# Patient Record
Sex: Female | Born: 1950
Health system: Southern US, Community
[De-identification: ages and names within clinical notes are randomized; demographics above are authoritative.]

## PROBLEM LIST (undated history)

## (undated) DIAGNOSIS — F32A Depression, unspecified: Secondary | ICD-10-CM

## (undated) DIAGNOSIS — F329 Major depressive disorder, single episode, unspecified: Secondary | ICD-10-CM

## (undated) DIAGNOSIS — I10 Essential (primary) hypertension: Secondary | ICD-10-CM

## (undated) DIAGNOSIS — E669 Obesity, unspecified: Secondary | ICD-10-CM

## (undated) DIAGNOSIS — E785 Hyperlipidemia, unspecified: Secondary | ICD-10-CM

## (undated) HISTORY — DX: Depression, unspecified: F32.A

## (undated) HISTORY — DX: Hyperlipidemia, unspecified: E78.5

## (undated) HISTORY — DX: Obesity, unspecified: E66.9

## (undated) HISTORY — DX: Major depressive disorder, single episode, unspecified: F32.9

---

## 1999-07-14 ENCOUNTER — Other Ambulatory Visit: Admission: RE | Admit: 1999-07-14 | Discharge: 1999-07-14 | Payer: Self-pay | Admitting: Obstetrics and Gynecology

## 1999-08-04 ENCOUNTER — Encounter: Payer: Self-pay | Admitting: Obstetrics and Gynecology

## 1999-08-04 ENCOUNTER — Encounter: Admission: RE | Admit: 1999-08-04 | Discharge: 1999-08-04 | Payer: Self-pay | Admitting: Obstetrics and Gynecology

## 2007-02-24 ENCOUNTER — Emergency Department (HOSPITAL_COMMUNITY): Admission: EM | Admit: 2007-02-24 | Discharge: 2007-02-24 | Payer: Self-pay | Admitting: Family Medicine

## 2007-09-29 ENCOUNTER — Emergency Department (HOSPITAL_COMMUNITY): Admission: EM | Admit: 2007-09-29 | Discharge: 2007-09-29 | Payer: Self-pay | Admitting: Family Medicine

## 2007-12-02 ENCOUNTER — Ambulatory Visit: Payer: Self-pay | Admitting: Internal Medicine

## 2007-12-02 DIAGNOSIS — F329 Major depressive disorder, single episode, unspecified: Secondary | ICD-10-CM

## 2007-12-02 DIAGNOSIS — F32A Depression, unspecified: Secondary | ICD-10-CM | POA: Insufficient documentation

## 2007-12-08 ENCOUNTER — Telehealth: Payer: Self-pay | Admitting: Internal Medicine

## 2007-12-08 LAB — CONVERTED CEMR LAB
ALT: 17 units/L (ref 0–35)
AST: 18 units/L (ref 0–37)
BUN: 18 mg/dL (ref 6–23)
CO2: 29 meq/L (ref 19–32)
Calcium: 9.3 mg/dL (ref 8.4–10.5)
Chloride: 104 meq/L (ref 96–112)
Cholesterol: 188 mg/dL (ref 0–200)
Creatinine, Ser: 0.8 mg/dL (ref 0.4–1.2)
GFR calc Af Amer: 95 mL/min
GFR calc non Af Amer: 79 mL/min
Glucose, Bld: 124 mg/dL — ABNORMAL HIGH (ref 70–99)
HDL: 30.4 mg/dL — ABNORMAL LOW (ref >39.0)
Hgb A1c MFr Bld: 7 % — ABNORMAL HIGH (ref 4.6–6.0)
LDL Cholesterol: 123 mg/dL — ABNORMAL HIGH (ref 0–99)
Potassium: 4.6 meq/L (ref 3.5–5.1)
Sodium: 139 meq/L (ref 135–145)
Total CHOL/HDL Ratio: 6.2
Triglycerides: 174 mg/dL — ABNORMAL HIGH (ref 0–149)
VLDL: 35 mg/dL (ref 0–40)

## 2007-12-15 ENCOUNTER — Encounter: Payer: Self-pay | Admitting: Internal Medicine

## 2007-12-15 ENCOUNTER — Encounter: Admission: RE | Admit: 2007-12-15 | Discharge: 2007-12-23 | Payer: Self-pay | Admitting: Internal Medicine

## 2007-12-19 ENCOUNTER — Telehealth: Payer: Self-pay | Admitting: Internal Medicine

## 2008-02-25 ENCOUNTER — Emergency Department (HOSPITAL_COMMUNITY): Admission: EM | Admit: 2008-02-25 | Discharge: 2008-02-25 | Payer: Self-pay | Admitting: Family Medicine

## 2008-03-08 ENCOUNTER — Encounter: Payer: Self-pay | Admitting: Internal Medicine

## 2008-03-29 ENCOUNTER — Ambulatory Visit: Payer: Self-pay | Admitting: Internal Medicine

## 2008-03-29 LAB — CONVERTED CEMR LAB
ALT: 15 units/L (ref 0–35)
AST: 16 units/L (ref 0–37)
BUN: 19 mg/dL (ref 6–23)
CO2: 29 meq/L (ref 19–32)
Calcium: 9.1 mg/dL (ref 8.4–10.5)
Chloride: 106 meq/L (ref 96–112)
Cholesterol: 216 mg/dL (ref 0–200)
Creatinine, Ser: 1.1 mg/dL (ref 0.4–1.2)
Direct LDL: 148.9 mg/dL
GFR calc Af Amer: 66 mL/min
GFR calc non Af Amer: 54 mL/min
Glucose, Bld: 127 mg/dL — ABNORMAL HIGH (ref 70–99)
HDL: 36.2 mg/dL — ABNORMAL LOW (ref >39.0)
Hgb A1c MFr Bld: 6.5 % — ABNORMAL HIGH (ref 4.6–6.0)
Potassium: 4.8 meq/L (ref 3.5–5.1)
Sodium: 139 meq/L (ref 135–145)
Total CHOL/HDL Ratio: 6
Triglycerides: 107 mg/dL (ref 0–149)
VLDL: 21 mg/dL (ref 0–40)

## 2008-04-01 ENCOUNTER — Ambulatory Visit: Payer: Self-pay | Admitting: Internal Medicine

## 2008-08-09 ENCOUNTER — Ambulatory Visit: Payer: Self-pay | Admitting: Internal Medicine

## 2008-08-09 LAB — CONVERTED CEMR LAB
ALT: 17 units/L (ref 0–35)
AST: 18 units/L (ref 0–37)
Albumin: 3.6 g/dL (ref 3.5–5.2)
Alkaline Phosphatase: 67 units/L (ref 39–117)
BUN: 18 mg/dL (ref 6–23)
Bilirubin, Direct: 0.1 mg/dL (ref 0.0–0.3)
CO2: 30 meq/L (ref 19–32)
Calcium: 9 mg/dL (ref 8.4–10.5)
Chloride: 105 meq/L (ref 96–112)
Cholesterol: 216 mg/dL (ref 0–200)
Creatinine, Ser: 0.9 mg/dL (ref 0.4–1.2)
Direct LDL: 155.4 mg/dL
GFR calc Af Amer: 83 mL/min
GFR calc non Af Amer: 69 mL/min
Glucose, Bld: 150 mg/dL — ABNORMAL HIGH (ref 70–99)
HDL: 43.9 mg/dL (ref >39.0)
Hgb A1c MFr Bld: 6.4 % — ABNORMAL HIGH (ref 4.6–6.0)
Potassium: 4.3 meq/L (ref 3.5–5.1)
Sodium: 140 meq/L (ref 135–145)
Total Bilirubin: 0.6 mg/dL (ref 0.3–1.2)
Total CHOL/HDL Ratio: 4.9
Total Protein: 6.7 g/dL (ref 6.0–8.3)
Triglycerides: 110 mg/dL (ref 0–149)
VLDL: 22 mg/dL (ref 0–40)

## 2008-08-17 ENCOUNTER — Ambulatory Visit: Payer: Self-pay | Admitting: Internal Medicine

## 2008-08-24 ENCOUNTER — Encounter: Payer: Self-pay | Admitting: Internal Medicine

## 2008-08-24 ENCOUNTER — Telehealth: Payer: Self-pay | Admitting: Internal Medicine

## 2008-09-09 ENCOUNTER — Ambulatory Visit: Payer: Self-pay | Admitting: Internal Medicine

## 2008-09-10 LAB — CONVERTED CEMR LAB
CRP, High Sensitivity: 4 (ref 0.00–5.00)
Sed Rate: 17 mm/hr (ref 0–22)

## 2008-11-29 ENCOUNTER — Ambulatory Visit: Payer: Self-pay | Admitting: Internal Medicine

## 2008-11-29 LAB — CONVERTED CEMR LAB
ALT: 15 units/L (ref 0–35)
AST: 21 units/L (ref 0–37)
Albumin: 3.6 g/dL (ref 3.5–5.2)
Alkaline Phosphatase: 63 units/L (ref 39–117)
BUN: 11 mg/dL (ref 6–23)
Basophils Absolute: 0 10*3/uL (ref 0.0–0.1)
Basophils Relative: 0.8 % (ref 0.0–3.0)
Bilirubin Urine: NEGATIVE
Bilirubin, Direct: 0.1 mg/dL (ref 0.0–0.3)
Blood in Urine, dipstick: NEGATIVE
CO2: 27 meq/L (ref 19–32)
Calcium: 8.8 mg/dL (ref 8.4–10.5)
Chloride: 107 meq/L (ref 96–112)
Cholesterol: 194 mg/dL (ref 0–200)
Creatinine, Ser: 0.9 mg/dL (ref 0.4–1.2)
Creatinine,U: 69.8 mg/dL
Eosinophils Absolute: 0.2 10*3/uL (ref 0.0–0.7)
Eosinophils Relative: 3.9 % (ref 0.0–5.0)
GFR calc non Af Amer: 68.42 mL/min (ref >60)
Glucose, Bld: 146 mg/dL — ABNORMAL HIGH (ref 70–99)
Glucose, Urine, Semiquant: NEGATIVE
HCT: 39 % (ref 36.0–46.0)
HDL: 34.2 mg/dL — ABNORMAL LOW (ref >39.00)
Hemoglobin: 13.1 g/dL (ref 12.0–15.0)
Hgb A1c MFr Bld: 6.5 % (ref 4.6–6.5)
Ketones, urine, test strip: NEGATIVE
LDL Cholesterol: 133 mg/dL — ABNORMAL HIGH (ref 0–99)
Lymphocytes Relative: 40 % (ref 12.0–46.0)
Lymphs Abs: 2.3 10*3/uL (ref 0.7–4.0)
MCHC: 33.5 g/dL (ref 30.0–36.0)
MCV: 89.3 fL (ref 78.0–100.0)
Microalb Creat Ratio: 1.4 mg/g (ref 0.0–30.0)
Microalb, Ur: 0.1 mg/dL (ref 0.0–1.9)
Monocytes Absolute: 0.4 10*3/uL (ref 0.1–1.0)
Monocytes Relative: 6.6 % (ref 3.0–12.0)
Neutro Abs: 2.8 10*3/uL (ref 1.4–7.7)
Neutrophils Relative %: 48.7 % (ref 43.0–77.0)
Nitrite: NEGATIVE
Platelets: 216 10*3/uL (ref 150.0–400.0)
Potassium: 4.7 meq/L (ref 3.5–5.1)
Protein, U semiquant: NEGATIVE
RBC: 4.36 M/uL (ref 3.87–5.11)
RDW: 11.6 % (ref 11.5–14.6)
Sodium: 141 meq/L (ref 135–145)
Specific Gravity, Urine: 1.015
TSH: 2 microintl units/mL (ref 0.35–5.50)
Total Bilirubin: 0.8 mg/dL (ref 0.3–1.2)
Total CHOL/HDL Ratio: 6
Total Protein: 6.3 g/dL (ref 6.0–8.3)
Triglycerides: 135 mg/dL (ref 0.0–149.0)
Urobilinogen, UA: 0.2
VLDL: 27 mg/dL (ref 0.0–40.0)
WBC: 5.7 10*3/uL (ref 4.5–10.5)
pH: 5.5

## 2008-12-06 ENCOUNTER — Ambulatory Visit: Payer: Self-pay | Admitting: Internal Medicine

## 2009-02-21 ENCOUNTER — Emergency Department (HOSPITAL_COMMUNITY): Admission: EM | Admit: 2009-02-21 | Discharge: 2009-02-21 | Payer: Self-pay | Admitting: Family Medicine

## 2010-03-07 ENCOUNTER — Telehealth: Payer: Self-pay | Admitting: Internal Medicine

## 2010-03-16 ENCOUNTER — Ambulatory Visit: Payer: Self-pay | Admitting: Internal Medicine

## 2010-03-21 ENCOUNTER — Encounter: Payer: Self-pay | Admitting: Internal Medicine

## 2010-03-21 LAB — CONVERTED CEMR LAB
ALT: 16 units/L (ref 0–35)
AST: 14 units/L (ref 0–37)
Albumin: 3.6 g/dL (ref 3.5–5.2)
Alkaline Phosphatase: 75 units/L (ref 39–117)
BUN: 18 mg/dL (ref 6–23)
Basophils Absolute: 0.1 10*3/uL (ref 0.0–0.1)
Basophils Relative: 0.7 % (ref 0.0–3.0)
Bilirubin, Direct: 0.1 mg/dL (ref 0.0–0.3)
CO2: 29 meq/L (ref 19–32)
Calcium: 8.9 mg/dL (ref 8.4–10.5)
Chloride: 108 meq/L (ref 96–112)
Cholesterol: 233 mg/dL — ABNORMAL HIGH (ref 0–200)
Creatinine, Ser: 0.8 mg/dL (ref 0.4–1.2)
Direct LDL: 172.1 mg/dL
Eosinophils Absolute: 0.3 10*3/uL (ref 0.0–0.7)
Eosinophils Relative: 4.3 % (ref 0.0–5.0)
GFR calc non Af Amer: 73.76 mL/min (ref >60)
Glucose, Bld: 229 mg/dL — ABNORMAL HIGH (ref 70–99)
HCT: 37.8 % (ref 36.0–46.0)
HDL: 43.5 mg/dL (ref >39.00)
Hemoglobin: 13.2 g/dL (ref 12.0–15.0)
Hgb A1c MFr Bld: 8.4 % — ABNORMAL HIGH (ref 4.6–6.5)
Lymphocytes Relative: 35 % (ref 12.0–46.0)
Lymphs Abs: 2.6 10*3/uL (ref 0.7–4.0)
MCHC: 34.8 g/dL (ref 30.0–36.0)
MCV: 88.9 fL (ref 78.0–100.0)
Monocytes Absolute: 0.4 10*3/uL (ref 0.1–1.0)
Monocytes Relative: 5.6 % (ref 3.0–12.0)
Neutro Abs: 4.1 10*3/uL (ref 1.4–7.7)
Neutrophils Relative %: 54.4 % (ref 43.0–77.0)
Platelets: 210 10*3/uL (ref 150.0–400.0)
Potassium: 4.6 meq/L (ref 3.5–5.1)
RBC: 4.25 M/uL (ref 3.87–5.11)
RDW: 13.1 % (ref 11.5–14.6)
Sodium: 143 meq/L (ref 135–145)
TSH: 2.29 microintl units/mL (ref 0.35–5.50)
Total Bilirubin: 0.5 mg/dL (ref 0.3–1.2)
Total CHOL/HDL Ratio: 5
Total Protein: 6.4 g/dL (ref 6.0–8.3)
Triglycerides: 119 mg/dL (ref 0.0–149.0)
VLDL: 23.8 mg/dL (ref 0.0–40.0)
WBC: 7.4 10*3/uL (ref 4.5–10.5)

## 2010-03-27 ENCOUNTER — Encounter: Payer: Self-pay | Admitting: Internal Medicine

## 2010-03-27 ENCOUNTER — Encounter: Admission: RE | Admit: 2010-03-27 | Discharge: 2010-05-25 | Payer: Self-pay | Admitting: Internal Medicine

## 2010-03-30 ENCOUNTER — Encounter: Payer: Self-pay | Admitting: Internal Medicine

## 2010-07-05 ENCOUNTER — Encounter: Payer: Self-pay | Admitting: Family Medicine

## 2010-07-05 ENCOUNTER — Ambulatory Visit: Payer: Self-pay | Admitting: Anesthesiology

## 2010-07-26 ENCOUNTER — Ambulatory Visit: Payer: Self-pay | Admitting: Internal Medicine

## 2010-07-26 LAB — CONVERTED CEMR LAB
ALT: 19 units/L (ref 0–35)
AST: 20 units/L (ref 0–37)
Albumin: 3.8 g/dL (ref 3.5–5.2)
Alkaline Phosphatase: 75 units/L (ref 39–117)
BUN: 17 mg/dL (ref 6–23)
Bilirubin, Direct: 0.1 mg/dL (ref 0.0–0.3)
Blood Glucose, Fingerstick: 222
CO2: 26 meq/L (ref 19–32)
Calcium: 8.9 mg/dL (ref 8.4–10.5)
Chloride: 104 meq/L (ref 96–112)
Cholesterol: 212 mg/dL — ABNORMAL HIGH (ref 0–200)
Creatinine, Ser: 0.8 mg/dL (ref 0.4–1.2)
Direct LDL: 148.3 mg/dL
GFR calc non Af Amer: 74.7 mL/min (ref >60)
Glucose, Bld: 212 mg/dL — ABNORMAL HIGH (ref 70–99)
HDL: 42.2 mg/dL (ref >39.00)
Potassium: 4.5 meq/L (ref 3.5–5.1)
Sodium: 139 meq/L (ref 135–145)
TSH: 2.46 microintl units/mL (ref 0.35–5.50)
Total Bilirubin: 0.3 mg/dL (ref 0.3–1.2)
Total CHOL/HDL Ratio: 5
Total Protein: 6.7 g/dL (ref 6.0–8.3)
Triglycerides: 153 mg/dL — ABNORMAL HIGH (ref 0.0–149.0)
VLDL: 30.6 mg/dL (ref 0.0–40.0)

## 2010-08-11 ENCOUNTER — Telehealth: Payer: Self-pay | Admitting: Internal Medicine

## 2010-09-26 ENCOUNTER — Ambulatory Visit
Admission: RE | Admit: 2010-09-26 | Discharge: 2010-09-26 | Payer: Self-pay | Source: Home / Self Care | Attending: Internal Medicine | Admitting: Internal Medicine

## 2010-09-26 ENCOUNTER — Other Ambulatory Visit: Payer: Self-pay | Admitting: Internal Medicine

## 2010-09-26 LAB — HEPATIC FUNCTION PANEL
ALT: 23 U/L (ref 0–35)
AST: 19 U/L (ref 0–37)
Albumin: 3.7 g/dL (ref 3.5–5.2)
Total Bilirubin: 0.2 mg/dL — ABNORMAL LOW (ref 0.3–1.2)
Total Protein: 6.6 g/dL (ref 6.0–8.3)

## 2010-09-26 LAB — BASIC METABOLIC PANEL
BUN: 18 mg/dL (ref 6–23)
Calcium: 8.7 mg/dL (ref 8.4–10.5)
GFR: 76.78 mL/min (ref >60.00)
Glucose, Bld: 315 mg/dL — ABNORMAL HIGH (ref 70–99)

## 2010-09-26 LAB — CBC WITH DIFFERENTIAL/PLATELET
Basophils Absolute: 0 10*3/uL (ref 0.0–0.1)
HCT: 39.6 % (ref 36.0–46.0)
Lymphs Abs: 3.4 10*3/uL (ref 0.7–4.0)
Monocytes Relative: 5.4 % (ref 3.0–12.0)
Platelets: 199 10*3/uL (ref 150.0–400.0)
RDW: 12.9 % (ref 11.5–14.6)

## 2010-09-26 NOTE — Progress Notes (Signed)
Summary: Pt req refill of Citalopram Hydrobromide 20mg . Has ov 03/16/10`  Phone Note Refill Request Call back at Home Phone [phone] Call back at [phone] Work Message from:  Patient on March 07, 2010 1:47 PM  Refills Requested: Medication #1:  CITALOPRAM HYDROBROMIDE 20 MG TABS take one tablet by mouth daily--NEEDS OFFICE VISIT FOR ADDITIONAL REFILLS   Dosage confirmed as above?Dosage Confirmed   Supply Requested: 3 months Pls call in to [facility] [phone]       Method Requested: Telephone to Pharmacy Next Appointment Scheduled: 03/16/10 Initial call taken by: Lucy Antigua,  March 07, 2010 1:49 PM  Follow-up for Phone Call        Please advise refill? Last refill request stated no refills until office visit? Follow-up by: Josph Macho RMA,  March 07, 2010 2:21 PM  Additional Follow-up for Phone Call Additional follow up Details #1::        needs OV can give #14.  The office visit will be to address this refill only Additional Follow-up by: Birdie Sons MD,  March 07, 2010 2:27 PM    Prescriptions: CITALOPRAM HYDROBROMIDE 20 MG TABS (CITALOPRAM HYDROBROMIDE) take one tablet by mouth daily--NEEDS OFFICE VISIT FOR ADDITIONAL REFILLS  #14 x 0   Entered by:   Josph Macho RMA   Authorized by:   Birdie Sons MD   Signed by:   Josph Macho RMA on 03/07/2010   Method used:   Faxed to ...       [facility] (retail)       [address] [place], [place]  [address]       Ph: [phone]       Fax: [phone]   RxID:   [id number]   Appended Document: Pt req refill of Citalopram Hydrobromide 20mg . Has ov 03/16/10` Informed pt that #14 was sent to pharmacy.

## 2010-09-26 NOTE — Assessment & Plan Note (Signed)
Summary: med check and refill/pt coming in fasting/cjr   Vital Signs:  Patient profile:   60 year old female Weight:      219 pounds BMI:     33.42 Temp:     98.6 degrees F oral Pulse rate:   72 / minute Pulse rhythm:   regular Resp:     12 per minute BP sitting:   140 / 78  (left arm) Cuff size:   regular  Vitals Entered By: Gladis Riffle, RN (March 16, 2010 8:36 AM) CC: med review and refil, fasting--CBGs higher in AM 180 at home Is Patient Diabetic? Yes   CC:  med review and refil and fasting--CBGs higher in AM 180 at home.  History of Present Illness:  Follow-Up Visit: not seen in quite some time---needs f/u and refills      This is a 60 year old woman who presents for Follow-up visit.  The patient denies chest pain, palpitations, edema, and SOB.  Since the last visit the patient notes no new problems or concerns.  The patient reports taking meds as prescribed.  When questioned about possible medication side effects, the patient notes none.  She admits to overeating--has gained weight  All other systems reviewed and were negative   Preventive Screening-Counseling & Management  Alcohol-Tobacco     Alcohol drinks/day: <1     Smoking Status: quit     Year Quit: 1975  Current Medications (verified): 1)  Citalopram Hydrobromide 20 Mg Tabs (Citalopram Hydrobromide) .... Take One Tablet By Mouth Daily 2)  Flax Seed Oil 1000 Mg Caps (Flaxseed (Linseed)) .... Once Daily 3)  Freestyle Lite Test   Strp (Glucose Blood) .... Use Once Daily As Directed  Allergies (verified): No Known Drug Allergies  Past History:  Past Medical History: Last updated: 12/02/2007 Depression Diabetes mellitus, type II Hyperlipidemia  Past Surgical History: Last updated: 12/02/2007 Denies surgical history  Family History: Last updated: 12/02/2007 Family History Diabetes 1st degree relative-mother  mother alive at 26  Family History of Suicide attempt-father deceased-suicide  Social  History: Last updated: 12/02/2007 Occupation: Divorced x 3 Former Smoker Alcohol use-yes Regular exercise-yes  Risk Factors: Alcohol Use: <1 (03/16/2010) Exercise: yes (12/02/2007)  Risk Factors: Smoking Status: quit (03/16/2010)  Physical Exam  General:  alert and well-developed.   Head:  atraumatic and no abnormalities observed.   Eyes:  pupils equal and pupils round.   Ears:  R ear normal and L ear normal.   Neck:  No deformities, masses, or tenderness noted. Chest Wall:  No deformities, masses, or tenderness noted. Lungs:  normal respiratory effort and no intercostal retractions.   Heart:  normal rate and regular rhythm.   Abdomen:  Bowel sounds positive,abdomen soft and non-tender without masses, organomegaly or hernias noted.  overwight Msk:  No deformity or scoliosis noted of thoracic or lumbar spine.   Neurologic:  cranial nerves II-XII intact and gait normal.   Skin:  turgor normal and color normal.   Psych:  normally interactive and good eye contact.    Diabetes Management Exam:    Eye Exam:       Eye Exam done elsewhere          Date: 02/24/2010          Results: normal-pt's report          Done by: ophthal   Impression & Recommendations:  Problem # 1:  HYPERLIPIDEMIA (ICD-272.4) check labs today Labs Reviewed: SGOT: 21 (11/29/2008)   SGPT: 15 (11/29/2008)  HDL:34.20 (11/29/2008), 43.9 (08/09/2008)  LDL:133 (11/29/2008), DEL (08/09/2008)  Chol:194 (11/29/2008), 216 (08/09/2008)  Trig:135.0 (11/29/2008), 110 (08/09/2008)  Orders: TLB-Lipid Panel (80061-LIPID) TLB-Hepatic/Liver Function Pnl (80076-HEPATIC) Venipuncture (16[address])  Problem # 2:  DIABETES MELLITUS, TYPE II (ICD-250.00) admits to some blurred vision Labs Reviewed: Creat: 0.9 (11/29/2008)     Last Eye Exam: normal-pt's report (02/24/2010) Reviewed HgBA1c results: 6.5 (11/29/2008)  6.4 (08/09/2008)  Orders: TLB-BMP (Basic Metabolic Panel-BMET) (80048-METABOL) TLB-TSH (Thyroid  Stimulating Hormone) (84443-TSH) TLB-CBC Platelet - w/Differential (85025-CBCD) TLB-A1C / Hgb A1C (Glycohemoglobin) (83036-A1C)  Problem # 3:  DEPRESSION (ICD-311) continue meds Her updated medication list for this problem includes:    Citalopram Hydrobromide 20 Mg Tabs (Citalopram hydrobromide) .Marland Kitchen... Take one tablet by mouth daily  Complete Medication List: 1)  Citalopram Hydrobromide 20 Mg Tabs (Citalopram hydrobromide) .... Take one tablet by mouth daily 2)  Flax Seed Oil 1000 Mg Caps (Flaxseed (linseed)) .... Once daily 3)  Freestyle Lite Test Strp (Glucose blood) .... Use once daily as directed  Other Orders: Ophthalmology Referral (Ophthalmology)  Contraindications/Deferment of Procedures/Staging:    Test/Procedure: TD vaccine    Reason for deferment: declined     Test/Procedure: PAP Smear    Reason for deferment: patient declined     Test/Procedure: Mammogram    Reason for deferment: patient declined   Patient Instructions: 1)  Please schedule a follow-up appointment in 4 months. Prescriptions: CITALOPRAM HYDROBROMIDE 20 MG TABS (CITALOPRAM HYDROBROMIDE) take one tablet by mouth daily  #90 x 3   Entered and Authorized by:   Birdie Sons MD   Signed by:   Birdie Sons MD on 03/16/2010   Method used:   Electronically to        [facility]* (retail)       [address]       [address] Shipping/mailing       [place], [place]  [address]       Ph: [phone]       Fax: [phone]   RxID:   [id number]

## 2010-09-26 NOTE — Letter (Signed)
Summary: [facility]  [facility]   Imported By: Maryln Gottron 07/12/2010 11:03:59  _____________________________________________________________________  External Attachment:    Type:   Image     Comment:   External Document

## 2010-09-26 NOTE — Assessment & Plan Note (Signed)
Summary: FUP//CCM   Vital Signs:  Patient profile:   60 year old female Weight:      215 pounds Temp:     98.8 degrees F oral Pulse rate:   92 / minute Pulse rhythm:   regular BP sitting:   140 / 80  (left arm) Cuff size:   large  Vitals Entered By: Alfred Levins, CMA (July 26, 2010 8:18 AM)  Serial Vital Signs/Assessments:  Time      Position  BP       Pulse  Resp  Temp     By                     132/80                         Birdie Sons MD  CC: diabetes f/u CBG Result 222   CC:  diabetes f/u.  History of Present Illness:  Follow-Up Visit      This is a 60 year old woman who presents for Follow-up visit.  The patient denies chest pain and palpitations.  Since the last visit the patient notes no new problems or concerns.  The patient reports taking meds as prescribed, not monitoring BP, and not monitoring blood sugars.  When questioned about possible medication side effects, the patient notes none.   she has lost a few pounds but admits to a poor diet.   All other systems reviewed and were negative except she admits to a great deal of stress.  Current Problems (verified): 1)  Iritis  (ICD-364.3) 2)  Hyperlipidemia  (ICD-272.4) 3)  Diabetes Mellitus, Type II  (ICD-250.00) 4)  Depression  (ICD-311)  Current Medications (verified): 1)  Citalopram Hydrobromide 20 Mg Tabs (Citalopram Hydrobromide) .... Take One Tablet By Mouth Daily 2)  Freestyle Lite Test   Strp (Glucose Blood) .... Use Once Daily As Directed 3)  Sensa Complete .... Once Daily 4)  Cyanocobalamin 2500 Mcg Subl (Cyanocobalamin) .... Once Daily  Allergies (verified): No Known Drug Allergies  Past History:  Past Medical History: Depression Diabetes mellitus, type II Hyperlipidemia Hypertension  Physical Exam  General:  overweight female in no acute distress. HEENT exam atraumatic, normocephalic. Extraocular muscles intact. Neck is supple without lymphadenopathy. Chest clear to auscultation  cardiac exam S1-S2 are regular. Abdominal exam overweight, to bowel sounds, soft and nontender. No hepatomegaly. Extremities no clubbing cyanosis or edema neurologic exam alert and oriented gait is normal.   Impression & Recommendations:  Problem # 1:  HYPERLIPIDEMIA (ICD-272.4) she will certainly need treatment. I'll check laboratories today. She understands the side effects of statin drugs. I will likely [place] tomorrow. Orders: Fingerstick (14782) TLB-Lipid Panel (80061-LIPID) TLB-BMP (Basic Metabolic Panel-BMET) (80048-METABOL) TLB-Hepatic/Liver Function Pnl (80076-HEPATIC) TLB-TSH (Thyroid Stimulating Hormone) (84443-TSH)  Problem # 2:  DIABETES MELLITUS, TYPE II (ICD-250.00) we'll check lab work today. Look sugar in the office today was 222. She'll most or any metformin. She understands the side effects. I'll call that inaccuracy the laboratory. Orders: Capillary Blood Glucose/CBG (82948) Fingerstick (36416) TLB-Lipid Panel (80061-LIPID) TLB-BMP (Basic Metabolic Panel-BMET) (80048-METABOL) TLB-Hepatic/Liver Function Pnl (80076-HEPATIC) TLB-TSH (Thyroid Stimulating Hormone) (84443-TSH)  Problem # 3:  IRITIS (ICD-364.3) regular f/u with ophthalmology.   Problem # 4:  HYPERTENSION (ICD-401.9) Assessment: New discussed with patient. She needs to lose weight. She is to monitor blood pressure. She may end up on blood pressure medication as well. I think all of her issues would resolve with aggressive  weight loss.  Complete Medication List: 1)  Sertraline Hcl 100 Mg Tabs (Sertraline hcl) .Marland Kitchen.. 1 by mouth daily 2)  Freestyle Lite Test Strp (Glucose blood) .... Use once daily as directed 3)  Sensa Complete  .... Once daily 4)  Cyanocobalamin 2500 Mcg Subl (Cyanocobalamin) .... Once daily  Patient Instructions: 1)  Please schedule a follow-up appointment in 4 months. Prescriptions: SERTRALINE HCL 100 MG TABS (SERTRALINE HCL) 1 by mouth daily  #90 x 3   Entered and Authorized by:    Birdie Sons MD   Signed by:   Birdie Sons MD on 07/26/2010   Method used:   Electronically to        [facility]* (retail)       [address]       [address] Shipping/mailing       [place], [place]  [address]       Ph: [phone]       Fax: [phone]   RxID:   [id number]    Orders Added: 1)  Capillary Blood Glucose/CBG [82948] 2)  Est. Patient Level IV [20254] 3)  Fingerstick [36416] 4)  TLB-Lipid Panel [80061-LIPID] 5)  TLB-BMP (Basic Metabolic Panel-BMET) [80048-METABOL] 6)  TLB-Hepatic/Liver Function Pnl [80076-HEPATIC] 7)  TLB-TSH (Thyroid Stimulating Hormone) [27062-BJS]

## 2010-09-26 NOTE — Assessment & Plan Note (Signed)
Summary: 4 MONTH ROV/NJR   Vital Signs:  Patient profile:   60 year old female Weight:      215 pounds Temp:     99.0 degrees F oral Pulse rate:   100 / minute BP sitting:   134 / 74  (left arm) Cuff size:   large  Vitals Entered By: Alfred Levins, CMA (July 05, 2010 8:03 AM) CC: diabetes f/u, URI symptoms   History of Present Illness:       This is a 60 year old woman who was here to see Dr. Cato Mulligan for DM f/u but Dr. Cato Mulligan is out of office this am currently.  She presents with URI symptoms for the last 4d, worsening in the last 24h.  The patient complains of nasal congestion, purulent nasal discharge, and sore throat, but denies earache and sick contacts.  The patient denies fever.  Risk factors for Strep sinusitis include unilateral facial pain, unilateral nasal discharge, and poor response to decongestant.    Preventive Screening-Counseling & Management  Alcohol-Tobacco     Smoking Status: never  Problems Prior to Update: 1)  Blurred Vision  (ICD-368.8) 2)  Iritis  (ICD-364.3) 3)  Hyperlipidemia  (ICD-272.4) 4)  Diabetes Mellitus, Type II  (ICD-250.00) 5)  Depression  (ICD-311)  Medications Prior to Update: 1)  Citalopram Hydrobromide 20 Mg Tabs (Citalopram Hydrobromide) .... Take One Tablet By Mouth Daily 2)  Flax Seed Oil 1000 Mg Caps (Flaxseed (Linseed)) .... Once Daily 3)  Freestyle Lite Test   Strp (Glucose Blood) .... Use Once Daily As Directed  Current Medications (verified): 1)  Citalopram Hydrobromide 20 Mg Tabs (Citalopram Hydrobromide) .... Take One Tablet By Mouth Daily 2)  Freestyle Lite Test   Strp (Glucose Blood) .... Use Once Daily As Directed  Allergies (verified): No Known Drug Allergies  Past History:  Past Medical History: Last updated: 12/02/2007 Depression Diabetes mellitus, type II Hyperlipidemia  Past Surgical History: Last updated: 12/02/2007 Denies surgical history  Family History: Last updated: 12/02/2007 Family History  Diabetes 1st degree relative-mother  mother alive at 53  Family History of Suicide attempt-father deceased-suicide  Social History: Last updated: 12/02/2007 Occupation: Divorced x 3 Former Smoker Alcohol use-yes Regular exercise-yes  Social History: Smoking Status:  never  Review of Systems       no SOB, wheeze, or CP.  Otherwise, see HPI.  Physical Exam  General:  VS: noted, all normal. Gen: Alert, well appearing, oriented x 4. HEENT: Scalp without lesions or hair loss.  Ears: EACs clear, normal epithelium.  TMs with good light reflex and landmarks bilaterally.  Eyes: no injection, icteris, swelling, or exudate.  EOMI, PERRLA. Nose: injected mucosa, purulent d/c on left.  Left sided facial swelling-subtle.  Left sided maxillary sinus TTP.   Mouth: lips without lesion/swelling.  Oral mucosa pink and moist.  Dentition intact and without obvious caries or gingival swelling.  Oropharynx without erythema, exudate, or swelling.  Some PND is noted. Neck: supple.  No lymphadenopathy, thyromegaly, or mass.  Mild TTP diffusely in anterior neck. Chest: symmetric expansion, with nonlabored respirations.  Clear and equal breath sounds in all lung fields.   CV: RRR, no m/r/g.  Peripheral pulses 2+/symmetric. EXT: no clubbing, cyanosis, or edema.     Impression & Recommendations:  Problem # 1:  ACUTE SINUSITIS, UNSPECIFIED (ICD-461.9) Assessment New  She has atypical URI symptoms with distinct left maxillary TTP and purulent d/c in left nostril. Will give augmentin 875mg  two times a day x 10d,  flonase, and she'll get OTC antihistamine and continue neti pot. Asked to f/u for this as needed.  She will reschedule her DM f/u visit with Dr. Cato Mulligan.  Her updated medication list for this problem includes:    Augmentin 875-125 Mg Tabs (Amoxicillin-pot clavulanate) .Marland Kitchen... 1 tab by mouth two times a day x 10d    Nasonex 50 Mcg/act Susp (Mometasone furoate) .Marland Kitchen... 1-2 sprays each nostril once  daily  Problem # 2:  DIABETES MELLITUS, TYPE II (ICD-250.00) Assessment: Comment Only Diet controlled.  Not discussed today.  Patient will reschedule her f/u appt for this problem with her primary MD, Dr. Cato Mulligan.  Medications Added to Medication List This Visit: 1)  Augmentin 875-125 Mg Tabs (Amoxicillin-pot clavulanate) .Marland Kitchen.. 1 tab by mouth two times a day x 10d 2)  Nasonex 50 Mcg/act Susp (Mometasone furoate) .Marland Kitchen.. 1-2 sprays each nostril once daily  Complete Medication List: 1)  Citalopram Hydrobromide 20 Mg Tabs (Citalopram hydrobromide) .... Take one tablet by mouth daily 2)  Freestyle Lite Test Strp (Glucose blood) .... Use once daily as directed 3)  Augmentin 875-125 Mg Tabs (Amoxicillin-pot clavulanate) .Marland Kitchen.. 1 tab by mouth two times a day x 10d 4)  Nasonex 50 Mcg/act Susp (Mometasone furoate) .Marland Kitchen.. 1-2 sprays each nostril once daily  Patient Instructions: 1)  Please reschedule an appointment with your primary doctor in : 5-7d.  2)  Acute sinusitis symptoms for less than 10 days are not helped by antibiotics.Use warm moist compresses, and over the counter decongestants ( only as directed). Call if no improvement in 5-7 days, sooner if increasing pain, fever, or new symptoms. Prescriptions: NASONEX 50 MCG/ACT SUSP (MOMETASONE FUROATE) 1-2 sprays each nostril once daily  #1 x 1   Entered by:   Michell Heinrich M.D.   Authorized by:   Birdie Sons MD   Signed by:   Michell Heinrich M.D. on 07/05/2010   Method used:   Print then Give to Patient   RxID:   [id number] AUGMENTIN 875-125 MG TABS (AMOXICILLIN-POT CLAVULANATE) 1 tab by mouth two times a day x 10d  #20 x 0   Entered by:   Michell Heinrich M.D.   Authorized by:   Birdie Sons MD   Signed by:   Michell Heinrich M.D. on 07/05/2010   Method used:   Print then Give to Patient   RxID:   [id number]    Orders Added: 1)  Est. Patient Level III [84696]

## 2010-09-26 NOTE — Miscellaneous (Signed)
Summary: Orders Update  Clinical Lists Changes  Orders: Added new Referral order of Nutrition Referral (Nutrition) - Signed 

## 2010-09-27 ENCOUNTER — Other Ambulatory Visit: Payer: Self-pay | Admitting: Internal Medicine

## 2010-09-28 ENCOUNTER — Telehealth: Payer: Self-pay | Admitting: *Deleted

## 2010-09-28 DIAGNOSIS — E119 Type 2 diabetes mellitus without complications: Secondary | ICD-10-CM

## 2010-09-28 NOTE — Progress Notes (Signed)
  Phone Note Call from Patient Call back at Home Phone [phone]   Caller: Patient Call For: Birdie Sons MD Summary of Call: Is asking if Dr. Cato Mulligan meant to put her on Metformin? Initial call taken by: [facility] CMA AAMA,  August 11, 2010 4:30 PM  Follow-up for Phone Call        yes Follow-up by: Birdie Sons MD,  August 13, 2010 6:44 PM  Additional Follow-up for Phone Call Additional follow up Details #1::        [facility]...Marland KitchenMarland KitchenMarland Kitchenneeds instructions, please. Additional Follow-up by: Lynann Beaver CMA AAMA,  August 14, 2010 8:33 AM    New/Updated Medications: METFORMIN HCL 500 MG TABS (METFORMIN HCL) Take 1 tab twice daily Prescriptions: METFORMIN HCL 500 MG TABS (METFORMIN HCL) Take 1 tab twice daily  #180 x 3   Entered by:   Lynann Beaver CMA AAMA   Authorized by:   Birdie Sons MD   Signed by:   Lynann Beaver CMA AAMA on 08/14/2010   Method used:   Electronically to        [facility]* (retail)       [address]       [address] Shipping/mailing       [place], [place]  [address]       Ph: [phone]       Fax: [phone]   RxID:   [id number] METFORMIN HCL 500 MG TABS (METFORMIN HCL) Take 1 tab twice daily  #180 x 3   Entered and Authorized by:   Birdie Sons MD   Signed by:   Birdie Sons MD on 08/14/2010   Method used:   Print then Give to Patient   RxID:   [id number]

## 2010-09-28 NOTE — Telephone Encounter (Signed)
Pt said metformin makes her lips swell.

## 2010-09-29 MED ORDER — GLYBURIDE 5 MG PO TABS: 5.0000 mg | ORAL_TABLET | Freq: Every day | ORAL | Status: AC

## 2010-09-29 NOTE — Telephone Encounter (Signed)
Pt aware, rx called in  

## 2010-09-29 NOTE — Telephone Encounter (Signed)
Instead take glyburide 5 mg by mouth every morning with breakfast.

## 2010-09-29 NOTE — Letter (Signed)
[facility]   Imported By: Maryln Gottron 06/07/2010 11:25:43  _____________________________________________________________________  External Attachment:    Type:   Image     Comment:   External Document

## 2010-10-04 NOTE — Assessment & Plan Note (Signed)
Summary: pain in abdomen/cjr   Vital Signs:  Patient profile:   60 year old female Weight:      213 pounds Temp:     99.4 degrees F oral Pulse rate:   92 / minute BP sitting:   130 / 80  (left arm) Cuff size:   large  Vitals Entered By: Alfred Levins, CMA (September 26, 2010 11:42 AM) CC: abd pain, stressed out   CC:  abd pain and stressed out.  History of Present Illness: main complaint is "stressed out" she has been given name of susan bond to make appt she has been self medicating---mixing and matching sertraline and citalopram  abdominal pain for two days. diffuse and mild. no fever or chills, normal appetite and normal BMs . no nausea  ROS: anxious and worried, no other specific complaints  Allergies (verified): No Known Drug Allergies  Physical Exam  General:  alert and well-developed.   Head:  normocephalic and atraumatic.   Eyes:  pupils equal and pupils round.  nonicteric Ears:  R ear normal and L ear normal.   Neck:  No deformities, masses, or tenderness noted. Lungs:  normal respiratory effort and no intercostal retractions.   Heart:  normal rate and regular rhythm.   Abdomen:  soft, non-tender, and normal bowel sounds.     Impression & Recommendations:  Problem # 1:  ABDOMINAL PAIN (ICD-789.00) will check labs she does not have an acute abdomen Orders: Venipuncture (32951) Specimen Handling (88416) TLB-BMP (Basic Metabolic Panel-BMET) (80048-METABOL) TLB-CBC Platelet - w/Differential (85025-CBCD) TLB-Hepatic/Liver Function Pnl (80076-HEPATIC)  Problem # 2:  DEPRESSION (ICD-311) discussed at length advised her to never self medicate d/c citalopram increease sertraline see me 4 weeks The following medications were removed from the medication list:    Citalopram Hydrobromide 20 Mg Tabs (Citalopram hydrobromide) .Marland Kitchen... Take 1/2  tablet by mouth daily (self medicating) Her updated medication list for this problem includes:    Sertraline Hcl 100 Mg  Tabs (Sertraline hcl) .Marland Kitchen... 1 and 1/2  by mouth daily  Complete Medication List: 1)  Sertraline Hcl 100 Mg Tabs (Sertraline hcl) .Marland Kitchen.. 1 and 1/2  by mouth daily 2)  Freestyle Lite Test Strp (Glucose blood) .... Use once daily as directed 3)  Cyanocobalamin 2500 Mcg Subl (Cyanocobalamin) .... Once daily  Patient Instructions: 1)  Please schedule a follow-up appointment in 1 month. Prescriptions: SERTRALINE HCL 100 MG TABS (SERTRALINE HCL) 1 and 1/2  by mouth daily  #135 x 3   Entered and Authorized by:   Birdie Sons MD   Signed by:   Birdie Sons MD on 09/26/2010   Method used:   Electronically to        [facility]* (retail)       [address]       [address] Shipping/mailing       [place], [place]  [address]       Ph: [phone]       Fax: [phone]   RxID:   [id number] SERTRALINE HCL 100 MG TABS (SERTRALINE HCL) 1 and 1/2  by mouth daily  #135 x 3   Entered and Authorized by:   Birdie Sons MD   Signed by:   Birdie Sons MD on 09/26/2010   Method used:   Print then Give to Patient   RxID:   [id number] CITALOPRAM HYDROBROMIDE 20 MG TABS (CITALOPRAM HYDROBROMIDE) take 1/2  tablet by mouth daily (self medicating)  #90 x 3  Entered and Authorized by:   Birdie Sons MD   Signed by:   Birdie Sons MD on 09/26/2010   Method used:   Print then Give to Patient   RxID:   [id number]  note she was not given citalopram Rx---this was input as a medication she was taking at the time of home med rec  Orders Added: 1)  Est. Patient Level IV [14782] 2)  Venipuncture [95621] 3)  Specimen Handling [99000] 4)  TLB-BMP (Basic Metabolic Panel-BMET) [80048-METABOL] 5)  TLB-CBC Platelet - w/Differential [85025-CBCD] 6)  TLB-Hepatic/Liver Function Pnl [80076-HEPATIC]

## 2010-10-17 ENCOUNTER — Ambulatory Visit (INDEPENDENT_AMBULATORY_CARE_PROVIDER_SITE_OTHER): Payer: 59 | Admitting: Licensed Clinical Social Worker

## 2010-10-17 ENCOUNTER — Other Ambulatory Visit: Payer: Self-pay | Admitting: *Deleted

## 2010-10-17 DIAGNOSIS — F4323 Adjustment disorder with mixed anxiety and depressed mood: Secondary | ICD-10-CM

## 2010-10-17 DIAGNOSIS — F329 Major depressive disorder, single episode, unspecified: Secondary | ICD-10-CM

## 2010-10-17 MED ORDER — SERTRALINE HCL 100 MG PO TABS: 100.0000 mg | ORAL_TABLET | Freq: Every day | ORAL | Status: DC

## 2010-11-01 ENCOUNTER — Ambulatory Visit (INDEPENDENT_AMBULATORY_CARE_PROVIDER_SITE_OTHER): Payer: 59 | Admitting: Licensed Clinical Social Worker

## 2010-11-01 DIAGNOSIS — F4323 Adjustment disorder with mixed anxiety and depressed mood: Secondary | ICD-10-CM

## 2011-01-04 ENCOUNTER — Other Ambulatory Visit: Payer: Self-pay | Admitting: *Deleted

## 2011-01-04 MED ORDER — GLUCOSE BLOOD VI STRP: ORAL_STRIP | Status: DC

## 2011-02-21 ENCOUNTER — Inpatient Hospital Stay (INDEPENDENT_AMBULATORY_CARE_PROVIDER_SITE_OTHER)
Admission: RE | Admit: 2011-02-21 | Discharge: 2011-02-21 | Disposition: A | Discharge status: Home / Self Care | Payer: 59 | Source: Ambulatory Visit | Attending: Emergency Medicine | Admitting: Emergency Medicine

## 2011-02-21 DIAGNOSIS — K089 Disorder of teeth and supporting structures, unspecified: Secondary | ICD-10-CM

## 2011-02-26 ENCOUNTER — Encounter: Payer: 59 | Attending: Internal Medicine | Admitting: *Deleted

## 2011-02-26 DIAGNOSIS — Z713 Dietary counseling and surveillance: Secondary | ICD-10-CM | POA: Insufficient documentation

## 2011-02-26 DIAGNOSIS — E119 Type 2 diabetes mellitus without complications: Secondary | ICD-10-CM | POA: Insufficient documentation

## 2011-03-14 ENCOUNTER — Other Ambulatory Visit: Payer: Self-pay | Admitting: *Deleted

## 2011-03-14 MED ORDER — GLUCOSE BLOOD VI STRP: ORAL_STRIP | Status: DC

## 2011-04-02 ENCOUNTER — Encounter: Payer: 59 | Attending: Internal Medicine | Admitting: *Deleted

## 2011-04-02 DIAGNOSIS — E119 Type 2 diabetes mellitus without complications: Secondary | ICD-10-CM | POA: Insufficient documentation

## 2011-04-02 DIAGNOSIS — Z713 Dietary counseling and surveillance: Secondary | ICD-10-CM | POA: Insufficient documentation

## 2011-04-02 NOTE — Progress Notes (Signed)
Medical Nutrition Therapy:  Appt start time:  4:00p  End time:  4:30p.  Assessment:  Primary concerns today: T2DM, Weight Mgmt: Follow up.  Pt here for follow up. Improved FBGs and has lost ~6 lbs since last visit (7/2). Previously 300-400 mg/dL; last week 469-629.  Pt still skipping meals some days and not meeting calorie intake, but making some better choices. Reports appt with CDE at MD office Bonita Quin), who set specific protein recs (14g lunch/21g supper) and instructed pt to talk to MD Lucianne Muss) regarding adding Byetta or Victoza in medicine regimen. Pt to meet with MD on 05/09/11.    MEDICATIONS: B12, Glyburide, Zoloft, prednisoLONE acetate (eye drops), Estroven nutritional supplement   DIETARY INTAKE:  B (10:45 AM): SKIPS or 2 boiled eggs, grits; water S: None L (2:30 PM): 2 veggie dogs w/ buns; water OR [place] steak, mashed potatoes, green beans; water S: Frozen berries, kale chips D (8:45 PM): Burgers and greens   S: Ice pop  Recent physical activity:  No structured exercise noted - "I'm joining the gym today".  Estimated energy needs: 1600 calories 175-180 g carbohydrates (45g at meals and 15g at snacks) 115-120 g protein 42-44 g fat  Progress Towards Goal(s):  Some progress.   Nutritional Diagnosis:  Celebration-2.1 Impaired nutrient utilization related to glucose as evidenced by excessive CHO intake and a recent A1c of 9.7%.    Intervention/Goals:  Follow Diabetes Meal Plan as instructed (Yellow Card)  Eat 3 meals and 3 snacks; No meal skipping.  Limit carbohydrate intake to 45 grams per meal and 15 grams per snack.  Add lean protein foods to all meals/snacks - Aim for 115-120 gm per day.  Monitor glucose levels as instructed by your doctor.  Aim for 20-30 mins of physical activity 3-5 days/week.   Bring food record and glucose log to your next nutrition visit.  Monitoring/Evaluation:  Dietary intake, exercise, A1c, blood glucose, and body weight in 6 week(s).

## 2011-04-02 NOTE — Patient Instructions (Addendum)
Goals:  Follow Diabetes Meal Plan as instructed (Yellow Card)  Eat 3 meals and 3 snacks; No meal skipping.  Limit carbohydrate intake to 45 grams per meal and 15 grams per snack.  Add lean protein foods to all meals/snacks - Aim for 115-120 gm per day.  Monitor glucose levels as instructed by your doctor.  Aim for 20-30 mins of physical activity 3-5 days/week.   Bring food record and glucose log to your next nutrition visit.

## 2011-04-03 ENCOUNTER — Other Ambulatory Visit: Payer: Self-pay | Admitting: *Deleted

## 2011-04-03 ENCOUNTER — Encounter: Payer: Self-pay | Admitting: *Deleted

## 2011-04-03 MED ORDER — GLUCOSE BLOOD VI STRP: ORAL_STRIP | Status: AC

## 2011-05-14 ENCOUNTER — Encounter: Payer: Self-pay | Admitting: *Deleted

## 2011-05-14 ENCOUNTER — Encounter: Payer: 59 | Attending: Internal Medicine | Admitting: *Deleted

## 2011-05-14 DIAGNOSIS — E119 Type 2 diabetes mellitus without complications: Secondary | ICD-10-CM | POA: Insufficient documentation

## 2011-05-14 DIAGNOSIS — Z713 Dietary counseling and surveillance: Secondary | ICD-10-CM | POA: Insufficient documentation

## 2011-05-14 NOTE — Progress Notes (Signed)
Medical Nutrition Therapy:  Appt start time:  4:00p  End time:  4:30p.  Assessment:  Primary concerns today: T2DM, Weight Mgmt: Follow up.  Pt here for follow up with a wt gain of 3.5 lbs from previous visit (03/29/11). Reports recent 2 wk prednisone treatment, resulting in increased cravings and intake of sugar/CHO.  D/C'd last Thursday. Reports FBG 288 mg/dL, after breakfast 161 (2.5 hrs PP), but pt states likely d/t the prednisone.  Stress from work situation is highly decreased at this point; pt is feeling much better.  MEDICATIONS: B12, Glyburide, Zoloft, prednisoLONE acetate (eye drops) - 2 more weeks, Estroven nutritional supplement   DIETARY INTAKE:  "I'm eating terribly"; Breakfast: Premier protein drinks or bowl of fruit with 1 egg.  Increased intake of flavored seltzer water.  Recent physical activity: Joined a gym, but has only gone 1 time d/t recent eye surgery. Walks dog sometimes.   Estimated energy needs: 1600 calories 175-180 g carbohydrates (45g at meals and 15g at snacks) 115-120 g protein 42-44 g fat  Progress Towards Goal(s):  Some progress.   Nutritional Diagnosis:  Hunt-2.1 Impaired nutrient utilization related to  as evidenced by excessive CHO intake and a recent A1c of 9.7%.    Intervention/Goals:  Continue to follow Diabetes Meal Plan as instructed (Yellow Card)  Eat 3 meals and 3 snacks; No meal skipping.  Limit simple sugar/refined carbs and concentrated sweets.   Monitor glucose levels as instructed by your doctor.  Aim for 20-30 mins of physical activity 3-5 days/week.   Bring food record and glucose log to your next nutrition visit.  Monitoring/Evaluation:  Dietary intake, exercise, A1c, blood glucose, and body weight in 6 week(s).

## 2011-05-14 NOTE — Patient Instructions (Addendum)
Goals:  Continue to follow Diabetes Meal Plan as instructed (Yellow Card)  Eat 3 meals and 3 snacks; No meal skipping.  Limit simple sugar/refined carbs and concentrated sweets.   Monitor glucose levels as instructed by your doctor.  Aim for 20-30 mins of physical activity 3-5 days/week.   Bring food record and glucose log to your next nutrition visit.

## 2011-05-15 ENCOUNTER — Encounter: Payer: Self-pay | Admitting: *Deleted

## 2011-11-27 ENCOUNTER — Other Ambulatory Visit: Payer: Self-pay | Admitting: Internal Medicine

## 2012-10-24 ENCOUNTER — Ambulatory Visit (INDEPENDENT_AMBULATORY_CARE_PROVIDER_SITE_OTHER): Payer: 59 | Admitting: Internal Medicine

## 2012-10-24 ENCOUNTER — Telehealth: Payer: Self-pay | Admitting: Internal Medicine

## 2012-10-24 ENCOUNTER — Encounter: Payer: Self-pay | Admitting: Internal Medicine

## 2012-10-24 VITALS — BP 148/78 | HR 76 | Temp 99.1°F | Wt 194.0 lb

## 2012-10-24 DIAGNOSIS — F329 Major depressive disorder, single episode, unspecified: Secondary | ICD-10-CM

## 2012-10-24 DIAGNOSIS — F3289 Other specified depressive episodes: Secondary | ICD-10-CM

## 2012-10-24 DIAGNOSIS — E785 Hyperlipidemia, unspecified: Secondary | ICD-10-CM

## 2012-10-24 DIAGNOSIS — IMO0001 Reserved for inherently not codable concepts without codable children: Secondary | ICD-10-CM

## 2012-10-24 DIAGNOSIS — I10 Essential (primary) hypertension: Secondary | ICD-10-CM

## 2012-10-24 LAB — BASIC METABOLIC PANEL
BUN: 24 mg/dL — ABNORMAL HIGH (ref 6–23)
Calcium: 9.1 mg/dL (ref 8.4–10.5)
GFR: 74.13 mL/min (ref >60.00)
Glucose, Bld: 283 mg/dL — ABNORMAL HIGH (ref 70–99)

## 2012-10-24 LAB — MICROALBUMIN / CREATININE URINE RATIO
Creatinine,U: 125.9 mg/dL
Microalb Creat Ratio: 1.2 mg/g (ref 0.0–30.0)

## 2012-10-24 LAB — HEPATIC FUNCTION PANEL
Albumin: 3.7 g/dL (ref 3.5–5.2)
Total Protein: 6.7 g/dL (ref 6.0–8.3)

## 2012-10-24 LAB — LIPID PANEL
HDL: 42.6 mg/dL (ref >39.00)
Triglycerides: 272 mg/dL — ABNORMAL HIGH (ref 0.0–149.0)

## 2012-10-24 LAB — TSH: TSH: 1.39 u[IU]/mL (ref 0.35–5.50)

## 2012-10-24 MED ORDER — SERTRALINE HCL 100 MG PO TABS: 100.0000 mg | ORAL_TABLET | Freq: Every day | ORAL | Status: DC

## 2012-10-24 MED ORDER — INSULIN PEN NEEDLE 31G X 8 MM MISC: Status: DC

## 2012-10-24 MED ORDER — INSULIN LISPRO 100 UNIT/ML ~~LOC~~ SOLN: 5.0000 [IU] | Freq: Three times a day (TID) | SUBCUTANEOUS | Status: DC

## 2012-10-24 MED ORDER — INSULIN GLARGINE 100 UNIT/ML ~~LOC~~ SOLN: 30.0000 [IU] | Freq: Every day | SUBCUTANEOUS | Status: DC

## 2012-10-24 NOTE — Telephone Encounter (Signed)
Opened in error/kjh 

## 2012-10-24 NOTE — Telephone Encounter (Signed)
The patient called today to report she is having really high blood sugars.  They have been running above 400.  She reports that she feels fine.  She has been on medications in the past but no longer has any since she has not seen Dr. Cato Mulligan in a long time.  She wanted to come in and be seen as a new patient by another doctor since Dr. Cato Mulligan schedule is changing.  She asked about Dr. Fabian Sharp but informed her that Dr. Fabian Sharp is not here this week.  I urged her to be seen at an ER or Urgent Care to get treatment now and lower her bs and then come in later as a new patient.  She refused ER and urgent care.  Explained the importance of being seen by someone ASAP.  She expressed understanding but still refused to be seen by urgent care or ED today.  She will wait to hear from Dr. Fabian Sharp as to whether she can be fit in soon on the schedule as a new patient.  She will see Dr. Thomos Lemons today to start back on Metformin and control bs.

## 2012-10-24 NOTE — Assessment & Plan Note (Signed)
Poorly compliant  62 year old female with uncontrolled type 2 diabetes. She's been experiencing polyuria and polydipsia for last 6 months. Her recent blood sugars have been greater than 500. She requests to restart metformin. I suggest we first evaluated kidney function. Restart Lantus at 30 units at bedtime and Humalog 5 units with meals.  Refer to endocrinologist.  Check BMET, A1c, and microalbumin / Cr ratio.  We had lengthy discussion about microvascular and macrovascular complications of type 2 diabetes.  Reassess in 5 days.

## 2012-10-24 NOTE — Progress Notes (Signed)
  Subjective:    Patient ID: Cassandra Morris, female    DOB: 07/12/1951, 62 y.o.   MRN: [id number]  HPI  62 year old white female with history of uncontrolled type 2 diabetes complains of severe hyperglycemia. Patient also complains of polyuria and polydipsia for the last 6 months. She reports being diabetic for at least 3 years. She was referred to local endocrinologist 2 years ago by Dr. Cato Mulligan. She was started on Lantus but stopped on her own one half years ago. She tried to manage with diet alone. She also tried by [facility] which she could not tolerate.  Patient does not check her blood sugar regularly. Last week she wasn't feeling well and started checking her blood sugars. It was greater than 500.  Her blood sugar this morning was 433.  She admits to poor dietary compliance.  Review of Systems She denies any visual changes, she denies numbness and tingling in her feet. Negative for chest pain or shortness of breath    Past Medical History  Diagnosis Date  . Depression   . Diabetes mellitus     T2DM  . Hyperlipidemia   . Obesity (BMI 30-39.9)     History   Social History  . Marital Status: Divorced    Spouse Name: N/A    Number of Children: N/A  . Years of Education: N/A   Occupational History  . Not on file.   Social History Main Topics  . Smoking status: Former Smoker    Quit date: 03/16/2010  . Smokeless tobacco: Not on file  . Alcohol Use: 0.0 oz/week  . Drug Use: No  . Sexually Active: Not on file   Other Topics Concern  . Not on file   Social History Narrative  . No narrative on file    No past surgical history on file.  Family History  Problem Relation Age of Onset  . Diabetes Mother     No Known Allergies  Current Outpatient Prescriptions on File Prior to Visit  Medication Sig Dispense Refill  . Cyanocobalamin (VITAMIN B-12) 2500 MCG SUBL Place 2 each under the tongue daily.        . sertraline (ZOLOFT) 100 MG tablet Take 1 tablet (100 mg total)  by mouth daily.  90 tablet  2   No current facility-administered medications on file prior to visit.    BP 148/78  Pulse 76  Temp(Src) 99.1 F (37.3 C) (Oral)  Wt 194 lb (87.998 kg)  BMI 29.5 kg/m2    Objective:   Physical Exam  Constitutional: She is oriented to person, place, and time. She appears well-developed and well-nourished.  HENT:  Head: Normocephalic and atraumatic.  Neck: Neck supple.  No carotid bruit  Cardiovascular: Normal rate, regular rhythm and normal heart sounds.   No murmur heard. Pulmonary/Chest: Effort normal and breath sounds normal. She has no wheezes.  Musculoskeletal: She exhibits no edema.  Neurological: She is alert and oriented to person, place, and time. No cranial nerve deficit.  Skin: Skin is warm and dry.  Psychiatric: She has a normal mood and affect. Her behavior is normal.          Assessment & Plan:

## 2012-10-27 ENCOUNTER — Ambulatory Visit: Payer: 59 | Admitting: Family Medicine

## 2012-10-28 ENCOUNTER — Telehealth: Payer: Self-pay | Admitting: Internal Medicine

## 2012-10-28 ENCOUNTER — Encounter: Payer: Self-pay | Admitting: Internal Medicine

## 2012-10-28 ENCOUNTER — Ambulatory Visit (INDEPENDENT_AMBULATORY_CARE_PROVIDER_SITE_OTHER): Payer: 59 | Admitting: Internal Medicine

## 2012-10-28 VITALS — BP 132/64 | Temp 98.9°F | Wt 198.0 lb

## 2012-10-28 DIAGNOSIS — E785 Hyperlipidemia, unspecified: Secondary | ICD-10-CM

## 2012-10-28 DIAGNOSIS — IMO0001 Reserved for inherently not codable concepts without codable children: Secondary | ICD-10-CM

## 2012-10-28 DIAGNOSIS — I10 Essential (primary) hypertension: Secondary | ICD-10-CM

## 2012-10-28 MED ORDER — PRAVASTATIN SODIUM 40 MG PO TABS: 40.0000 mg | ORAL_TABLET | Freq: Every day | ORAL | Status: DC

## 2012-10-28 MED ORDER — BENAZEPRIL HCL 5 MG PO TABS: 5.0000 mg | ORAL_TABLET | Freq: Every day | ORAL | Status: DC

## 2012-10-28 MED ORDER — INSULIN LISPRO 100 UNIT/ML ~~LOC~~ SOLN: SUBCUTANEOUS | Status: DC

## 2012-10-28 NOTE — Telephone Encounter (Signed)
Patient would like to change pcps to Dr. Artist Pais. Please advise.

## 2012-10-28 NOTE — Assessment & Plan Note (Addendum)
Blood sugars significantly improved since restarting insulin therapy. Patient advised to titrate Humalog to 10 units before dinner. Proceed with endocrinology consult. Now that we know her kidney function is normal, consider restarting metformin. Start low-dose benazepril for kidney protection.  Refer to [facility] ophthalmology for diabetic eye exam.

## 2012-10-28 NOTE — Telephone Encounter (Signed)
Patient aware.

## 2012-10-28 NOTE — Telephone Encounter (Signed)
Ok with me 

## 2012-10-28 NOTE — Progress Notes (Signed)
  Subjective:    Patient ID: Cassandra Morris, female    DOB: [birth year], 62 y.o.   MRN: [id number]  HPI  62 year old white female with uncontrolled type 2 diabetes for followup. Patient started on Lantus 30 units at bedtime. She is currently using Humalog 5 units before each meal. Her blood sugars have significantly improved. Her postprandial blood sugars after dinner are usually greater than 200. She denies any hypoglycemic episodes.  Her blood work was reviewed. She has elevated LDL. Her microalbumin/cr ratio was normal.  Review of Systems Negative for polyuria  Past Medical History  Diagnosis Date  . Depression   . Diabetes mellitus     T2DM  . Hyperlipidemia   . Obesity (BMI 30-39.9)     History   Social History  . Marital Status: Divorced    Spouse Name: N/A    Number of Children: N/A  . Years of Education: N/A   Occupational History  . Not on file.   Social History Main Topics  . Smoking status: Former Smoker    Quit date: 03/16/2010  . Smokeless tobacco: Not on file  . Alcohol Use: 0.0 oz/week  . Drug Use: No  . Sexually Active: Not on file   Other Topics Concern  . Not on file   Social History Narrative  . No narrative on file    No past surgical history on file.  Family History  Problem Relation Age of Onset  . Diabetes Mother     No Known Allergies  Current Outpatient Prescriptions on File Prior to Visit  Medication Sig Dispense Refill  . Cyanocobalamin (VITAMIN B-12) 2500 MCG SUBL Place 2 each under the tongue daily.        . insulin glargine (LANTUS SOLOSTAR) 100 UNIT/ML injection Inject 30 Units into the skin at bedtime.  5 pen  1  . Insulin Pen Needle (COMFORT EZ PEN NEEDLES) 31G X 8 MM MISC Use 4 times daily  100 each  5  . sertraline (ZOLOFT) 100 MG tablet Take 1 tablet (100 mg total) by mouth daily.  90 tablet  1   No current facility-administered medications on file prior to visit.    BP 132/64  Temp(Src) 98.9 F (37.2 C) (Oral)   Wt 198 lb (89.812 kg)  BMI 30.11 kg/m2       Objective:   Physical Exam  Constitutional: She appears well-developed and well-nourished.  Cardiovascular: Normal rate, regular rhythm and normal heart sounds.   Pulmonary/Chest: Effort normal and breath sounds normal. She has no wheezes.  Neurological: She is alert. No cranial nerve deficit.          Assessment & Plan:

## 2012-10-28 NOTE — Assessment & Plan Note (Signed)
Start benazepril 5 mg once daily. Monitor electrolytes and kidney function before next office visit.

## 2012-10-28 NOTE — Patient Instructions (Addendum)
Please complete the following lab tests before your next follow up appointment: BMET - 401.9 

## 2012-10-28 NOTE — Assessment & Plan Note (Signed)
Start pravastatin 40 mg once daily.

## 2012-10-29 ENCOUNTER — Ambulatory Visit: Payer: 59 | Admitting: Internal Medicine

## 2012-11-04 ENCOUNTER — Ambulatory Visit (INDEPENDENT_AMBULATORY_CARE_PROVIDER_SITE_OTHER): Payer: 59 | Admitting: Internal Medicine

## 2012-11-04 ENCOUNTER — Encounter: Payer: Self-pay | Admitting: Internal Medicine

## 2012-11-04 VITALS — BP 130/68 | HR 78 | Temp 98.5°F | Resp 10 | Ht 67.5 in | Wt 198.0 lb

## 2012-11-04 DIAGNOSIS — IMO0001 Reserved for inherently not codable concepts without codable children: Secondary | ICD-10-CM

## 2012-11-04 MED ORDER — METFORMIN HCL 1000 MG PO TABS: 1000.0000 mg | ORAL_TABLET | Freq: Two times a day (BID) | ORAL | Status: DC

## 2012-11-04 NOTE — Progress Notes (Signed)
Subjective:     Patient ID: Cassandra Morris, female   DOB: 04/24/1951, 62 y.o.   MRN: [id number]  HPI Ms Coxe is a pleasant 62 year old woman, referred by PCP, Dr. Cathie Hoops, for management of DM2, insulin-dependent, uncontrolled, without complications.  Pt has been diagnosed with DM2 in 2011. She started insulin in 2013, then stopped, and restarted on 10/28/2012. She has been on Byetta, too, last year. She did not tolerate it because of a stomach "fullness" sensation, and she was also afraid of SEs. She is now on a regimen of: - Lantus 30 units each bedtime - Humalog 12-29-08 before meals Her sugars became better controlled after starting mealtime insulin, however her post dinner sugars were >200, therefore her dinner dose has been increased to 10.  Had a low yesterday in pm, at 81 (felt it).   She checks her sugars several times a day (2-8): I reviewed the sugars in her meter for the last week, after her dinner NovoLog insulin has been increased to 10, and they are usually in the 200s before and after breakfast, they drop to the 110 to 190s (yesterday 81 - she felt poorly) before and after lunch and they are very variable before and after dinner. She has one bedtime sugar at 147, with a previous after dinner CBG of 246.  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 - snacks: grazes all the time - pork rinds, celery and blue cheese dressing  She has been on metformin in the past, but this has stopped with the change in her diabetic regimen, apparently not because of abnormal kidney fxn. Last GFR normal, Cr 0.8 last month. No neuropathy. She has an eye exam coming up next month. She has a h/o iritis.  She also has a medical history of hypertension, hyperlipidemia, depression, obesity.  Review of  Systems Constitutional: + weight gain, + fatigue, + subjective hyperthermia, + increased appetite, increased urination, nocturia >1 Eyes: no blurry vision, no xerophthalmia ENT: no sore throat, no nodules palpated in throat, no dysphagia/odynophagia, no hoarseness Cardiovascular: no CP/SOB/palpitations/leg swelling Respiratory: +  cough/SOB Gastrointestinal: no N/V/had D, resolving /C Musculoskeletal: no muscle/joint aches Skin: no rashes Neurological: no tremors/numbness/tingling/dizziness; + tinnitus Psychiatric: + depression/no anxiety  Past Medical History  Diagnosis Date  . Depression   . Diabetes mellitus     T2DM  . Hyperlipidemia   . Obesity (BMI 30-39.9)    History reviewed. No pertinent past surgical history.  History   Social History  . Marital Status: Divorced    Spouse Name: N/A    Number of Children: 1  . Years of Education: N/A   Occupational History  . Not on file.   Social History Main Topics  . Smoking status: Former Smoker    Quit date: 03/16/2010  . Smokeless tobacco: Never Used  . Alcohol Use: 0.0 oz/week  . Drug Use: No  . Sexually Active: Not on file   Other Topics Concern  . Not on file   Social History Narrative   Caffeine use: daily, soda   Regular exercise: joined gym, cut back due to current health issues   Current Outpatient Prescriptions on File Prior to Visit  Medication Sig Dispense Refill  . benazepril (LOTENSIN) 5 MG tablet Take 1 tablet (5 mg total) by mouth  daily.  30 tablet  2  . insulin glargine (LANTUS SOLOSTAR) 100 UNIT/ML injection Inject 30 Units into the skin at bedtime.  5 pen  1  . insulin lispro (HUMALOG KWIKPEN) 100 UNIT/ML injection Use 5-10 units before meals 3 times daily  15 mL  1  . Insulin Pen Needle (COMFORT EZ PEN NEEDLES) 31G X 8 MM MISC Use 4 times daily  100 each  5  . pravastatin (PRAVACHOL) 40 MG tablet Take 1 tablet (40 mg total) by mouth daily.  90 tablet  1  . sertraline (ZOLOFT) 100 MG tablet Take 1  tablet (100 mg total) by mouth daily.  90 tablet  1  . Cyanocobalamin (VITAMIN B-12) 2500 MCG SUBL Place 2 each under the tongue daily.         No current facility-administered medications on file prior to visit.   No Known Allergies  Family History  Problem Relation Age of Onset  . Diabetes Mother    Objective:   Physical Exam BP 130/68  Pulse 78  Temp(Src) 98.5 F (36.9 C) (Oral)  Resp 10  Ht 5' 7.5" (1.715 m)  Wt 198 lb (89.812 kg)  BMI 30.54 kg/m2  SpO2 97% Wt Readings from Last 3 Encounters:  11/04/12 198 lb (89.812 kg)  10/28/12 198 lb (89.812 kg)  10/24/12 194 lb (87.998 kg)  Constitutional: overweight, in NAD Eyes: unequal pupils L>R, EOMI, no exophthalmos ENT: moist mucous membranes, no thyromegaly, no cervical lymphadenopathy Cardiovascular: RRR, No MRG Respiratory: CTA B Gastrointestinal: abdomen soft, NT, ND, BS+ Musculoskeletal: no deformities, strength intact in all 4 Skin: moist, warm, no rashes Neurological: mild tremor with outstretched hands, DTR normal in all 4  Assessment:     1. DM2, insulin-dependent, uncontrolled, without complications    Plan:     Pt with uncontrolled type 2 diabetes, with recent addition to insulin. She has a poor diet, and we discussed about ways to improve it. I offered to send her to nutrition but she mentions that she had 5 nutrition classes, and she is aware about healthy eating rules. - I reviewed the sugars in her meter for the last week and she has high sugars in am, they decrease towards lunch and then become erratic in hs. One problem is that the patient is grazing between meals - in this case, regular rather than analog insulin is preferred, however we will continue with NovoLog for now, and I would propose this to her before we need to refill her NovoLog, if her sugars do not improve - her high sugars in the morning might be caused by previous lows during the night with a compensatory early morning hypoglycemia - I  would therefore decrease the dose of her Lantus to 25 units - for the same reason, I will also decrease her dinner NovoLog dose to 7 units - since she does not have kidney dysfunction, we can start metformin to build up to 2000 units daily - given sugar log and advised how to fill it and to bring it at next appt - given foot care handout and explained the principles - given instructions for hypoglycemia management "15-15 rule" - given healthy eating brochure - I will see the patient back in a month with her sugar log

## 2012-11-04 NOTE — Patient Instructions (Addendum)
Please return in 1 month with your sugar log. Please decrease Lantus to 25 units. Please decrease the Novolog with dinner to 7 units. Start Metformin slowly and build up to 1000 mg twice a day (with breakfast and dinner). Please join MyChart.  Patient instructions for Diabetes mellitus type 2:  DIET AND EXERCISE Diet and exercise is an important part of diabetic treatment.  We recommended aerobic exercise in the form of brisk walking (working between 40-60% of maximal aerobic capacity, similar to brisk walking) for 150 minutes per week (such as 30 minutes five days per week) along with 3 times per week performing 'resistance' training (using various gauge rubber tubes with handles) 5-10 exercises involving the major muscle groups (upper body, lower body and core) performing 10-15 repetitions (or near fatigue) each exercise. Start at half the above goal but build slowly to reach the above goals. If limited by weight, joint pain, or disability, we recommend daily walking in a swimming pool with water up to waist to reduce pressure from joints while allow for adequate exercise.    BLOOD GLUCOSES Monitoring your blood glucoses is important for continued management of your diabetes. Please check your blood glucoses >2 times a day: fasting, before meals and at bedtime (you can rotate these measurements - e.g. one day check before the 3 meals, the next day check before 2 of the meals and before bedtime, etc.   HYPOGLYCEMIA (low blood sugar) Hypoglycemia is usually a reaction to not eating, exercising, or taking too much insulin/ other diabetes drugs.  Symptoms include tremors, sweating, hunger, confusion, headache, etc. Treat IMMEDIATELY with 15 grams of Carbs:   4 glucose tablets    cup regular juice/soda   2 tablespoons raisins   4 teaspoons sugar   1 tablespoon honey Recheck blood glucose in 15 mins and repeat above if still symptomatic/blood glucose <100. Please contact our office at  [phone] if you have questions about how to next handle your insulin.  RECOMMENDATIONS TO REDUCE YOUR RISK OF DIABETIC COMPLICATIONS: * Take your prescribed MEDICATION(S). * Follow a DIABETIC diet: Complex carbs, fiber rich foods, heart healthy fish twice weekly, (monounsaturated and polyunsaturated) fats * AVOID saturated/trans fats, high fat foods, >2,300 mg salt per day. * EXERCISE at least 5 times a week for 30 minutes or preferably daily.  * DO NOT SMOKE OR DRINK more than 1 drink a day. * Check your FEET every day. Do not wear tightfitting shoes. Contact us if you develop an ulcer * See your EYE doctor once a year or more if needed * Get a FLU shot once a year * Get a PNEUMONIA vaccine every 5 years and once after age 95 years  GOALS:  * Your Hemoglobin A1c of 7%  * Your Systolic BP should be 140 or lower  * Your Diastolic BP should be 80 or lower  * Your HDL (Good Cholesterol) should be 40 or higher  * Your LDL (Bad Cholesterol) should be 100 or lower  * Your Triglycerides should be 150 or lower  * Your Urine microalbumin (kidney function) of <30 * Your Body Mass Index should be 25 or lower  We will be glad to help you achieve these goals. Our telephone number is: [phone].

## 2012-11-07 ENCOUNTER — Ambulatory Visit: Payer: 59 | Admitting: Internal Medicine

## 2012-12-04 ENCOUNTER — Encounter: Payer: Self-pay | Admitting: Internal Medicine

## 2012-12-04 ENCOUNTER — Ambulatory Visit (INDEPENDENT_AMBULATORY_CARE_PROVIDER_SITE_OTHER): Payer: 59 | Admitting: Internal Medicine

## 2012-12-04 VITALS — BP 118/62 | HR 89 | Temp 98.0°F | Resp 10 | Wt 200.0 lb

## 2012-12-04 DIAGNOSIS — IMO0001 Reserved for inherently not codable concepts without codable children: Secondary | ICD-10-CM

## 2012-12-04 NOTE — Progress Notes (Signed)
Subjective:     Patient ID: Cassandra Morris, female   DOB: April 29, 1951, 62 y.o.   MRN: [id number]  HPI Cassandra Morris is a pleasant 62 year old woman, returning for f/u for DM2, dx 2011, insulin-dependent, uncontrolled, without complications.  She is now on a regimen of: - Lantus 25 units at bedtime (reduced from 30 units at last visit due to alternating low and high CBGs in a.m.) - Humalog 5-5-7 before meals - metformin 500 mg twice a day (we tried to increase to 1000 mg twice a day, but this gave her diarrhea)  She checks her sugars several times a day (5-8) and she brings a detailed sugar log which we reviewed together. Overall, her sugars are much improved compared to before. CBGs: - am: 130-185 - 2h after b'fast: 110-190s - prelunch: 90-190 - 2h postlunch: 99-191 - predinner: 90-149 - 2h post dinner: 118-168 - bedtime: 135-187 Lowest: 70 x 1.  She also tells me that she feels better physically and less fatigued.   Last GFR normal, Cr 0.8 2 mo ago. No neuropathy. She had an eye exam in 06/2012. She has a h/o iritis. She also has a medical history of hypertension, hyperlipidemia, depression, obesity.  Review of Systems Constitutional: + weight gain, + fatigue Eyes: no blurry vision, no xerophthalmia ENT: no sore throat, no nodules palpated in throat, no dysphagia/odynophagia, no hoarseness Cardiovascular: no CP/SOB/palpitations/leg swelling Respiratory: + cough/SOB Gastrointestinal: no N/V+ D/C Musculoskeletal: no muscle/joint aches Skin: no rashes Neurological: no tremors/numbness/tingling/dizziness Psychiatric: no depression/no anxiety  I reviewed pt's medications, allergies, PMH, social hx, family hx and no changes required.  Objective:   Physical Exam BP 118/62  Pulse 89  Temp(Src) 98 F (36.7 C) (Oral)  Resp 10  Wt 200 lb (90.719 kg)  BMI 30.84 kg/m2  SpO2 96% Wt Readings from Last 3 Encounters:  12/04/12 200 lb (90.719 kg)  11/04/12 198 lb (89.812 kg)  10/28/12  198 lb (89.812 kg)  Constitutional: overweight, in NAD Eyes: unequal pupils L>R, EOMI, no exophthalmos ENT: moist mucous membranes, no thyromegaly, no cervical lymphadenopathy Cardiovascular: RRR, No MRG Respiratory: CTA B Gastrointestinal: abdomen soft, NT, ND, BS+ Musculoskeletal: no deformities, strength intact in all 4 Skin: moist, warm, no rashes Neurological: mild tremor with outstretched hands, DTR normal in all 4  Assessment:     1. DM2, insulin-dependent, uncontrolled, without complications    Plan:     Patient with much improved DM2 control in the last month. She does a great job documenting her sugars and trying to eat healthier. She also plans to start going to the gym and exercise and I advised her that, at that time, she might decrease her sugars, so her insulin doses would need to be adjusted.  - the pattern of her CBGs are still with higher sugars in a.m. which decreased throughout the day, therefore, we will increase the dose of her Lantus to 28 units - Will keep her NovoLog insulin at 5-5-7 units which is 3 meals - for the same reason, I will also decrease her dinner NovoLog dose to 7 units - I advised her to keep the metformin at 500 mg twice a day - given more sugar logs and advised how to bring it at next appt - I will see the patient back in 2 months with her sugar log

## 2012-12-04 NOTE — Patient Instructions (Signed)
Keep up the good work! Please increase the Lantus dose to 20 units. Keep the NovoLog doses the same for now. If you start exercising, expected her sugars to decrease, we might need to decrease her insulin, too. Please return in 2 months with her sugar log.

## 2012-12-09 ENCOUNTER — Ambulatory Visit: Payer: 59 | Admitting: Internal Medicine

## 2012-12-15 ENCOUNTER — Ambulatory Visit: Payer: 59 | Admitting: Internal Medicine

## 2012-12-22 ENCOUNTER — Ambulatory Visit (INDEPENDENT_AMBULATORY_CARE_PROVIDER_SITE_OTHER): Payer: 59 | Admitting: Internal Medicine

## 2012-12-22 VITALS — BP 144/64 | HR 88 | Temp 98.6°F | Wt 202.0 lb

## 2012-12-22 DIAGNOSIS — E785 Hyperlipidemia, unspecified: Secondary | ICD-10-CM

## 2012-12-22 DIAGNOSIS — F329 Major depressive disorder, single episode, unspecified: Secondary | ICD-10-CM

## 2012-12-22 DIAGNOSIS — I10 Essential (primary) hypertension: Secondary | ICD-10-CM

## 2012-12-22 DIAGNOSIS — J329 Chronic sinusitis, unspecified: Secondary | ICD-10-CM | POA: Insufficient documentation

## 2012-12-22 DIAGNOSIS — IMO0001 Reserved for inherently not codable concepts without codable children: Secondary | ICD-10-CM

## 2012-12-22 MED ORDER — BENAZEPRIL HCL 10 MG PO TABS: 10.0000 mg | ORAL_TABLET | Freq: Every day | ORAL | Status: DC

## 2012-12-22 MED ORDER — AMOXICILLIN 875 MG PO TABS: 875.0000 mg | ORAL_TABLET | Freq: Two times a day (BID) | ORAL | Status: DC

## 2012-12-22 MED ORDER — DULOXETINE HCL 30 MG PO CPEP: ORAL_CAPSULE | ORAL | Status: DC

## 2012-12-22 MED ORDER — GLUCOSE BLOOD VI STRP: ORAL_STRIP | Status: DC

## 2012-12-22 MED ORDER — METFORMIN HCL ER 500 MG PO TB24: 500.0000 mg | ORAL_TABLET | Freq: Two times a day (BID) | ORAL | Status: DC

## 2012-12-22 MED ORDER — SERTRALINE HCL 100 MG PO TABS: 100.0000 mg | ORAL_TABLET | Freq: Every day | ORAL | Status: DC

## 2012-12-22 NOTE — Assessment & Plan Note (Addendum)
Overall her diabetes control has improved since referral to endocrinologist. Patient experienced 2 episodes of significant hypoglycemia. She understands to decrease Humalog dose depending on carbohydrate intake. She also was advised to eat a snack before physical activity.  Refilled test strips.  Change metformin to XR due to GI side effects (500 mg bid).

## 2012-12-22 NOTE — Assessment & Plan Note (Signed)
Blood pressure is suboptimally controlled. Increase benazepril to 10 mg once daily.  Obtain BMET.

## 2012-12-22 NOTE — Assessment & Plan Note (Signed)
Patient having persistent depressive symptoms despite taking 150 mg of sertraline. Patient advised to taper off over next 2 weeks and switch to Cymbalta.  Reassess in 8 weeks.

## 2012-12-22 NOTE — Progress Notes (Signed)
  Subjective:    Patient ID: Cassandra Morris, female    DOB: [birth year], 62 y.o.   MRN: [id number]  HPI  62 year old white female with history of uncontrolled type 2 diabetes, hypertension and depression for followup. Since previous visit, patient was seen by endocrinologist. Her insulin regimen has been adjusted. Her blood sugars are much improved. However, she reports 2 episodes of significant hypoglycemia. Most recent episode occurred this past Saturday. She reports increased activity and decrease carbohydrate intake.  Patient tolerating benazepril. She does not report any side effects.  She's been on Zoloft for depression. Patient feels medication not effective. She is currently taking 150 mg once daily. She complains of apathy/lack of motivation.  Patient also thinks she may have a sinus infection. Over last 1 week she reports sinus congestion and green nasal discharge. She denies cough or shortness of breath but has experienced low-grade fevers.  Review of Systems Negative for chest pain, higher dose of metformin reduce due to GI side effects (diarrhea)  Past Medical History  Diagnosis Date  . Depression   . Diabetes mellitus     T2DM  . Hyperlipidemia   . Obesity (BMI 30-39.9)     History   Social History  . Marital Status: Divorced    Spouse Name: N/A    Number of Children: N/A  . Years of Education: N/A   Occupational History  . Not on file.   Social History Main Topics  . Smoking status: Former Smoker    Quit date: 03/16/2010  . Smokeless tobacco: Never Used  . Alcohol Use: 0.0 oz/week  . Drug Use: No  . Sexually Active: Not on file   Other Topics Concern  . Not on file   Social History Narrative   Caffeine use: daily, soda   Regular exercise: joined gym, cut back due to current health issues             No past surgical history on file.  Family History  Problem Relation Age of Onset  . Diabetes Mother     No Known Allergies  Current Outpatient  Prescriptions on File Prior to Visit  Medication Sig Dispense Refill  . Cyanocobalamin (VITAMIN B-12) 2500 MCG SUBL Place 2 each under the tongue daily.        . Insulin Pen Needle (COMFORT EZ PEN NEEDLES) 31G X 8 MM MISC Use 4 times daily  100 each  5  . pravastatin (PRAVACHOL) 40 MG tablet Take 1 tablet (40 mg total) by mouth daily.  90 tablet  1   No current facility-administered medications on file prior to visit.    BP 144/64  Pulse 88  Temp(Src) 98.6 F (37 C) (Oral)  Wt 202 lb (91.627 kg)  BMI 31.15 kg/m2       Objective:   Physical Exam  Constitutional: She is oriented to person, place, and time. She appears well-developed and well-nourished.  HENT:  Head: Normocephalic and atraumatic.  Right Ear: External ear normal.  Left Ear: External ear normal.  Mild oropharyngeal erythema  Cardiovascular: Normal rate, regular rhythm and normal heart sounds.   Pulmonary/Chest: Effort normal and breath sounds normal. She has no wheezes.  Musculoskeletal: She exhibits no edema.  Neurological: She is alert and oriented to person, place, and time. No cranial nerve deficit.  Psychiatric: She has a normal mood and affect. Her behavior is normal.          Assessment & Plan:

## 2012-12-22 NOTE — Patient Instructions (Signed)
Decrease sertraline to 50 mg once daily 2 weeks then stop. Start cymbalta as directed.

## 2012-12-22 NOTE — Assessment & Plan Note (Signed)
Patient having symptoms of possible sinusitis.  Treat with amoxicillin 875 mg twice daily.  Patient advised to call office if symptoms persist or worsen.

## 2012-12-23 LAB — BASIC METABOLIC PANEL
Calcium: 8.8 mg/dL (ref 8.4–10.5)
Chloride: 100 mEq/L (ref 96–112)
Creatinine, Ser: 1 mg/dL (ref 0.4–1.2)
GFR: 61.9 mL/min (ref >60.00)

## 2012-12-23 LAB — LDL CHOLESTEROL, DIRECT: Direct LDL: 119 mg/dL

## 2012-12-23 LAB — LIPID PANEL
HDL: 41.4 mg/dL (ref >39.00)
Total CHOL/HDL Ratio: 4
Triglycerides: 228 mg/dL — ABNORMAL HIGH (ref 0.0–149.0)
VLDL: 45.6 mg/dL — ABNORMAL HIGH (ref 0.0–40.0)

## 2012-12-23 LAB — HEPATIC FUNCTION PANEL
Bilirubin, Direct: 0 mg/dL (ref 0.0–0.3)
Total Bilirubin: 0.3 mg/dL (ref 0.3–1.2)

## 2013-01-26 ENCOUNTER — Other Ambulatory Visit: Payer: Self-pay | Admitting: Internal Medicine

## 2013-02-05 ENCOUNTER — Encounter: Payer: Self-pay | Admitting: Internal Medicine

## 2013-02-05 ENCOUNTER — Ambulatory Visit (INDEPENDENT_AMBULATORY_CARE_PROVIDER_SITE_OTHER): Payer: 59 | Admitting: Internal Medicine

## 2013-02-05 ENCOUNTER — Ambulatory Visit: Payer: 59 | Admitting: Internal Medicine

## 2013-02-05 VITALS — BP 124/72 | HR 92 | Temp 99.9°F | Resp 10 | Ht 67.5 in | Wt 207.0 lb

## 2013-02-05 DIAGNOSIS — IMO0001 Reserved for inherently not codable concepts without codable children: Secondary | ICD-10-CM

## 2013-02-05 NOTE — Patient Instructions (Addendum)
Please increase the insulin doses as follows:  - Lantus 28 units every night - Humalog 8-8-8 units - add the following Humalog sliding scale if sugars are >150 before a meal:  - 151-175: + 1 units  - 176-200: + 2 units  - 201-225: + 3 units  - 226-250: + 4 units  - 251-275: + 5units  - >275: + 6 units - Please return in 2 months with your sugar log.

## 2013-02-05 NOTE — Progress Notes (Signed)
Subjective:     Patient ID: Cassandra Morris, female   DOB: Oct 03, 1950, 62 y.o.   MRN: [id number]  HPI Cassandra Morris is a pleasant 62 year old woman, returning for f/u for DM2, dx 2011, insulin-dependent, uncontrolled, without complications. Last visit 2 mo ago.  [facility], previously:  Lab Results  Component Value Date   HGBA1C 11.9* 10/24/2012   She is now on a regimen of: - Lantus 25 units at bedtime (did not increase to 28 units as advised at last visit - forgot) - Humalog 7-7-7 before meals (she increased this from 5-5-7) - metformin XR 500 mg twice a day (we tried to increase to 1000 mg twice a day, but this gave her diarrhea) - Turmeric 1 capsule daily  She checks her sugars several times a day (3-8) and she brings a sugar log which we reviewed together. Overall, her sugars are similar to last visit, however, her sugar log is a little bit less organized, so he was difficult to extract the data from it. She still writes down but she eats, the doses of insulin that she takes, and also her sugars. CBGs: - am: 130-185 >> 150-180, 200s when forgets Lantus - 2h after b'fast: 110-190s >> 170-200 - prelunch: 90-190 >> 140s - 2h postlunch: 99-191 >> 170-200 - predinner: 90-149 >> 90-185 - 2h post dinner: 118-168 >> still high, but snacks after dinner - bedtime: 135-187 Lowest 2 weeks ago: 63 x 1, after lunch- these happen during the weekend as she eats better.    She also tells me that she feels better since starting Cymbalta. She was previously on Zoloft.  Last GFR normal, Cr 0.8 2 mo ago. No neuropathy. She had an eye exam in 06/2012. She has a h/o iritis. She also has a medical history of hypertension, hyperlipidemia, depression, obesity.  I reviewed pt's medications, allergies, PMH, social hx, family hx and no changes required, except as mentioned above. She started Cymbalta instead of Zoloft.  Review of Systems Constitutional: + weight  gain, no fatigue, + subjective hyperthermia Eyes: no blurry vision, no xerophthalmia ENT: no sore throat, no nodules palpated in throat, no dysphagia/odynophagia, no hoarseness Cardiovascular: no CP/SOB/palpitations/leg swelling Respiratory: no cough/SOB Gastrointestinal: no N/V+ D/C Musculoskeletal: no muscle/joint aches Skin: no rashes Neurological: no tremors/numbness/tingling/dizziness Psychiatric: no depression/no anxiety  I reviewed pt's medications, allergies, PMH, social hx, family hx and no changes required.  Objective:   Physical Exam BP 124/72  Pulse 92  Temp(Src) 99.9 F (37.7 C) (Oral)  Resp 10  Ht 5' 7.5" (1.715 m)  Wt 207 lb (93.895 kg)  BMI 31.92 kg/m2  SpO2 95% Wt Readings from Last 3 Encounters:  02/05/13 207 lb (93.895 kg)  12/22/12 202 lb (91.627 kg)  12/04/12 200 lb (90.719 kg)  Constitutional: overweight, in NAD Eyes: unequal pupils L>R - surgical R pupil, EOMI, no exophthalmos ENT: moist mucous membranes, no thyromegaly, no cervical lymphadenopathy Cardiovascular: RRR, No MRG Respiratory: CTA B Gastrointestinal: abdomen soft, NT, ND, BS+ Musculoskeletal: no deformities, strength intact in all 4 Skin: moist, warm, no rashes Neurological: no tremor with outstretched hands, DTR normal in all 4  Assessment:     1. DM2, insulin-dependent, uncontrolled, without complications    Plan:     Patient with much improved DM2 control recently, with last HbA1c decreased from ~12% to 8%. She does a great job documenting her sugars and trying to eat healthier.  - since she  did not increase the dose of Lantus as advised at last visit, and her sugars in a.m., I advised her to increase the dose of her Lantus to 28 units - Will increase her NovoLog insulin slightly to 8-8-8 units with her 3 meals - at this visit, I also added a correction scale with a target of 150 and a sensitivity factor of 25:  - 151-175: + 1 units  - 176-200: + 2 units  - 201-225: + 3 units  -  226-250: + 4 units  - 251-275: + 5units  - >275: + 6 units - she understands how to use as the sliding scale - I advised her to keep the metformin at 500 mg twice a day - given more sugar logs and advised how to bring it at next appt - I will see the patient back in 2 months with her sugar log

## 2013-02-16 ENCOUNTER — Ambulatory Visit: Payer: 59 | Admitting: Internal Medicine

## 2013-02-24 ENCOUNTER — Ambulatory Visit: Payer: 59 | Admitting: Internal Medicine

## 2013-03-10 ENCOUNTER — Encounter: Payer: Self-pay | Admitting: Internal Medicine

## 2013-03-10 ENCOUNTER — Ambulatory Visit (INDEPENDENT_AMBULATORY_CARE_PROVIDER_SITE_OTHER): Payer: 59 | Admitting: Internal Medicine

## 2013-03-10 VITALS — BP 140/70 | HR 87 | Temp 98.6°F | Resp 20 | Wt 211.0 lb

## 2013-03-10 DIAGNOSIS — I10 Essential (primary) hypertension: Secondary | ICD-10-CM

## 2013-03-10 DIAGNOSIS — IMO0001 Reserved for inherently not codable concepts without codable children: Secondary | ICD-10-CM

## 2013-03-10 NOTE — Progress Notes (Signed)
Subjective:    Patient ID: Cassandra Morris, female    DOB: 1951/04/04, 62 y.o.   MRN: [id number]  HPI   62 year old patient who is seen today for followup. She has treated hypertension as well as insulin requiring diabetes. She is now being followed by endocrinology and has done much better on her present regimen of basal and mealtime insulin.  Hemoglobin A1c has improved from approximately 12 to 8 she has noted the much improved glycemic control. She has hypertension and dyslipidemia. In general doing quite well. Denies any cardiac complaints.  Wt Readings from Last 3 Encounters:  03/10/13 211 lb (95.709 kg)  02/05/13 207 lb (93.895 kg)  12/22/12 202 lb (91.627 kg)   Past Medical History  Diagnosis Date  . Depression   . Diabetes mellitus     T2DM  . Hyperlipidemia   . Obesity (BMI 30-39.9)     History   Social History  . Marital Status: Divorced    Spouse Name: N/A    Number of Children: N/A  . Years of Education: N/A   Occupational History  . Not on file.   Social History Main Topics  . Smoking status: Former Smoker    Quit date: 03/16/2010  . Smokeless tobacco: Never Used  . Alcohol Use: 0.0 oz/week  . Drug Use: No  . Sexually Active: Not on file   Other Topics Concern  . Not on file   Social History Narrative   Caffeine use: daily, soda   Regular exercise: joined gym, cut back due to current health issues             History reviewed. No pertinent past surgical history.  Family History  Problem Relation Age of Onset  . Diabetes Mother     No Known Allergies  Current Outpatient Prescriptions on File Prior to Visit  Medication Sig Dispense Refill  . benazepril (LOTENSIN) 10 MG tablet Take 1 tablet (10 mg total) by mouth daily.  90 tablet  1  . Cyanocobalamin (VITAMIN B-12) 2500 MCG SUBL Place 2 each under the tongue daily.        . DULoxetine (CYMBALTA) 30 MG capsule TAKE 1 CAPSULE BY MOUTH ONCE DAILY FOR 1 WEEK,THEN TAKE 2 CAPSULES BY MOUTH ONCE  DAILY  60 capsule  0  . glucose blood (ONETOUCH VERIO) test strip Use as instructed  100 each  12  . insulin glargine (LANTUS) 100 UNIT/ML injection Inject 25 Units into the skin at bedtime.      . insulin lispro (HUMALOG) 100 UNIT/ML injection Use 5 units before breakfast and lunch and 7-8 units before dinner      . Insulin Pen Needle (COMFORT EZ PEN NEEDLES) 31G X 8 MM MISC Use 4 times daily  100 each  5  . metFORMIN (GLUCOPHAGE-XR) 500 MG 24 hr tablet Take 1 tablet (500 mg total) by mouth 2 (two) times daily.  60 tablet  3  . pravastatin (PRAVACHOL) 40 MG tablet Take 1 tablet (40 mg total) by mouth daily.  90 tablet  1   No current facility-administered medications on file prior to visit.    BP 140/70  Pulse 87  Temp(Src) 98.6 F (37 C) (Oral)  Resp 20  Wt 211 lb (95.709 kg)  BMI 32.54 kg/m2  SpO2 98%      Review of Systems  Constitutional: Negative.   HENT: Negative for hearing loss, congestion, sore throat, rhinorrhea, dental problem, sinus pressure and tinnitus.   Eyes: Negative for pain,  discharge and visual disturbance.  Respiratory: Negative for cough and shortness of breath.   Cardiovascular: Negative for chest pain, palpitations and leg swelling.  Gastrointestinal: Negative for nausea, vomiting, abdominal pain, diarrhea, constipation, blood in stool and abdominal distention.  Genitourinary: Negative for dysuria, urgency, frequency, hematuria, flank pain, vaginal bleeding, vaginal discharge, difficulty urinating, vaginal pain and pelvic pain.  Musculoskeletal: Negative for joint swelling, arthralgias and gait problem.  Skin: Negative for rash.  Neurological: Negative for dizziness, syncope, speech difficulty, weakness, numbness and headaches.  Hematological: Negative for adenopathy.  Psychiatric/Behavioral: Negative for behavioral problems, dysphoric mood and agitation. The patient is not nervous/anxious.        Objective:   Physical Exam  Constitutional: She is  oriented to person, place, and time. She appears well-developed and well-nourished.  HENT:  Head: Normocephalic.  Right Ear: External ear normal.  Left Ear: External ear normal.  Mouth/Throat: Oropharynx is clear and moist.  Eyes: Conjunctivae and EOM are normal. Pupils are equal, round, and reactive to light.  Neck: Normal range of motion. Neck supple. No thyromegaly present.  Cardiovascular: Normal rate, regular rhythm, normal heart sounds and intact distal pulses.   Pulmonary/Chest: Effort normal and breath sounds normal.  Abdominal: Soft. Bowel sounds are normal. She exhibits no mass. There is no tenderness.  Musculoskeletal: Normal range of motion.  Lymphadenopathy:    She has no cervical adenopathy.  Neurological: She is alert and oriented to person, place, and time.  Skin: Skin is warm and dry. No rash noted.  Psychiatric: She has a normal mood and affect. Her behavior is normal.          Assessment & Plan:  Diabetes mellitus. Much improved we'll continue basal and bolus insulin. Followup endocrinology in one month as scheduled Hypertension well controlled Dyslipidemia. Continue pravastatin  Followup endocrinology one month Followup PCP 6 months or as needed

## 2013-03-10 NOTE — Patient Instructions (Addendum)
Please check your hemoglobin A1c every 3 months    It is important that you exercise regularly, at least 20 minutes 3 to 4 times per week.  If you develop chest pain or shortness of breath seek  medical attention.  You need to lose weight.  Consider a lower calorie diet and regular exercise. 

## 2013-04-07 ENCOUNTER — Ambulatory Visit (INDEPENDENT_AMBULATORY_CARE_PROVIDER_SITE_OTHER): Payer: 59 | Admitting: Internal Medicine

## 2013-04-07 ENCOUNTER — Other Ambulatory Visit: Payer: Self-pay | Admitting: Internal Medicine

## 2013-04-07 ENCOUNTER — Encounter: Payer: Self-pay | Admitting: Internal Medicine

## 2013-04-07 VITALS — BP 108/60 | HR 93 | Temp 99.1°F | Resp 12 | Wt 211.0 lb

## 2013-04-07 DIAGNOSIS — IMO0001 Reserved for inherently not codable concepts without codable children: Secondary | ICD-10-CM

## 2013-04-07 NOTE — Progress Notes (Signed)
Subjective:     Patient ID: Cassandra Morris, female   DOB: 1951/07/15, 62 y.o.   MRN: [id number]  HPI Cassandra Morris is a pleasant 62 year old woman, returning for f/u for DM2, dx 2011, insulin-dependent, uncontrolled, without complications. Last visit 2 mo ago.  She tells me that she has severe depression during the summer >> could not control her sugars well since the last visit. She usually feels better with the first days of fall. We spent most of the visit discussing about her depression, which is not controlled well on Celexa anymore. She is seeing a psychologist Darl Pikes?), but not recently. She tells me that for the last 3 weeks, she could not do anything else but eat sweets, and stay in the house. She did not give him watch TV or do anything else. She feels too hot to go outside and at today's visit she appears sweating and in distress because of this. She denies suicidal ideation, but tells me that her father committed suicide and she probably has PTSD from this. She also talks about a celebrity that recently died of suicide due to severe depression. She is enlivened by the fact that she will go to the beach next week. She hopes that this will help her depression, and limit her sweet intake, too.  We had great progress in her diabetes control before last visit. [facility], previously:  Lab Results  Component Value Date   HGBA1C 11.9* 10/24/2012   She is now on a regimen of: - Lantus 28 units at bedtime  - Humalog 8-8-8 before meals  - metformin XR 500 mg twice a day (we tried to increase to 1000 mg twice a day, but this gave her diarrhea) - Turmeric 1 capsule daily She mentions that she does take her insulin doses consistently along with her metformin.  She takes an adrenal fatigue supplement (decreases cortisol by up to 26%, per note on the label) and a kidney cleanser (contains kelp).   She checks her sugars several times a day - 4x a day >> no  log. Overall, her sugars are much worse - per her recall: - am: 130-185 >> 150-180, 200s when forgets Lantus >>180-190 - 2h after b'fast: 110-190s >> 170-200 >> not checking - prelunch: 90-190 >> 140s >> 170s - 2h postlunch: 99-191 >> 170-200 >> 200s - predinner: 90-149 >> 90-185 >> 200s - 2h post dinner: 118-168 >> not checking - bedtime: 135-187 >> not checking Lowest 2 weeks ago: 63 x 1, after lunch- these happen during the weekend as she eats better.   - No CKD: Lab Results  Component Value Date   TSH 1.39 10/24/2012   CREATININE 1.0 12/22/2012  - No neuropathy.  - She had an eye exam in 06/2012. She has a h/o iritis.   She also has a medical history of hypertension, hyperlipidemia, obesity.  I reviewed pt's medications, allergies, PMH, social hx, family hx and no changes required, except as mentioned above.   Review of Systems Constitutional: + weight gain, no fatigue, + subjective hyperthermia Eyes: no blurry vision, no xerophthalmia ENT: no sore throat, no nodules palpated in throat, no dysphagia/odynophagia, no hoarseness Cardiovascular: no CP/SOB/palpitations/leg swelling Respiratory: no cough/SOB Gastrointestinal: no N/V+ D/C Musculoskeletal: no muscle/joint aches Psychiatric: severe depression  Objective:   Physical Exam BP 108/60  Pulse 93  Temp(Src) 99.1 F (37.3 C) (Oral)  Resp 12  Wt 211 lb (95.709 kg)  BMI 32.54 kg/m2  SpO2 97% Wt Readings from Last 3 Encounters:  04/07/13 211 lb (95.709 kg)  03/10/13 211 lb (95.709 kg)  02/05/13 207 lb (93.895 kg)  Constitutional: overweight, in NAD Eyes: unequal pupils L>R - surgical R pupil, EOMI, no exophthalmos ENT: moist mucous membranes, no thyromegaly, no cervical lymphadenopathy Cardiovascular: RRR, No MRG Respiratory: CTA B Gastrointestinal: abdomen soft, NT, ND, BS+ Musculoskeletal: no deformities, strength intact in all 4 Skin: moist, warm, no rashes  Assessment:     1. DM2, insulin-dependent,  uncontrolled, without complications  2. Severe depression    Plan:     Patient with much worse DM2 control recently, after a large improvement in her HbA1c due to changes in diet (decreased from ~12% to 8%). She has severe depression, which is greatly impairing her ability to stay away from sweets and keep a good diabetes control. He had a long discussion about this, and I really hope that her trip to the beach would help her. I also asked her to call her psychologist as soon as possible and schedule a visit. She agrees to do this. She denies suicidal ideation, but keeps bringing this into discussion so I am worried  - for now, we decided to stay on the same dose of Lantus 28 units - Will keep her NovoLog insulin at 8-8-8 units with her 3 meals - until she can limit the intake of her sweets for snacks, I advised her to inject 2 units of insulin with a snack, up to 3 snacks a day - continue the NovoLog correction scale with a target of 150 and a sensitivity factor of 25:  - 151-175: + 1 units  - 176-200: + 2 units  - 201-225: + 3 units  - 226-250: + 4 units  - 251-275: + 5units  - >275: + 6 units - I advised her to keep the metformin at 500 mg twice a day - I will see the patient back in 1 month with her sugar log

## 2013-04-07 NOTE — Patient Instructions (Addendum)
Please stay on the current insulin regimen. You can inject 2 units of insulin with a snack, but no more than with 3 snacks a day. Please return in 1 month with your sugar log.

## 2013-04-09 NOTE — Progress Notes (Signed)
TALKED WITH pt and told her that dr Artist Pais wanted her to go to dr reddy and gave his phone number [phone] and she will make appointment

## 2013-05-05 ENCOUNTER — Other Ambulatory Visit: Payer: Self-pay | Admitting: Internal Medicine

## 2013-05-05 ENCOUNTER — Encounter: Payer: Self-pay | Admitting: Internal Medicine

## 2013-05-05 ENCOUNTER — Ambulatory Visit (INDEPENDENT_AMBULATORY_CARE_PROVIDER_SITE_OTHER): Payer: 59 | Admitting: Internal Medicine

## 2013-05-05 VITALS — BP 112/62 | HR 93 | Temp 98.5°F | Resp 12 | Wt 209.0 lb

## 2013-05-05 DIAGNOSIS — IMO0001 Reserved for inherently not codable concepts without codable children: Secondary | ICD-10-CM

## 2013-05-05 NOTE — Patient Instructions (Addendum)
Please return in 2 months - please bring your sugar log. - Continue the same dose of Lantus 28 units - continue NovoLog insulin: 6 units with a small meal 8 units with a regular meal 10 units with a large meal or dinner -  inject 2 units of insulin with a snack, up to 3 snacks a day - continue the NovoLog correction scale::  - 150- 165: + 1 unit  - 166- 180: + 2 units  - 181- 195: + 3 units  - 196- 210: + 4 units  - 211- 225: + 5 units  - 226- 240: + 6 units  - 241- 255: + 7 units  - 256- 270: + 8 units  - 271- 285: + 9 units  - > 286: + 10 units  - I advised her to keep the metformin at 500 mg twice a day

## 2013-05-05 NOTE — Progress Notes (Signed)
Subjective:     Patient ID: Cassandra Morris, female   DOB: 1951-07-13, 62 y.o.   MRN: [id number]  HPI Cassandra Morris is a pleasant 62 year old woman, returning for f/u for DM2, dx 2011, insulin-dependent, uncontrolled, without complications. Last visit 2 mo ago.  At last visit, she had severe depression, which she has every summer >> could not control her sugars well. She now feels better and started to eat better. Sugars have improved. Highest 250. Lowest 75 x 1 since last visit.   We had great progress in her diabetes control before last visit. [facility]; previously:  Lab Results  Component Value Date   HGBA1C 11.9* 10/24/2012   She is now on a regimen of: - Lantus 28 units at bedtime  - Humalog 8-8-8 before meals  - Humalog target 150, and a sensitivity factor of 25:  - 151-175: + 1 units  - 176-200: + 2 units  - 201-225: + 3 units  - 226-250: + 4 units  - 251-275: + 5units  - >275: + 6 units - metformin XR 500 mg twice a day (we tried to increase to 1000 mg twice a day, but this gave her diarrhea) - Turmeric 1 capsule daily She mentions that she does take her insulin doses consistently along with her metformin.  She checks her sugars several times a day - 4x a day >> no log. Overall, her sugars are improved a little - per her recall: - am: 130-185 >> 150-180, 200s when forgets Lantus >>180-190 >> 180 - 2h after b'fast: 110-190s >> 170-200 >> not checking >> 150-170 - prelunch: 90-190 >> 140s >> 170s >> 150s - 2h postlunch: 99-191 >> 170-200 >> 200s >> 180-200 - predinner: 90-149 >> 90-185 >> 200s >> 150-180 - 2h post dinner: 118-168 >> not checking >> 200 - bedtime: 135-187 >> not checking >> 200 (grazes)  Cut out sodas. Started to eat salads again.   - No CKD: Lab Results  Component Value Date   TSH 1.39 10/24/2012   CREATININE 1.0 12/22/2012  - No neuropathy.  - She had an eye exam in 06/2012. She has a h/o iritis.   She also  has a medical history of hypertension, hyperlipidemia, obesity.  I reviewed pt's medications, allergies, PMH, social hx, family hx and no changes required, except as mentioned above.   Review of Systems Constitutional: + weight gain, + fatigue, + subjective hyperthermia, + nocturia Eyes: no blurry vision, no xerophthalmia ENT: no sore throat, no nodules palpated in throat, no dysphagia/odynophagia, no hoarseness Cardiovascular: no CP/SOB/palpitations/leg swelling Respiratory: no cough/SOB Gastrointestinal: no N/VD/C Musculoskeletal: no muscle/joint aches Psychiatric: depression better  Objective:   Physical Exam BP 112/62  Pulse 93  Temp(Src) 98.5 F (36.9 C) (Oral)  Resp 12  Wt 209 lb (94.802 kg)  BMI 32.23 kg/m2  SpO2 97% Wt Readings from Last 3 Encounters:  05/05/13 209 lb (94.802 kg)  04/07/13 211 lb (95.709 kg)  03/10/13 211 lb (95.709 kg)  Constitutional: overweight, in NAD Eyes: unequal pupils L>R - surgical R pupil, EOMI, no exophthalmos ENT: moist mucous membranes, no thyromegaly, no cervical lymphadenopathy Cardiovascular: RRR, No MRG Respiratory: CTA B Gastrointestinal: abdomen soft, NT, ND, BS+ Musculoskeletal: no deformities, strength intact in all 4 Skin: moist, warm, no rashes  Assessment:     1. DM2, insulin-dependent, uncontrolled, without complications  2. Severe depression - improved    Plan:  Patient with improving depression and, implicit, sugars. CBGs still above target, so I advised her to: - Continue the same dose of Lantus 28 units - continue NovoLog insulin: 6 units with a small meal 8 units with a regular meal 10 units with a large meal or dinner -  inject 2 units of insulin with a snack, up to 3 snacks a day - increase the NovoLog correction scale::  - 150- 165: + 1 unit  - 166- 180: + 2 units  - 181- 195: + 3 units  - 196- 210: + 4 units  - 211- 225: + 5 units  - 226- 240: + 6 units  - 241- 255: + 7 units  - 256- 270: + 8  units  - 271- 285: + 9 units  - > 286: + 10 units - I advised her to keep the metformin at 500 mg twice a day - check Hba1C today - I will see the patient back in 2 months with her sugar log  Office Visit on 05/05/2013  Component Date Value Range Status  . Hemoglobin A1C 05/05/2013 8.3* 4.6 - 6.5 % Final   Glycemic Control Guidelines for People with Diabetes:Non Diabetic:  <6%Goal of Therapy: <7%Additional Action Suggested:  >8%    HbA1C increased a little, from 8%. Continue above plan.

## 2013-05-06 LAB — HEMOGLOBIN A1C: Hgb A1c MFr Bld: 8.3 % — ABNORMAL HIGH (ref 4.6–6.5)

## 2013-05-25 ENCOUNTER — Other Ambulatory Visit: Payer: Self-pay | Admitting: Internal Medicine

## 2013-09-11 ENCOUNTER — Encounter: Payer: Self-pay | Admitting: Internal Medicine

## 2013-09-11 ENCOUNTER — Ambulatory Visit (INDEPENDENT_AMBULATORY_CARE_PROVIDER_SITE_OTHER): Payer: 59 | Admitting: Internal Medicine

## 2013-09-11 VITALS — BP 152/80 | HR 90 | Temp 98.9°F | Ht 67.5 in | Wt 213.0 lb

## 2013-09-11 DIAGNOSIS — I10 Essential (primary) hypertension: Secondary | ICD-10-CM

## 2013-09-11 DIAGNOSIS — J40 Bronchitis, not specified as acute or chronic: Secondary | ICD-10-CM

## 2013-09-11 DIAGNOSIS — R059 Cough, unspecified: Secondary | ICD-10-CM

## 2013-09-11 DIAGNOSIS — F329 Major depressive disorder, single episode, unspecified: Secondary | ICD-10-CM

## 2013-09-11 DIAGNOSIS — IMO0001 Reserved for inherently not codable concepts without codable children: Secondary | ICD-10-CM

## 2013-09-11 DIAGNOSIS — E1165 Type 2 diabetes mellitus with hyperglycemia: Secondary | ICD-10-CM

## 2013-09-11 DIAGNOSIS — R05 Cough: Secondary | ICD-10-CM

## 2013-09-11 DIAGNOSIS — F3289 Other specified depressive episodes: Secondary | ICD-10-CM

## 2013-09-11 MED ORDER — LOSARTAN POTASSIUM 50 MG PO TABS: 50.0000 mg | ORAL_TABLET | Freq: Every day | ORAL | Status: DC

## 2013-09-11 MED ORDER — DOXYCYCLINE HYCLATE 100 MG PO TABS: 100.0000 mg | ORAL_TABLET | Freq: Two times a day (BID) | ORAL | Status: DC

## 2013-09-11 MED ORDER — HYDROCODONE-HOMATROPINE 5-1.5 MG/5ML PO SYRP: 5.0000 mL | ORAL_SOLUTION | Freq: Two times a day (BID) | ORAL | Status: DC | PRN

## 2013-09-11 NOTE — Patient Instructions (Signed)
Please contact our office if your symptoms do not improve or gets worse. Please complete the following lab tests before your next follow up appointment: BMET, A1c - 250.02

## 2013-09-11 NOTE — Assessment & Plan Note (Signed)
Patient also reports stopping her Cymbalta. She plans to establish with new psychiatrist.

## 2013-09-11 NOTE — Assessment & Plan Note (Signed)
Patient stopped her medications.   Patient advised to restarted diabetic medication.  Her last A1c was in April, 2014.  Obtain labs and reassess in 2 weeks.

## 2013-09-11 NOTE — Assessment & Plan Note (Signed)
Patient stop taking benazepril. Unclear whether ACE inhibitor contributing to her current cough. Switch to losartan 50 mg once daily. BP: 152/80 mmHg

## 2013-09-11 NOTE — Progress Notes (Signed)
Pre visit review using our clinic review tool, if applicable. No additional management support is needed unless otherwise documented below in the visit note. 

## 2013-09-11 NOTE — Assessment & Plan Note (Signed)
63 year old white female with history of diabetes experienced influenza symptoms in late December of 2014. Patient now has persistent cough and dyspnea with exertion. Obtain chest x-ray to rule out pneumonia. Her lung exam is fairly clear. Patient likely has bronchitis. Empiric treatment with doxycycline 100 mg twice daily for 10 days. Use Hycodan for cough as directed.  Patient advised to call office if symptoms persist or worsen.

## 2013-09-11 NOTE — Progress Notes (Signed)
Subjective:    Patient ID: Cassandra Morris, female    DOB: March 07, 1951, 63 y.o.   MRN: [id number]  HPI  63 year old white female with history of uncontrolled type 2 diabetes, hypertension and hyperlipidemia complains of persistent cough since January 5th. Patient reports she thinks she had influenza around Christmas of 2014. She reports experiencing fever and chills during Christmas holiday.  Her fever and chills resolved but she is experiencing intermittent night sweats. She now has nonproductive cough and feels exhausted.  Type 2 diabetes-patient reports she stopped her medications when she became ill.  She is not checking her blood sugars.  Hypertension-she discontinued her benazepril.  Review of Systems Dyspnea with exertion, negative for chest pain    Past Medical History  Diagnosis Date  . Depression   . Diabetes mellitus     T2DM  . Hyperlipidemia   . Obesity (BMI 30-39.9)     History   Social History  . Marital Status: Divorced    Spouse Name: N/A    Number of Children: N/A  . Years of Education: N/A   Occupational History  . Not on file.   Social History Main Topics  . Smoking status: Former Smoker    Quit date: 03/16/2010  . Smokeless tobacco: Never Used  . Alcohol Use: 0.0 oz/week  . Drug Use: No  . Sexual Activity: Not on file   Other Topics Concern  . Not on file   Social History Narrative   Caffeine use: daily, soda   Regular exercise: joined gym, cut back due to current health issues             No past surgical history on file.  Family History  Problem Relation Age of Onset  . Diabetes Mother     No Known Allergies  Current Outpatient Prescriptions on File Prior to Visit  Medication Sig Dispense Refill  . benazepril (LOTENSIN) 10 MG tablet Take 1 tablet (10 mg total) by mouth daily.  90 tablet  1  . Cyanocobalamin (VITAMIN B-12) 2500 MCG SUBL Place 2 each under the tongue daily.        . DULoxetine (CYMBALTA) 30 MG capsule TAKE 2  CAPSULES BY MOUTH ONCE DAILY  60 capsule  5  . glucose blood (ONETOUCH VERIO) test strip Use as instructed  100 each  12  . HUMALOG KWIKPEN 100 UNIT/ML SOPN INJECT 5 UNITS INTO THE SKIN 3 TIMES DAILY BEFORE MEALS.  15 mL  1  . LANTUS SOLOSTAR 100 UNIT/ML SOPN INJECT 30 UNITS INTO THE SKIN AT BEDTIME.  30 mL  1  . metFORMIN (GLUCOPHAGE-XR) 500 MG 24 hr tablet TAKE 1 TABLET BY MOUTH 2 TIMES DAILY.  60 tablet  3  . pravastatin (PRAVACHOL) 40 MG tablet TAKE 1 TABLET BY MOUTH ONCE DAILY  90 tablet  3  . UNIFINE PENTIPS 31G X 8 MM MISC USE 4 TIMES DAILY  100 each  5   No current facility-administered medications on file prior to visit.    BP 152/80  Pulse 90  Temp(Src) 98.9 F (37.2 C) (Oral)  Ht 5' 7.5" (1.715 m)  Wt 213 lb (96.616 kg)  BMI 32.85 kg/m2  SpO2 98%    Objective:   Physical Exam  Constitutional: She is oriented to person, place, and time. She appears well-developed and well-nourished. No distress.  HENT:  Head: Normocephalic and atraumatic.  Right Ear: External ear normal.  Left Ear: External ear normal.  Mouth/Throat: No oropharyngeal exudate.  oropharyngeal  erythema  Neck: Neck supple.  Cardiovascular: Normal rate, regular rhythm and normal heart sounds.   No murmur heard. Pulmonary/Chest: Effort normal and breath sounds normal. She has no wheezes. She has no rales.  Musculoskeletal: She exhibits no edema.  Lymphadenopathy:    She has no cervical adenopathy.  Neurological: She is alert and oriented to person, place, and time. No cranial nerve deficit.  Skin: Skin is warm and dry.  Psychiatric: She has a normal mood and affect. Her behavior is normal.          Assessment & Plan:

## 2013-09-12 ENCOUNTER — Telehealth: Payer: Self-pay | Admitting: Internal Medicine

## 2013-09-12 NOTE — Telephone Encounter (Signed)
Relevant patient education assigned to patient using Emmi. ° °

## 2013-09-14 ENCOUNTER — Ambulatory Visit (INDEPENDENT_AMBULATORY_CARE_PROVIDER_SITE_OTHER)
Admission: RE | Admit: 2013-09-14 | Discharge: 2013-09-14 | Disposition: A | Discharge status: Home / Self Care | Payer: 59 | Source: Ambulatory Visit | Attending: Internal Medicine | Admitting: Internal Medicine

## 2013-09-14 DIAGNOSIS — R05 Cough: Secondary | ICD-10-CM

## 2013-09-14 DIAGNOSIS — R059 Cough, unspecified: Secondary | ICD-10-CM

## 2013-09-15 ENCOUNTER — Telehealth: Payer: Self-pay

## 2013-09-15 NOTE — Telephone Encounter (Signed)
Relevant patient education assigned to patient using Emmi. ° °

## 2013-10-09 ENCOUNTER — Other Ambulatory Visit (INDEPENDENT_AMBULATORY_CARE_PROVIDER_SITE_OTHER): Payer: 59

## 2013-10-09 DIAGNOSIS — F3175 Bipolar disorder, in partial remission, most recent episode depressed: Secondary | ICD-10-CM

## 2013-10-09 DIAGNOSIS — IMO0002 Reserved for concepts with insufficient information to code with codable children: Secondary | ICD-10-CM

## 2013-10-09 DIAGNOSIS — I10 Essential (primary) hypertension: Secondary | ICD-10-CM

## 2013-10-09 DIAGNOSIS — Z79899 Other long term (current) drug therapy: Secondary | ICD-10-CM

## 2013-10-09 LAB — HEPATIC FUNCTION PANEL
ALBUMIN: 3.2 g/dL — AB (ref 3.5–5.2)
ALT: 12 U/L (ref 0–35)
AST: 12 U/L (ref 0–37)
Alkaline Phosphatase: 70 U/L (ref 39–117)
BILIRUBIN DIRECT: 0.1 mg/dL (ref 0.0–0.3)
BILIRUBIN TOTAL: 0.3 mg/dL (ref 0.2–1.2)
Indirect Bilirubin: 0.2 mg/dL (ref 0.2–1.2)
Total Protein: 5.7 g/dL — ABNORMAL LOW (ref 6.0–8.3)

## 2013-10-09 LAB — CBC WITH DIFFERENTIAL/PLATELET
BASOS ABS: 0.1 10*3/uL (ref 0.0–0.1)
Basophils Relative: 1 % (ref 0–1)
Eosinophils Absolute: 0.2 10*3/uL (ref 0.0–0.7)
Eosinophils Relative: 2 % (ref 0–5)
HCT: 36.9 % (ref 36.0–46.0)
Hemoglobin: 12.4 g/dL (ref 12.0–15.0)
LYMPHS ABS: 3.3 10*3/uL (ref 0.7–4.0)
LYMPHS PCT: 40 % (ref 12–46)
MCH: 29 pg (ref 26.0–34.0)
MCHC: 33.6 g/dL (ref 30.0–36.0)
MCV: 86.2 fL (ref 78.0–100.0)
Monocytes Absolute: 0.7 10*3/uL (ref 0.1–1.0)
Monocytes Relative: 9 % (ref 3–12)
Neutro Abs: 3.9 10*3/uL (ref 1.7–7.7)
Neutrophils Relative %: 48 % (ref 43–77)
Platelets: 231 10*3/uL (ref 150–400)
RBC: 4.28 MIL/uL (ref 3.87–5.11)
RDW: 13.2 % (ref 11.5–15.5)
WBC: 8.2 10*3/uL (ref 4.0–10.5)

## 2013-10-10 LAB — VALPROIC ACID LEVEL: Valproic Acid Lvl: 40 ug/mL — ABNORMAL LOW (ref 50.0–100.0)

## 2014-01-12 ENCOUNTER — Other Ambulatory Visit: Payer: Self-pay | Admitting: Internal Medicine

## 2014-03-05 ENCOUNTER — Telehealth: Payer: Self-pay | Admitting: Internal Medicine

## 2014-03-05 DIAGNOSIS — IMO0001 Reserved for inherently not codable concepts without codable children: Secondary | ICD-10-CM

## 2014-03-05 DIAGNOSIS — E1165 Type 2 diabetes mellitus with hyperglycemia: Principal | ICD-10-CM

## 2014-03-05 NOTE — Telephone Encounter (Signed)
lmom for pt to cb. Pt needs fasting diabetic bundle a1c,ldl and appt with md for follow up on blood pressure

## 2014-03-25 ENCOUNTER — Telehealth: Payer: Self-pay

## 2014-03-25 NOTE — Telephone Encounter (Signed)
Diabetic Bundle. Lvom for pt to call back and schedule appointment with Dr. Gherghe. 

## 2014-04-12 NOTE — Telephone Encounter (Signed)
lmom for pt to cb

## 2014-04-20 ENCOUNTER — Other Ambulatory Visit (INDEPENDENT_AMBULATORY_CARE_PROVIDER_SITE_OTHER): Payer: 59

## 2014-04-20 DIAGNOSIS — E1165 Type 2 diabetes mellitus with hyperglycemia: Principal | ICD-10-CM

## 2014-04-20 DIAGNOSIS — IMO0001 Reserved for inherently not codable concepts without codable children: Secondary | ICD-10-CM

## 2014-04-20 DIAGNOSIS — Z0000 Encounter for general adult medical examination without abnormal findings: Secondary | ICD-10-CM

## 2014-04-20 DIAGNOSIS — R7989 Other specified abnormal findings of blood chemistry: Secondary | ICD-10-CM

## 2014-04-20 LAB — CBC WITH DIFFERENTIAL/PLATELET
Basophils Absolute: 0.1 10*3/uL (ref 0.0–0.1)
Basophils Relative: 0.6 % (ref 0.0–3.0)
EOS PCT: 2.2 % (ref 0.0–5.0)
Eosinophils Absolute: 0.2 10*3/uL (ref 0.0–0.7)
HCT: 40.8 % (ref 36.0–46.0)
Hemoglobin: 13.9 g/dL (ref 12.0–15.0)
LYMPHS PCT: 46.3 % — AB (ref 12.0–46.0)
Lymphs Abs: 3.9 10*3/uL (ref 0.7–4.0)
MCHC: 34 g/dL (ref 30.0–36.0)
MCV: 87.5 fl (ref 78.0–100.0)
MONO ABS: 0.5 10*3/uL (ref 0.1–1.0)
MONOS PCT: 5.3 % (ref 3.0–12.0)
NEUTROS PCT: 45.6 % (ref 43.0–77.0)
Neutro Abs: 3.9 10*3/uL (ref 1.4–7.7)
PLATELETS: 235 10*3/uL (ref 150.0–400.0)
RBC: 4.66 Mil/uL (ref 3.87–5.11)
RDW: 12.5 % (ref 11.5–15.5)
WBC: 8.4 10*3/uL (ref 4.0–10.5)

## 2014-04-20 LAB — MICROALBUMIN / CREATININE URINE RATIO
Creatinine,U: 48.9 mg/dL
MICROALB UR: 0.3 mg/dL (ref 0.0–1.9)
MICROALB/CREAT RATIO: 0.6 mg/g (ref 0.0–30.0)

## 2014-04-20 LAB — POCT URINALYSIS DIPSTICK
BILIRUBIN UA: NEGATIVE
Blood, UA: NEGATIVE
LEUKOCYTES UA: NEGATIVE
Nitrite, UA: NEGATIVE
Protein, UA: NEGATIVE
Spec Grav, UA: 1.01
Urobilinogen, UA: NEGATIVE
pH, UA: 5

## 2014-04-20 LAB — HEMOGLOBIN A1C: HEMOGLOBIN A1C: 12 % — AB (ref 4.6–6.5)

## 2014-04-21 LAB — LIPID PANEL
CHOLESTEROL: 229 mg/dL — AB (ref 0–200)
HDL: 35.8 mg/dL — AB (ref >39.00)
NonHDL: 193.2
Total CHOL/HDL Ratio: 6
Triglycerides: 296 mg/dL — ABNORMAL HIGH (ref 0.0–149.0)
VLDL: 59.2 mg/dL — AB (ref 0.0–40.0)

## 2014-04-21 LAB — BASIC METABOLIC PANEL
BUN: 10 mg/dL (ref 6–23)
CHLORIDE: 96 meq/L (ref 96–112)
CO2: 25 mEq/L (ref 19–32)
Calcium: 8.9 mg/dL (ref 8.4–10.5)
Creatinine, Ser: 0.8 mg/dL (ref 0.4–1.2)
GFR: 81.67 mL/min (ref >60.00)
Glucose, Bld: 352 mg/dL — ABNORMAL HIGH (ref 70–99)
POTASSIUM: 4.6 meq/L (ref 3.5–5.1)
Sodium: 130 mEq/L — ABNORMAL LOW (ref 135–145)

## 2014-04-21 LAB — HEPATIC FUNCTION PANEL
ALT: 19 U/L (ref 0–35)
AST: 20 U/L (ref 0–37)
Albumin: 3.7 g/dL (ref 3.5–5.2)
Alkaline Phosphatase: 73 U/L (ref 39–117)
BILIRUBIN DIRECT: 0.1 mg/dL (ref 0.0–0.3)
BILIRUBIN TOTAL: 0.5 mg/dL (ref 0.2–1.2)
Total Protein: 6.6 g/dL (ref 6.0–8.3)

## 2014-04-21 LAB — LDL CHOLESTEROL, DIRECT: Direct LDL: 166.8 mg/dL

## 2014-04-21 LAB — TSH: TSH: 2.83 u[IU]/mL (ref 0.35–4.50)

## 2014-04-27 ENCOUNTER — Encounter: Payer: Self-pay | Admitting: Internal Medicine

## 2014-04-27 ENCOUNTER — Ambulatory Visit (INDEPENDENT_AMBULATORY_CARE_PROVIDER_SITE_OTHER): Payer: 59 | Admitting: Internal Medicine

## 2014-04-27 VITALS — BP 148/70 | HR 80 | Temp 98.4°F | Wt 204.0 lb

## 2014-04-27 DIAGNOSIS — I10 Essential (primary) hypertension: Secondary | ICD-10-CM

## 2014-04-27 DIAGNOSIS — E785 Hyperlipidemia, unspecified: Secondary | ICD-10-CM

## 2014-04-27 DIAGNOSIS — E1149 Type 2 diabetes mellitus with other diabetic neurological complication: Secondary | ICD-10-CM

## 2014-04-27 DIAGNOSIS — IMO0001 Reserved for inherently not codable concepts without codable children: Secondary | ICD-10-CM

## 2014-04-27 DIAGNOSIS — Z23 Encounter for immunization: Secondary | ICD-10-CM

## 2014-04-27 DIAGNOSIS — E1165 Type 2 diabetes mellitus with hyperglycemia: Principal | ICD-10-CM

## 2014-04-27 DIAGNOSIS — E1142 Type 2 diabetes mellitus with diabetic polyneuropathy: Secondary | ICD-10-CM

## 2014-04-27 MED ORDER — METFORMIN HCL ER 500 MG PO TB24: ORAL_TABLET | ORAL | Status: DC

## 2014-04-27 MED ORDER — LOSARTAN POTASSIUM 50 MG PO TABS: 50.0000 mg | ORAL_TABLET | Freq: Every day | ORAL | Status: DC

## 2014-04-27 MED ORDER — ATORVASTATIN CALCIUM 20 MG PO TABS: 20.0000 mg | ORAL_TABLET | Freq: Every day | ORAL | Status: DC

## 2014-04-27 MED ORDER — INSULIN LISPRO 100 UNIT/ML (KWIKPEN): PEN_INJECTOR | SUBCUTANEOUS | Status: DC

## 2014-04-27 MED ORDER — INSULIN GLARGINE 100 UNIT/ML SOLOSTAR PEN: 40.0000 [IU] | PEN_INJECTOR | Freq: Every day | SUBCUTANEOUS | Status: DC

## 2014-04-27 NOTE — Assessment & Plan Note (Signed)
BP not at goal.  I encouraged mediation compliance.  Restart Losartan.

## 2014-04-27 NOTE — Patient Instructions (Signed)
Bring blood sugar log to your next appointment with endocrinologist Please take your medications and insulin on a regular basis.

## 2014-04-27 NOTE — Assessment & Plan Note (Signed)
Switch to atorvastatin.  DC pravastatin.  Medical and dietary compliance encouraged.

## 2014-04-27 NOTE — Progress Notes (Signed)
Subjective:    Patient ID: Cassandra Morris, female    DOB: [birth year], 63 y.o.   MRN: [id number]  HPI  63 year old white female with history of uncontrolled type 2 diabetes, hyperlipidemia, hypertension and depression for followup. It has been almost 63 months since her previous visit. Patient reports poor compliance with her medications and insulin since previous visit. She reports difficulty with medications prescribed by her psychiatrist. She was tried on Depakote Lamictal and Seroquel. Her psychiatric symptoms worsened and she experienced significant side effects.  She stopped all her psychiatric medications.  Type 2 diabetes - she restarted her insulin use approximately one week ago. She did not use her insulin for 2-3 months. Her morning blood sugars are between 250 and 300.  She was previously seen by endocrinologist - Dr. Cruzita Lederer.  Hypertension-she has not been taking her Losartan.  Hyperlipidemia she has not been taking Pravachol.   Review of Systems Negative for chest pain, negative for hypoglycemia     Past Medical History  Diagnosis Date  . Depression   . Diabetes mellitus     T2DM  . Hyperlipidemia   . Obesity (BMI 30-39.9)     History   Social History  . Marital Status: Divorced    Spouse Name: N/A    Number of Children: N/A  . Years of Education: N/A   Occupational History  . Not on file.   Social History Main Topics  . Smoking status: Former Smoker    Quit date: 03/16/2010  . Smokeless tobacco: Never Used  . Alcohol Use: 0.0 oz/week  . Drug Use: No  . Sexual Activity: Not on file   Other Topics Concern  . Not on file   Social History Narrative   Caffeine use: daily, soda   Regular exercise: joined gym, cut back due to current health issues             No past surgical history on file.  Family History  Problem Relation Age of Onset  . Diabetes Mother     No Known Allergies  Current Outpatient Prescriptions on File Prior to Visit    Medication Sig Dispense Refill  . Cyanocobalamin (VITAMIN B-12) 2500 MCG SUBL Place 2 each under the tongue daily.        Glory Rosebush VERIO test strip USE AS INSTRUCTED  100 each  12  . UNIFINE PENTIPS 31G X 8 MM MISC USE 4 TIMES DAILY  100 each  5  . HYDROcodone-homatropine (HYCODAN) 5-1.5 MG/5ML syrup Take 5 mLs by mouth every 12 (twelve) hours as needed for cough.  120 mL  0   No current facility-administered medications on file prior to visit.    BP 148/70  Pulse 80  Temp(Src) 98.4 F (36.9 C)  Wt 204 lb (92.534 kg)    Objective:   Physical Exam  Constitutional: She is oriented to person, place, and time. She appears well-developed and well-nourished. No distress.  HENT:  Head: Normocephalic and atraumatic.  Slight head tremor  Eyes: EOM are normal. Pupils are equal, round, and reactive to light.  Neck: Neck supple.  No carotid bruit  Cardiovascular: Normal rate, regular rhythm and normal heart sounds.   No murmur heard. Pulmonary/Chest: Effort normal and breath sounds normal. She has no wheezes.  Lymphadenopathy:    She has no cervical adenopathy.  Neurological: She is alert and oriented to person, place, and time. No cranial nerve deficit.  Psychiatric: She has a normal mood and affect. Her behavior  is normal.          Assessment & Plan:

## 2014-04-27 NOTE — Assessment & Plan Note (Signed)
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 I stressed the importance of improving blood sugar control.

## 2014-04-27 NOTE — Assessment & Plan Note (Signed)
Patient's diabetes control complicated by her psychiatric issues. She had numerous side effects to psychiatric medications. Patient recently restarted her insulin.  Increase basal insulin by 10 units continue same dose of mealtime insulin. Patient encouraged to followup with her endocrinologist. Patient understands to keep blood sugar log to bring to her office visit with endocrinology.  Lab Results  Component Value Date   HGBA1C 12.0* 04/20/2014   HGBA1C 8.3* 05/05/2013   HGBA1C 11.9* 10/24/2012   Lab Results  Component Value Date   MICROALBUR 0.3 04/20/2014   LDLCALC 133* 11/29/2008   CREATININE 0.8 04/20/2014

## 2014-05-13 ENCOUNTER — Encounter: Payer: Self-pay | Admitting: Internal Medicine

## 2014-05-13 ENCOUNTER — Ambulatory Visit (INDEPENDENT_AMBULATORY_CARE_PROVIDER_SITE_OTHER): Payer: 59 | Admitting: Internal Medicine

## 2014-05-13 ENCOUNTER — Telehealth: Payer: Self-pay | Admitting: *Deleted

## 2014-05-13 VITALS — BP 120/62 | HR 92 | Temp 98.5°F | Resp 12 | Wt 208.0 lb

## 2014-05-13 DIAGNOSIS — IMO0001 Reserved for inherently not codable concepts without codable children: Secondary | ICD-10-CM

## 2014-05-13 DIAGNOSIS — E1165 Type 2 diabetes mellitus with hyperglycemia: Principal | ICD-10-CM

## 2014-05-13 MED ORDER — GLUMETZA 500 MG PO TB24: 500.0000 mg | ORAL_TABLET | Freq: Two times a day (BID) | ORAL | Status: DC

## 2014-05-13 NOTE — Telephone Encounter (Signed)
I believe we can try with coupons - I am not sure what "the cost comes out of the [employer]" means.Marland KitchenMarland Kitchen

## 2014-05-13 NOTE — Telephone Encounter (Signed)
Pharmacist from [place] called stating that he was unsure if Dr Cruzita Lederer knew that the cost of the [facility] is $2800 for a month supply for pt. Since we are self-insured here at [facility], he wanted to know if there is another option for the pt. He can get the cost down to zero for the pt with coupons, but the cost comes out of the [employer]. He is just trying to see what other options there are for the pt. Please advise.

## 2014-05-13 NOTE — Patient Instructions (Signed)
Please continue Lantus 40 units - but move this close to bedtime (or after dinner if you think you forget to take it at bedtime) Increase Humalog to: 10 units with a small meal 12 units with a large meal Continue current SSI of Humalog - 150- 165: + 1 unit  - 166- 180: + 2 units  - 181- 195: + 3 units  - 196- 210: + 4 units  - 211- 225: + 5 units  - 226- 240: + 6 units  - 241- 255: + 7 units  - 256- 270: + 8 units  - 271- 285: + 9 units  - > 286: + 10 units  - Switch from metformin XR to Glumetza and increase the dose to 500 mg 2x a day  Please return in 1.5 months with your sugar log.

## 2014-05-13 NOTE — Progress Notes (Signed)
Subjective:     Patient ID: Cassandra Morris, female   DOB: [birth year], 63 y.o.   MRN: [id number]  HPI Cassandra Morris is a pleasant 63 y.o. woman, returning for f/u for DM2, dx 2011, insulin-dependent, uncontrolled, without complications.  Last visit 1 year ago.  Pt has severe depression, which she has every summer >> cannot control her sugars well.  She was off insulin for 2-3 mo over the summer!! She saw a psychiatrist in 11/2013 >> started Depakote and Lamictal >> she tapered these off as she felt terrible on these.   Lab Results  Component Value Date   HGBA1C 12.0* 04/20/2014  [facility]   She is now on a regimen of - started back 04/27/2014: - Lantus 30 >> 40 units in am - Humalog 8-8-8 before meals + SSI -- 150- 165: + 1 unit  - 166- 180: + 2 units  - 181- 195: + 3 units  - 196- 210: + 4 units  - 211- 225: + 5 units  - 226- 240: + 6 units  - 241- 255: + 7 units  - 256- 270: + 8 units  - 271- 285: + 9 units  - > 286: + 10 units - metformin XR 500 mg in am (we tried to increase, but this gave her diarrhea) She mentions that she does take her insulin doses consistently along with her metformin.  She checks her sugars several times a day - 4x a day >> no log. Overall, her sugars are worse: - am: 130-185 >> 150-180, 200s when forgets Lantus >>180-190 >> 180 >> 186-220, 252 - 2h after b'fast: 110-190s >> 170-200 >> not checking >> 150-170 >> 223-209, 310 - prelunch: 90-190 >> 140s >> 170s >> 150s >> 151, 202-290 - 2h postlunch: 99-191 >> 170-200 >> 200s >> 180-200 >> 126-175 - predinner: 90-149 >> 90-185 >> 200s >> 150-180 >> 89, 182 - 2h post dinner: 118-168 >> not checking >> 200 >> 285 - bedtime: 135-187 >> not checking >> 200 (grazes) >> 242  - No CKD: Lab Results  Component Value Date   TSH 2.83 04/20/2014   CREATININE 0.8 04/20/2014  - No neuropathy.  - no HL: Lab Results  Component Value Date   CHOL 229* 04/20/2014   HDL 35.80* 04/20/2014   LDLCALC 133* 11/29/2008   LDLDIRECT 166.8 04/20/2014   TRIG 296.0* 04/20/2014   CHOLHDL 6 04/20/2014  - She had an eye exam in 06/2012. She has a h/o iritis.   She also has a medical history of hypertension, hyperlipidemia, obesity.  I reviewed pt's medications, allergies, PMH, social hx, family hx and no changes required, except as mentioned above.   Review of Systems Constitutional: no weight gain, + fatigue, no subjective hyperthermia, + nocturia, + excessive urination Eyes: + blurry vision, no xerophthalmia ENT: no sore throat, no nodules palpated in throat, no dysphagia/odynophagia, no hoarseness, + decreased hearing Cardiovascular: no CP/SOB/palpitations/leg swelling Respiratory: no cough/SOB Gastrointestinal: no N/VD/C Musculoskeletal: + muscle aches/no joint aches Neuro: + HAs Psychiatric: + both: depression/anxiety  Objective:   Physical Exam BP 120/62  Pulse 92  Temp(Src) 98.5 F (36.9 C) (Oral)  Resp 12  Wt 208 lb (94.348 kg)  SpO2 96% Wt Readings from Last 3 Encounters:  05/13/14 208 lb (94.348 kg)  04/27/14 204 lb (92.534 kg)  09/11/13 213 lb (96.616 kg)  Constitutional: overweight, in NAD Eyes: unequal pupils L>R - surgical R pupil, EOMI, no exophthalmos ENT: moist  mucous membranes, no thyromegaly, no cervical lymphadenopathy Cardiovascular: RRR, No MRG Respiratory: CTA B Gastrointestinal: abdomen soft, NT, ND, BS+ Musculoskeletal: no deformities, strength intact in all 4 Neuro:  + mild tremor with outstretched hands Skin: moist, warm, no rashes  Assessment:     1. DM2, insulin-dependent, uncontrolled, without complications     Plan:     Patient with improving depression but with a bad period of depression and intolerance to psychiatric meds this year. She was off insulin over the summer >> HbA1c increased to 12%! She has now restarted to check sugars and take her meds. Sugars are still high, improving only around midday. I advised her to: Patient  Instructions  Please continue Lantus 40 units - but move this close to bedtime (or after dinner if you think you forget to take it at bedtime) Increase Humalog to: 10 units with a small meal 12 units with a large meal Continue current SSI of Humalog - 150- 165: + 1 unit  - 166- 180: + 2 units  - 181- 195: + 3 units  - 196- 210: + 4 units  - 211- 225: + 5 units  - 226- 240: + 6 units  - 241- 255: + 7 units  - 256- 270: + 8 units  - 271- 285: + 9 units  - > 286: + 10 units  - Switch from metformin XR to Glumetza and increase the dose to 500 mg 2x a day  Please return in 1.5 months with your sugar log.   - had a PNA vaccine 04/27/2014 - given more sugar logs - I will see the patient back in 1.5 months with her sugar log

## 2014-05-20 ENCOUNTER — Telehealth: Payer: Self-pay | Admitting: Internal Medicine

## 2014-05-20 NOTE — Telephone Encounter (Signed)
I see - let's continue the Metformin XR 500 mg once a day as she was taking it before (not brand name).

## 2014-05-20 NOTE — Telephone Encounter (Signed)
FYI patient had to decline the new med due to the high cost

## 2014-05-20 NOTE — Telephone Encounter (Signed)
Pt had to decline the Glumetza due to the high cost. Please advise.

## 2014-05-21 NOTE — Telephone Encounter (Signed)
lmtcb

## 2014-05-25 ENCOUNTER — Other Ambulatory Visit: Payer: Self-pay | Admitting: *Deleted

## 2014-06-21 ENCOUNTER — Ambulatory Visit (INDEPENDENT_AMBULATORY_CARE_PROVIDER_SITE_OTHER): Payer: 59 | Admitting: Family Medicine

## 2014-06-21 ENCOUNTER — Encounter: Payer: Self-pay | Admitting: Family Medicine

## 2014-06-21 VITALS — BP 130/62 | HR 82 | Temp 98.3°F | Ht 67.5 in | Wt 209.0 lb

## 2014-06-21 DIAGNOSIS — J019 Acute sinusitis, unspecified: Secondary | ICD-10-CM

## 2014-06-21 MED ORDER — AZITHROMYCIN 250 MG PO TABS: ORAL_TABLET | ORAL | Status: DC

## 2014-06-21 NOTE — Progress Notes (Signed)
   Subjective:    Patient ID: Azucena Kuba, female    DOB: 1951-07-22, 63 y.o.   MRN: [id number]  HPI Here for one week of sinus pressure, PND, ST,and a dry cough. No fever.    Review of Systems  Constitutional: Negative.   HENT: Positive for congestion, postnasal drip and sinus pressure.   Eyes: Negative.   Respiratory: Positive for cough.        Objective:   Physical Exam  Constitutional: She appears well-developed and well-nourished.  HENT:  Right Ear: External ear normal.  Left Ear: External ear normal.  Nose: Nose normal.  Mouth/Throat: Oropharynx is clear and moist.  Eyes: Conjunctivae are normal.  Pulmonary/Chest: Effort normal and breath sounds normal.  Lymphadenopathy:    She has no cervical adenopathy.          Assessment & Plan:  Add Mucinex

## 2014-06-21 NOTE — Progress Notes (Signed)
Pre visit review using our clinic review tool, if applicable. No additional management support is needed unless otherwise documented below in the visit note. 

## 2014-06-29 ENCOUNTER — Encounter: Payer: Self-pay | Admitting: Internal Medicine

## 2014-06-29 ENCOUNTER — Ambulatory Visit (INDEPENDENT_AMBULATORY_CARE_PROVIDER_SITE_OTHER): Payer: 59 | Admitting: Internal Medicine

## 2014-06-29 VITALS — BP 122/64 | HR 93 | Temp 98.9°F | Resp 12 | Wt 207.0 lb

## 2014-06-29 DIAGNOSIS — IMO0002 Reserved for concepts with insufficient information to code with codable children: Secondary | ICD-10-CM

## 2014-06-29 DIAGNOSIS — E1165 Type 2 diabetes mellitus with hyperglycemia: Secondary | ICD-10-CM

## 2014-06-29 NOTE — Progress Notes (Signed)
Subjective:     Patient ID: Cassandra Morris, female   DOB: Aug 12, 1951, 63 y.o.   MRN: [id number]  HPI [name] is a pleasant 63 y.o. woman, returning for f/u for DM2, dx 2011, insulin-dependent, uncontrolled, without complications. Last visit 1.5 mo ago.  She had a URI >> Z pack.  She started a vegan diet >> she loves it. Sugars are much better. She feels great!  Pt has severe depression, which she has every summer >> cannot control her sugars well.  She was off insulin for 2-3 mo over the summer!!  Lab Results  Component Value Date   HGBA1C 12.0* 04/20/2014  [facility]% [facility]   She is now on a regimen of - started back 04/27/2014: Lantus 40 units at bedtime Humalog : 10 units with a small meal 12 units with a large meal SSI of Humalog - 150- 165: + 1 unit  - 166- 180: + 2 units  - 181- 195: + 3 units  - 196- 210: + 4 units  - 211- 225: + 5 units  - 226- 240: + 6 units  - 241- 255: + 7 units  - 256- 270: + 8 units  - 271- 285: + 9 units  - > 286: + 10 units  Metformin XR 500 mg 2x a day (more >> diarrhea) She mentions that she does take her insulin doses consistently along with her metformin.  She checks her sugars several times a day - 4x a day >> no log. Overall, her sugars are much better: - am: 130-185 >> 150-180, 200s when forgets Lantus >>180-190 >> 180 >> 186-220, 252 >> 110-150 - 2h after b'fast: 110-190s >> 170-200 >> not checking >> 150-170 >> 223-209, 310 >> 127-190s, 237 - prelunch: 90-190 >> 140s >> 170s >> 150s >> 151, 202-290 >> 117-193, 217 - 2h postlunch: 99-191 >> 170-200 >> 200s >> 180-200 >> 126-175 >> 62, 92-178 - predinner: 90-149 >> 90-185 >> 200s >> 150-180 >> 89, 182 >> 79-155, 170 - 2h post dinner: 118-168 >> not checking >> 200 >> 285 >>123-168 - bedtime: 135-187 >> not checking >> 200 (grazes) >> 242 >> 144-195  - No CKD: Lab Results  Component Value Date   TSH 2.83 04/20/2014   CREATININE 0.8 04/20/2014  - No  neuropathy.  - no HL: Lab Results  Component Value Date   CHOL 229* 04/20/2014   HDL 35.80* 04/20/2014   LDLCALC 133* 11/29/2008   LDLDIRECT 166.8 04/20/2014   TRIG 296.0* 04/20/2014   CHOLHDL 6 04/20/2014  - She had an eye exam in 06/2012. She has a h/o iritis.   She also has a medical history of hypertension, hyperlipidemia, obesity.She saw a psychiatrist in 11/2013 >> started Depakote and Lamictal >> she tapered these off as she felt terrible on these.   I reviewed pt's medications, allergies, PMH, social hx, family hx and no changes required, except as mentioned above.   Review of Systems Constitutional: no weight gain, + fatigue, no subjective hyperthermia, + nocturia, + excessive urination Eyes: no blurry vision, no xerophthalmia ENT: no sore throat, no nodules palpated in throat, no dysphagia/odynophagia, no hoarseness, + decreased hearing Cardiovascular: no CP/SOB/palpitations/leg swelling Respiratory: + cough/no SOB Gastrointestinal: no N/VD/C Musculoskeletal: no muscle aches/no joint aches Neuro: + HAs Psychiatric: + both: depression/anxiety  Objective:   Physical Exam BP 122/64 mmHg  Pulse 93  Temp(Src) 98.9 F (37.2 C) (Oral)  Resp 12  Wt 207 lb (93.895  kg)  SpO2 96% Wt Readings from Last 3 Encounters:  06/29/14 207 lb (93.895 kg)  06/21/14 209 lb (94.802 kg)  05/13/14 208 lb (94.348 kg)  Constitutional: overweight, in NAD Eyes: unequal pupils L>R - surgical R pupil, EOMI, no exophthalmos ENT: moist mucous membranes, no thyromegaly, no cervical lymphadenopathy Cardiovascular: RRR, No MRG Respiratory: CTA B Gastrointestinal: abdomen soft, NT, ND, BS+ Musculoskeletal: no deformities, strength intact in all 4 Neuro:  + mild tremor with outstretched hands Skin: moist, warm, no rashes  Assessment:     1. DM2, insulin-dependent, uncontrolled, without complications     Plan:     Patient with improving depression but with a bad period of depression and  intolerance to psychiatric meds this year. She was off insulin over the summer >> HbA1c increased to 12%! She has now restarted to check sugars and take her meds and also started a vegan diet that ~halved her sugars - she can fell off the wagon, but she gets back on it, she mentions.  Patient Instructions   Patient Instructions  Please continue Lantus 40 units at bedtime Continue Humalog to: 10 units with a small meal 12 units with a large meal Continue current SSI of Humalog - 150- 165: + 1 unit  - 166- 180: + 2 units  - 181- 195: + 3 units  - 196- 210: + 4 units  - 211- 225: + 5 units  - 226- 240: + 6 units  - 241- 255: + 7 units  - 256- 270: + 8 units  - 271- 285: + 9 units  - > 286: + 10 units  Continue metformin XR 500 mg 2x a day.  KEEP UP THE GREAT WORK!!!!!  Please return in 2 months with your sugar log.   - had a PNA vaccine 04/27/2014 - no labs for now - I will see the patient back in 2 months with her sugar log - will check HbA1c at next check

## 2014-06-29 NOTE — Patient Instructions (Addendum)
Patient Instructions  Please continue Lantus 40 units at bedtime Continue Humalog to: 10 units with a small meal 12 units with a large meal Continue current SSI of Humalog - 150- 165: + 1 unit  - 166- 180: + 2 units  - 181- 195: + 3 units  - 196- 210: + 4 units  - 211- 225: + 5 units  - 226- 240: + 6 units  - 241- 255: + 7 units  - 256- 270: + 8 units  - 271- 285: + 9 units  - > 286: + 10 units  Continue metformin XR 500 mg 2x a day.  KEEP UP THE GREAT WORK!!!!!  Please return in 2 months with your sugar log.

## 2014-07-08 ENCOUNTER — Other Ambulatory Visit: Payer: 59

## 2014-07-14 ENCOUNTER — Ambulatory Visit: Payer: 59 | Admitting: Internal Medicine

## 2014-07-15 ENCOUNTER — Ambulatory Visit: Payer: 59 | Admitting: Internal Medicine

## 2014-07-16 ENCOUNTER — Other Ambulatory Visit (INDEPENDENT_AMBULATORY_CARE_PROVIDER_SITE_OTHER): Payer: 59

## 2014-07-16 DIAGNOSIS — I10 Essential (primary) hypertension: Secondary | ICD-10-CM

## 2014-07-16 DIAGNOSIS — E785 Hyperlipidemia, unspecified: Secondary | ICD-10-CM

## 2014-07-16 LAB — LIPID PANEL
Cholesterol: 123 mg/dL (ref 0–200)
HDL: 47.7 mg/dL (ref >39.00)
LDL Cholesterol: 61 mg/dL (ref 0–99)
NONHDL: 75.3
TRIGLYCERIDES: 71 mg/dL (ref 0.0–149.0)
Total CHOL/HDL Ratio: 3
VLDL: 14.2 mg/dL (ref 0.0–40.0)

## 2014-07-16 LAB — HEPATIC FUNCTION PANEL
ALBUMIN: 3.8 g/dL (ref 3.5–5.2)
ALT: 13 U/L (ref 0–35)
AST: 13 U/L (ref 0–37)
Alkaline Phosphatase: 65 U/L (ref 39–117)
Bilirubin, Direct: 0 mg/dL (ref 0.0–0.3)
TOTAL PROTEIN: 6.6 g/dL (ref 6.0–8.3)
Total Bilirubin: 0.6 mg/dL (ref 0.2–1.2)

## 2014-07-16 LAB — BASIC METABOLIC PANEL
BUN: 11 mg/dL (ref 6–23)
CALCIUM: 9 mg/dL (ref 8.4–10.5)
CO2: 27 mEq/L (ref 19–32)
Chloride: 105 mEq/L (ref 96–112)
Creatinine, Ser: 0.9 mg/dL (ref 0.4–1.2)
GFR: 71.72 mL/min (ref >60.00)
Glucose, Bld: 150 mg/dL — ABNORMAL HIGH (ref 70–99)
Potassium: 4.5 mEq/L (ref 3.5–5.1)
SODIUM: 139 meq/L (ref 135–145)

## 2014-07-23 ENCOUNTER — Ambulatory Visit: Payer: 59 | Admitting: Internal Medicine

## 2014-07-28 ENCOUNTER — Encounter: Payer: Self-pay | Admitting: Internal Medicine

## 2014-07-28 ENCOUNTER — Ambulatory Visit (INDEPENDENT_AMBULATORY_CARE_PROVIDER_SITE_OTHER): Payer: 59 | Admitting: Internal Medicine

## 2014-07-28 VITALS — BP 110/76 | Temp 98.8°F | Wt 210.0 lb

## 2014-07-28 DIAGNOSIS — F32A Depression, unspecified: Secondary | ICD-10-CM

## 2014-07-28 DIAGNOSIS — IMO0002 Reserved for concepts with insufficient information to code with codable children: Secondary | ICD-10-CM

## 2014-07-28 DIAGNOSIS — F329 Major depressive disorder, single episode, unspecified: Secondary | ICD-10-CM

## 2014-07-28 DIAGNOSIS — I10 Essential (primary) hypertension: Secondary | ICD-10-CM

## 2014-07-28 DIAGNOSIS — E1165 Type 2 diabetes mellitus with hyperglycemia: Secondary | ICD-10-CM

## 2014-07-28 NOTE — Assessment & Plan Note (Addendum)
Blood sugar control significantly improved since restarting insulin therapy and better dietary compliance.  Check A1c.  Refer to ophthalmologist for diabetic eye exam. Patient received flu vaccine on 05/24/2014.  Diabetic foot exam notable for slight decrease in sensation to temperature and vibration. She likely has mild diabetic neuropathy.  Lab Results  Component Value Date   HGBA1C 12.0* 04/20/2014   HGBA1C 8.3* 05/05/2013   HGBA1C 11.9* 10/24/2012   Lab Results  Component Value Date   MICROALBUR 0.3 04/20/2014   LDLCALC 61 07/16/2014   CREATININE 0.9 07/16/2014

## 2014-07-28 NOTE — Assessment & Plan Note (Signed)
Improved.  Good response to Latuda and low dose Risperidone.

## 2014-07-28 NOTE — Assessment & Plan Note (Signed)
BP at goal. BP: 110/76 mmHg

## 2014-07-28 NOTE — Progress Notes (Signed)
   Subjective:    Patient ID: Cassandra Morris, female    DOB: 01/30/1951, 63 y.o.   MRN: [id number]  HPI  63 year old white female with history of uncontrolled type 2 diabetes hyperlipidemia, hypertension and depression for follow-up. Interval medical history-patient's blood sugar significantly improved.  She is working closely with her endocrinologist.  Depression-she reports her depressive symptoms are also much better.  She has responded well to [facility] and low dose risperidone.  Patient denies any side effects.  She is due for diabetic eye exam   Review of Systems She denies paresthesias, negative for chest pain    Past Medical History  Diagnosis Date  . Depression   . Diabetes mellitus     T2DM  . Hyperlipidemia   . Obesity (BMI 30-39.9)     History   Social History  . Marital Status: Divorced    Spouse Name: N/A    Number of Children: N/A  . Years of Education: N/A   Occupational History  . Not on file.   Social History Main Topics  . Smoking status: Former Smoker    Quit date: 03/16/2010  . Smokeless tobacco: Never Used  . Alcohol Use: 0.0 oz/week     Comment: rare  . Drug Use: No  . Sexual Activity: Not on file   Other Topics Concern  . Not on file   Social History Narrative   Caffeine use: daily, soda   Regular exercise: joined gym, cut back due to current health issues             No past surgical history on file.  Family History  Problem Relation Age of Onset  . Diabetes Mother     No Known Allergies  Current Outpatient Prescriptions on File Prior to Visit  Medication Sig Dispense Refill  . atorvastatin (LIPITOR) 20 MG tablet Take 1 tablet (20 mg total) by mouth daily. 90 tablet 1  . Cyanocobalamin (VITAMIN B-12) 2500 MCG SUBL Place 2 each under the tongue daily.      . Insulin Glargine (LANTUS SOLOSTAR) 100 UNIT/ML Solostar Pen Inject 40 Units into the skin daily at 10 pm. 30 mL 2  . insulin lispro (HUMALOG KWIKPEN) 100 UNIT/ML KiwkPen  Inject 10-15 units 3 times daily 15 minutes before meals 15 mL 3  . losartan (COZAAR) 50 MG tablet Take 1 tablet (50 mg total) by mouth daily. 90 tablet 1  . metFORMIN (GLUCOPHAGE-XR) 500 MG 24 hr tablet TAKE 1 TABLET BY MOUTH 2 TIMES DAILY. 180 tablet 1  . ONETOUCH VERIO test strip USE AS INSTRUCTED 100 each 12  . UNIFINE PENTIPS 31G X 8 MM MISC USE 4 TIMES DAILY 100 each 5   No current facility-administered medications on file prior to visit.    BP 110/76 mmHg  Temp(Src) 98.8 F (37.1 C) (Oral)  Wt 210 lb (95.255 kg)    Objective:   Physical Exam  Constitutional: She is oriented to person, place, and time. She appears well-developed and well-nourished. No distress.  Cardiovascular: Normal rate, regular rhythm and normal heart sounds.   No murmur heard. Pulmonary/Chest: Effort normal and breath sounds normal. She has no wheezes.  Musculoskeletal: She exhibits no edema.  Neurological: She is alert and oriented to person, place, and time.  No resting tremor  Psychiatric: She has a normal mood and affect. Her behavior is normal.          Assessment & Plan:

## 2014-07-29 LAB — HEMOGLOBIN A1C: HEMOGLOBIN A1C: 7.4 % — AB (ref 4.6–6.5)

## 2014-08-02 ENCOUNTER — Other Ambulatory Visit: Payer: Self-pay | Admitting: Internal Medicine

## 2014-08-30 ENCOUNTER — Ambulatory Visit: Payer: 59 | Admitting: Internal Medicine

## 2014-09-01 ENCOUNTER — Telehealth: Payer: Self-pay | Admitting: Internal Medicine

## 2014-09-01 NOTE — Telephone Encounter (Signed)
Rec'd from [place] forward 1 page to Dr. Shawna Orleans

## 2014-09-06 ENCOUNTER — Ambulatory Visit (INDEPENDENT_AMBULATORY_CARE_PROVIDER_SITE_OTHER): Payer: Self-pay | Admitting: Family Medicine

## 2014-09-06 VITALS — BP 132/70 | Wt 202.0 lb

## 2014-09-06 DIAGNOSIS — E1165 Type 2 diabetes mellitus with hyperglycemia: Secondary | ICD-10-CM

## 2014-09-06 DIAGNOSIS — IMO0002 Reserved for concepts with insufficient information to code with codable children: Secondary | ICD-10-CM

## 2014-09-06 NOTE — Assessment & Plan Note (Signed)
Subjective:  Patient presents today for a diabetes annual pharmacy visit as part of the employer-sponsored [facility] to [facility] program. Current diabetes regimen includes Lantus 40 units once daily and Humalog 10-16 units with meals. Patient also continues on daily ARB, and statin.   Patient states that she has been under increased stress lately. She states that she was diagnosed with bipolar disorder from Dr. Candis Schatz in early 2015 and was tried on several medications, including risperidone, lamotrigine and Latuda. She is now seeing a new provider, FNP Ali Lowe with the [facility]. According to patient she was misdiagnosed with Bipolar Disorder and now is diagnosed with Major Depressive Disorder. Risperidone and Latuda were both discontinued. Sertraline 50 mg once daily was started.  Patient reports that her anxiety and nervous feeling has improved since switching to sertraline last week. However, her hands were visibly shaking during the appointment and her voice was also shaky.  She is scheduled to have cataract surgery at the end of the month.  Last A1C was 7.2% in December. She was scheduled to have a visit with Dr. Cruzita Lederer last month but she missed the appointment because she was having so many other psychiatric issues.   Disease Assessments:  Diabetes: Type of Diabetes: Type 2; does not take an aspirin a day; Year of diagnosis 2011; MD managing Diabetes [place]; checks feet daily; checks blood glucose 3-4 times a day; Current Diabetes related medical conditions are High cholesterol; takes medications as prescribed; What is target A1c? <7.0% %; Sees Diabetes provider 2 times per year; uses glucometer Verio IQ glucometer; Highest CBG 348; Lowest CBG 75; hypoglycemia frequency none;   7 day CBG average 203; 14 day CBG average 238; 30 day CBG average 243;   Other Diabetes History:  Patient's appetite has decreased. Today she went low because she took Humalog and then was  called away from eating lunch and was unable to finish her lunch. She started to shake and CBG was 75.  Patient has been erratically checking blood sugar in the past, mainly after a meal. Most readings are between 200-300. She just recently d/c risperidone, but this medication could have been contributing to elevated blood sugar.  When she doesn't check blood sugar she is giving herself 10 units Humalog with meals. She admits that she forgets to take Lantus, maybe once a week. When that happens she takes Lantus 20 units in the morning and then 20 units in the evening to recover. She reports that she has had so many other psychiatric problems of late that she hasn't been taking care of diabetes.            Tobacco Assessment: Smoking Status: Former smoker; Last Reviewed: 09/06/2014   Social History:  Social work consult was not done; No drug use; Exercise adherence Never; Diet adherence 50-75% of the time; Patient can afford medications; Patient knows the purpose/use of medications; Pharmacy assist consult was ordered; Alcohol use:; Caffeine use:; Medication adherence adherent.  Occupation: works in Pharmacist, community at [employer]- Patient states that she hasn't been exercising at all. She states that 3-4 years ago she was physically active and was walking up to 5 miles daily. At this point she was vegan and reports that she had a lot more energy. She admits that she was feeling better when she was exercising. She reports that over the summer she stopped exercising because it was too hot. She is interested in starting exercise again.   Nutrition-  She admits that she has not been doing as well since she has been having "nerve problems". Weight is down 7 pounds since her last visit with Marcie Bal. She reports that food does not taste as good lately and she recognizes that her appetite has decreased. She previously followed a vegan lifestyle.    Preventive Care:       Dilated Eye Exam: 08/30/2014 is scheduled to have  Foot Exam: 07/28/2014 demonstrated mild bilateral neuropathy      Overall Health Assessments:  Nutritional Assessment:  Date of Last Nutritional Assessment: 09/18/2011  Quality of Life Assessment:  Date of Last Quality of Life Assessment: 09/18/2011  Psychosocial Assessment:  Date of Last Psychosocial Assessment: 09/18/2011  Functional Assessment:  Date of Last Functional Assessment: 09/18/2011  Falls Assessment/Gait Balance Evaluation:  Has patient fallen in the past 12 months? No  Does patient worry about falling? No  Patient does not have trouble navigating around the house.   Vital Signs:  09/06/2014 4:52 PM (EST)Blood Pressure 132 / 70 mm/Hg; Height 5 ft 7.5 in; Weight 202 lbs    Testing:  Blood Sugar Tests: Hemoglobin A1c: 7.2   Historical Lab Results:  HDL Cholesterol 41; LDL Cholesterol 119; Fasting - Whole Blood 140  Labs drawn on 12/22/12 at Dr. Lora Havens    Care Planning:  Learning Preference Assessment:  Learner: Patient  Readiness to Learn Barriers: None  Teaching Method: Explanation  Evaluation of Learning: Needs review and assistance  Readiness to Change:  How important is your health to you? 10  How confident are you in working to improve your health? 10  How ready are you to change to improve your health? 10  Total Score: 10  Care Plan:  09/06/2014 4:53 PM (EST) (3)  Problem: Checking CBG erratically  Role: Clinical Pharmacist  Long Term Goal Start checking CBG three times daily before meals and use this information to better calculate Humalog dose.  Date Started: 09/06/2014  09/06/2014 4:52 PM (EST) (1)  Problem: Improving Type II DM control as evidenced by significant decrease in A1C to 7.4% on 12/2 (down from 12.0% on 04/20/14)  Role: Care Management Coordinator  Long Term Goal Continued improvment in glycemic control as evidenced by A1C<7.4% at next [facility] visit     09/06/2014 4:18 PM  (EST) (2)  Problem: Physical Inactivity  Role: Clinical Pharmacist  Long Term Goal Extend afternoon walks with dogs to 20 minutes every day.     Assessment/Plan: Patient is a 64 year old female with DM2. Most recent A1C was 7.2% in December and is slightly above goal of less than 7%. Most recent readings from patient's CBG meter show worsening control, however, with majority of readings above 200 mg/dL. Patient has been under increased stress and anxiety over the past several weeks and hasn't been practicing good self management techniques. Discussed with patient ways that she can better manage blood sugars including more frequent CBG checks and timing checks to coincide with meals (previously checking in between meals). Discussed with patient using sliding scale to correct for existing hyperglycemia in addition to using base of 10 units Humalog with meals.   Patient previously was exercising and wants to begin again. She wants to start taking longer walks with her dogs, up to 20 minutes each day.   Patient will follow up with Marcie Bal in 2 months.  Goals for Next Visit- 1. When you are walking the dogs aim to walk for 20 minutes in the afternoons daily. 2.  Increase fruit and vegetable intake to 2 servings daily. Remember that when you eat fruit you will need to cover this with a few units of Humalog. 3. Check blood sugar before you eat breakfast, before lunch, and before dinner so that you can give yourself enough Humalog to cover for the food you are going to eat and also to reduce any existing elevated blood sugar. 4. In order to get your test strips for free, get them filled the same day as you fill either the Lantus or Humalog. Next appointment to see Marcie Bal is March 8th at 4 PM.

## 2014-10-27 ENCOUNTER — Telehealth: Payer: Self-pay | Admitting: *Deleted

## 2014-10-27 NOTE — Telephone Encounter (Signed)
Ok to proceed with cataract surgery

## 2014-10-27 NOTE — Telephone Encounter (Signed)
Left verbal message on Cassandra Morris's voicemail that pt was cleared for surgery

## 2014-10-27 NOTE — Telephone Encounter (Signed)
Tracey at Dr. Zenia Resides office called saying pt has cataract surgery on 11/04/14 and they wanting to know if pt was cleared for surgery.  Please advise if ok or if she needs an appt

## 2014-11-02 NOTE — Progress Notes (Signed)
Patient ID: Cassandra Morris, female   DOB: 04-15-1951, 64 y.o.   MRN: [id number] ATTENDING PHYSICIAN NOTE: I have reviewed the chart and agree with the plan as detailed above. Dorcas Mcmurray MD Pager [phone]

## 2014-11-29 ENCOUNTER — Ambulatory Visit (INDEPENDENT_AMBULATORY_CARE_PROVIDER_SITE_OTHER): Payer: 59 | Admitting: Internal Medicine

## 2014-11-29 ENCOUNTER — Encounter: Payer: Self-pay | Admitting: Internal Medicine

## 2014-11-29 VITALS — BP 130/70 | HR 68 | Temp 99.5°F | Wt 205.0 lb

## 2014-11-29 DIAGNOSIS — F32A Depression, unspecified: Secondary | ICD-10-CM

## 2014-11-29 DIAGNOSIS — F329 Major depressive disorder, single episode, unspecified: Secondary | ICD-10-CM

## 2014-11-29 DIAGNOSIS — E1165 Type 2 diabetes mellitus with hyperglycemia: Secondary | ICD-10-CM | POA: Diagnosis not present

## 2014-11-29 DIAGNOSIS — I10 Essential (primary) hypertension: Secondary | ICD-10-CM

## 2014-11-29 DIAGNOSIS — IMO0002 Reserved for concepts with insufficient information to code with codable children: Secondary | ICD-10-CM

## 2014-11-29 NOTE — Patient Instructions (Signed)
Monitor your blood pressure as directed Decrease your meal time insuline dose depending upon your carbohydrate intake Contact our office if you have persistent low blood sugars

## 2014-11-29 NOTE — Progress Notes (Signed)
Pre visit review using our clinic review tool, if applicable. No additional management support is needed unless otherwise documented below in the visit note. 

## 2014-11-29 NOTE — Assessment & Plan Note (Signed)
Followed by endocrinology. She is experiencing intermittent hypoglycemia before and after noon meal. Patient advised to adjust her mealtime insulin dose with breakfast and lunch.

## 2014-11-29 NOTE — Assessment & Plan Note (Signed)
Patient establish with new psychiatrist. Bipolar medications discontinued. Patient taking combination of sertraline, Wellbutrin SR and alprazolam.

## 2014-11-29 NOTE — Progress Notes (Signed)
Subjective:    Patient ID: Cassandra Morris, female    DOB: [birth year], 64 y.o.   MRN: [id number]  HPI  64 year old white female with history of uncontrolled type 2 diabetes, depression and hyperlipidemia for follow-up. Interval medical history-patient seen by new psychiatrist.  Her bipolar medications discontinued. Patient diagnosed with major depression. She is currently taking combination of sertraline, Wellbutrin SR and alprazolam.  Type 2 diabetes - patient occasionally has low blood sugar readings before and after lunch.  She is due for A1c.  Hypertension - stable   Review of Systems Negative for chest pain, Negative for paresthesias    Past Medical History  Diagnosis Date  . Depression   . Diabetes mellitus     T2DM  . Hyperlipidemia   . Obesity (BMI 30-39.9)     History   Social History  . Marital Status: Divorced    Spouse Name: N/A  . Number of Children: N/A  . Years of Education: N/A   Occupational History  . Not on file.   Social History Main Topics  . Smoking status: Former Smoker    Quit date: 03/16/2010  . Smokeless tobacco: Never Used  . Alcohol Use: 0.0 oz/week     Comment: rare  . Drug Use: No  . Sexual Activity: Not on file   Other Topics Concern  . Not on file   Social History Narrative   Caffeine use: daily, soda   Regular exercise: joined gym, cut back due to current health issues             No past surgical history on file.  Family History  Problem Relation Age of Onset  . Diabetes Mother     No Known Allergies  Current Outpatient Prescriptions on File Prior to Visit  Medication Sig Dispense Refill  . atorvastatin (LIPITOR) 20 MG tablet Take 1 tablet (20 mg total) by mouth daily. 90 tablet 1  . Cyanocobalamin (VITAMIN B-12) 2500 MCG SUBL Place 2 each under the tongue daily.      . Insulin Glargine (LANTUS SOLOSTAR) 100 UNIT/ML Solostar Pen Inject 40 Units into the skin daily at 10 pm. 30 mL 2  . insulin lispro (HUMALOG  KWIKPEN) 100 UNIT/ML KiwkPen Inject 10-15 units 3 times daily 15 minutes before meals 15 mL 3  . losartan (COZAAR) 50 MG tablet Take 1 tablet (50 mg total) by mouth daily. 90 tablet 1  . metFORMIN (GLUCOPHAGE-XR) 500 MG 24 hr tablet TAKE 1 TABLET BY MOUTH 2 TIMES DAILY. 180 tablet 1  . ONETOUCH VERIO test strip USE AS INSTRUCTED 100 each 12  . sertraline (ZOLOFT) 50 MG tablet Take 50 mg by mouth daily. Take 1/2 tablet daily for 7 days, then increase to 1 tablet daily.    Marland Kitchen UNIFINE PENTIPS 31G X 8 MM MISC USE 4 TIMES DAILY 100 each 5   No current facility-administered medications on file prior to visit.    BP 130/70 mmHg  Pulse 68  Temp(Src) 99.5 F (37.5 C) (Oral)  Wt 205 lb (92.987 kg)    Objective:   Physical Exam  Constitutional: She is oriented to person, place, and time. She appears well-developed and well-nourished. No distress.  HENT:  Head: Normocephalic and atraumatic.  Neck:  No carotid bruit  Cardiovascular: Normal rate, regular rhythm and normal heart sounds.   No murmur heard. Pulmonary/Chest: Effort normal and breath sounds normal. She has no wheezes.  Musculoskeletal: She exhibits no edema.  Neurological: She is alert and  oriented to person, place, and time. No cranial nerve deficit.  Skin: Skin is dry.  Psychiatric: She has a normal mood and affect. Her behavior is normal.          Assessment & Plan:

## 2014-11-29 NOTE — Assessment & Plan Note (Signed)
BP with manual cuff-140/70. Patient started on Wellbutrin SR since previous visit. Patient advised to monitor blood pressure at home. Goal blood pressure less than 130/80.  BP: 130/70 mmHg

## 2014-11-30 LAB — BASIC METABOLIC PANEL
BUN: 17 mg/dL (ref 6–23)
CALCIUM: 9.3 mg/dL (ref 8.4–10.5)
CO2: 27 mEq/L (ref 19–32)
Chloride: 102 mEq/L (ref 96–112)
Creatinine, Ser: 0.9 mg/dL (ref 0.40–1.20)
GFR: 67.06 mL/min (ref >60.00)
GLUCOSE: 144 mg/dL — AB (ref 70–99)
POTASSIUM: 4.4 meq/L (ref 3.5–5.1)
Sodium: 136 mEq/L (ref 135–145)

## 2014-11-30 LAB — MICROALBUMIN / CREATININE URINE RATIO
CREATININE, U: 37.3 mg/dL
Microalb Creat Ratio: 1.9 mg/g (ref 0.0–30.0)

## 2014-11-30 LAB — HEMOGLOBIN A1C: HEMOGLOBIN A1C: 8.8 % — AB (ref 4.6–6.5)

## 2014-12-08 ENCOUNTER — Encounter: Payer: Self-pay | Admitting: Internal Medicine

## 2014-12-08 ENCOUNTER — Ambulatory Visit (INDEPENDENT_AMBULATORY_CARE_PROVIDER_SITE_OTHER): Payer: 59 | Admitting: Internal Medicine

## 2014-12-08 VITALS — BP 132/72 | HR 78 | Temp 99.0°F | Resp 18 | Wt 205.0 lb

## 2014-12-08 DIAGNOSIS — J309 Allergic rhinitis, unspecified: Secondary | ICD-10-CM | POA: Diagnosis not present

## 2014-12-08 DIAGNOSIS — E1165 Type 2 diabetes mellitus with hyperglycemia: Secondary | ICD-10-CM

## 2014-12-08 DIAGNOSIS — IMO0002 Reserved for concepts with insufficient information to code with codable children: Secondary | ICD-10-CM

## 2014-12-08 DIAGNOSIS — J019 Acute sinusitis, unspecified: Secondary | ICD-10-CM | POA: Diagnosis not present

## 2014-12-08 MED ORDER — LEVOFLOXACIN 250 MG PO TABS: 250.0000 mg | ORAL_TABLET | Freq: Every day | ORAL | Status: DC

## 2014-12-08 MED ORDER — PREDNISONE 10 MG PO TABS: ORAL_TABLET | ORAL | Status: DC

## 2014-12-08 MED ORDER — HYDROCODONE-HOMATROPINE 5-1.5 MG/5ML PO SYRP: 5.0000 mL | ORAL_SOLUTION | Freq: Four times a day (QID) | ORAL | Status: DC | PRN

## 2014-12-08 MED ORDER — METHYLPREDNISOLONE ACETATE 80 MG/ML IJ SUSP
80.0000 mg | Freq: Once | INTRAMUSCULAR | Status: AC
  Administered 2014-12-08: 80 mg via INTRAMUSCULAR

## 2014-12-08 NOTE — Assessment & Plan Note (Signed)
To follow cbg's closely, call for > 200 or onset polys

## 2014-12-08 NOTE — Progress Notes (Signed)
Subjective:    Patient ID: Cassandra Morris, female    DOB: [birth year], 64 y.o.   MRN: [id number]  HPI   Here with 2-3 days acute onset fever, facial pain, pressure, headache, general weakness and malaise, and greenish d/c, with mild ST but quite severe incessant cough, but pt denies chest pain, wheezing, increased sob or doe, orthopnea, PND, increased LE swelling, palpitations, dizziness or syncope.  Does have several wks ongoing nasal allergy symptoms with clearish congestion, itch and sneezing, without fever, pain, ST, cough, swelling or wheezing.   Pt denies polydipsia, polyuria  Past Medical History  Diagnosis Date  . Depression   . Diabetes mellitus     T2DM  . Hyperlipidemia   . Obesity (BMI 30-39.9)    No past surgical history on file.  reports that she quit smoking about 4 years ago. She has never used smokeless tobacco. She reports that she drinks alcohol. She reports that she does not use illicit drugs. family history includes Diabetes in her mother. No Known Allergies Current Outpatient Prescriptions on File Prior to Visit  Medication Sig Dispense Refill  . ALPRAZolam (XANAX) 0.25 MG tablet Take 0.25 mg by mouth daily.    Marland Kitchen atorvastatin (LIPITOR) 20 MG tablet Take 1 tablet (20 mg total) by mouth daily. 90 tablet 1  . buPROPion (WELLBUTRIN SR) 150 MG 12 hr tablet Take 150 mg by mouth daily.    . Cyanocobalamin (VITAMIN B-12) 2500 MCG SUBL Place 2 each under the tongue daily.      . Insulin Glargine (LANTUS SOLOSTAR) 100 UNIT/ML Solostar Pen Inject 40 Units into the skin daily at 10 pm. 30 mL 2  . insulin lispro (HUMALOG KWIKPEN) 100 UNIT/ML KiwkPen Inject 10-15 units 3 times daily 15 minutes before meals 15 mL 3  . losartan (COZAAR) 50 MG tablet Take 1 tablet (50 mg total) by mouth daily. 90 tablet 1  . metFORMIN (GLUCOPHAGE-XR) 500 MG 24 hr tablet TAKE 1 TABLET BY MOUTH 2 TIMES DAILY. 180 tablet 1  . ONETOUCH VERIO test strip USE AS INSTRUCTED 100 each 12  . sertraline  (ZOLOFT) 50 MG tablet Take 50 mg by mouth daily. Take 1/2 tablet daily for 7 days, then increase to 1 tablet daily.    Marland Kitchen UNIFINE PENTIPS 31G X 8 MM MISC USE 4 TIMES DAILY 100 each 5   No current facility-administered medications on file prior to visit.   Review of Systems  Constitutional: Negative for unusual diaphoresis or night sweats HENT: Negative for ringing in ear or discharge Eyes: Negative for double vision or worsening visual disturbance.  Respiratory: Negative for choking and stridor.   Gastrointestinal: Negative for vomiting or other signifcant bowel change Genitourinary: Negative for hematuria or change in urine volume.  Musculoskeletal: Negative for other MSK pain or swelling Skin: Negative for color change and worsening wound.  Neurological: Negative for tremors and numbness other than noted  Psychiatric/Behavioral: Negative for decreased concentration or agitation other than above       Objective:   Physical Exam BP 132/72 mmHg  Pulse 78  Temp(Src) 99 F (37.2 C) (Oral)  Resp 18  Wt 205 lb 0.6 oz (93.006 kg)  SpO2 97% VS noted, mild ill Constitutional: Pt appears in no significant distress HENT: Head: NCAT.  Right Ear: External ear normal. Right TM clear Left Ear: External ear normal. :Left TM marked eyrthema Bilat tm's with mild erythema.  Max sinus areas mild tender left > right with left maxi sinus area  swelling.  Pharynx with mild erythema, no exudate Eyes: . Pupils are equal, round, and reactive to light. Conjunctivae and EOM are normal Neck: Normal range of motion. Neck supple.  Cardiovascular: Normal rate and regular rhythm.   Pulmonary/Chest: Effort normal and breath sounds without rales or wheezing.  Neurological: Pt is alert. Not confused , motor grossly intact Skin: Skin is warm. No rash, no LE edema Psychiatric: Pt behavior is normal. No agitation.   Assessment & Plan:

## 2014-12-08 NOTE — Progress Notes (Signed)
Pre visit review using our clinic review tool, if applicable. No additional management support is needed unless otherwise documented below in the visit note. 

## 2014-12-08 NOTE — Assessment & Plan Note (Signed)
Mild to mod, for antibx course,  to f/u any worsening symptoms or concerns 

## 2014-12-08 NOTE — Assessment & Plan Note (Signed)
Mild to mod seasonal flare - for depomedrol IM, predpac asd,  to f/u any worsening symptoms or concerns

## 2014-12-08 NOTE — Patient Instructions (Signed)
You had the steroid shot today  Please take all new medication as prescribed - the antibiotic, cough medicine, and low dose prednisone  Please continue all other medications as before, and refills have been done if requested.  Please have the pharmacy call with any other refills you may need.  Please continue your efforts at being more active, low cholesterol diabetic diet, and weight control.  Please keep your appointments with your specialists as you may have planned

## 2014-12-24 ENCOUNTER — Other Ambulatory Visit: Payer: Self-pay | Admitting: Internal Medicine

## 2014-12-29 ENCOUNTER — Telehealth: Payer: Self-pay | Admitting: Internal Medicine

## 2014-12-29 NOTE — Telephone Encounter (Signed)
I suggest she transfer to another provider at [facility].  However, if she wants to x fer to Dr. Jenny Reichmann - OK with me.

## 2014-12-29 NOTE — Telephone Encounter (Signed)
Requesting to transfer from [place] to [place].  Please advise.

## 2014-12-30 NOTE — Telephone Encounter (Signed)
Left message for pt to call back  °

## 2014-12-31 NOTE — Telephone Encounter (Signed)
Pt stated she did not want to stay at [facility] she wanted to switch to Dr Jenny Reichmann

## 2015-01-03 NOTE — Telephone Encounter (Signed)
done

## 2015-01-06 ENCOUNTER — Encounter: Payer: Self-pay | Admitting: *Deleted

## 2015-01-06 ENCOUNTER — Other Ambulatory Visit: Payer: Self-pay | Admitting: *Deleted

## 2015-01-07 NOTE — Patient Outreach (Signed)
[facility]) Care Management   01/06/15  Cassandra Morris 01-07-1951 [id number]  Cassandra Morris is an 64 y.o. female who presents for routine Link To Wellness follow up for Type II DM self management assistance.  Subjective:  [name] says she has been sick with a cough, low grade fever and chronic fatigue for several weeks. She last saw Dr. Jenny Reichmann on 4/13 and was prescribed an antibiotic, steroid burst and received a DepoMedrol IM injection. She says she feels better but still has a persistent dry cough and low grade fever and fatigue. Says her glucose control is not good because of the steroids and she is not exercising or following a CHO controlled meal plan consistently. She also says she went through a rough period related to psychotropic medications that were prescribed for her. Says she finally sough the help of another counselor, Lance Bosch psychiatric nurse practitioner,  and is doing much better and was told she does not have bipolar disorder as she was told by her former psychiatrist. Says she has changed primary care provider to Dr. Jenny Reichmann and will see him in follow up on 5/17 and 7/6.  Objective:   Review of Systems  Constitutional: Negative.   Respiratory: Positive for cough.     Physical Exam  Constitutional: She is oriented to person, place, and time. She appears well-developed and well-nourished.  Neurological: She is alert and oriented to person, place, and time.  Skin: Skin is warm and dry.  Psychiatric: She has a normal mood and affect. Her behavior is normal. Judgment and thought content normal.   Filed Weights   01/06/15 1558  Weight: 203 lb 12.8 oz (92.443 kg)   Filed Vitals:   01/06/15 1558  BP: 120/60    Current Medications:   Current Outpatient Prescriptions  Medication Sig Dispense Refill  . ALPRAZolam (XANAX) 0.25 MG tablet Take 0.25 mg by mouth daily.    Marland Kitchen atorvastatin (LIPITOR) 20 MG tablet TAKE 1 TABLET BY MOUTH DAILY. 90 tablet 5   . buPROPion (WELLBUTRIN SR) 150 MG 12 hr tablet Take 150 mg by mouth daily.    . Cyanocobalamin (VITAMIN B-12) 2500 MCG SUBL Place 2 each under the tongue daily.      Marland Kitchen HYDROcodone-homatropine (HYCODAN) 5-1.5 MG/5ML syrup Take 5 mLs by mouth every 6 (six) hours as needed for cough. 180 mL 0  . Insulin Glargine (LANTUS SOLOSTAR) 100 UNIT/ML Solostar Pen Inject 40 Units into the skin daily at 10 pm. 30 mL 2  . insulin lispro (HUMALOG KWIKPEN) 100 UNIT/ML KiwkPen Inject 10-15 units 3 times daily 15 minutes before meals 15 mL 3  . losartan (COZAAR) 50 MG tablet TAKE 1 TABLET BY MOUTH DAILY. 90 tablet 5  . sertraline (ZOLOFT) 50 MG tablet Take 50 mg by mouth daily. Take 1/2 tablet daily for 7 days, then increase to 1 tablet daily.    Marland Kitchen UNIFINE PENTIPS 31G X 8 MM MISC USE 4 TIMES DAILY 100 each 5  . levofloxacin (LEVAQUIN) 250 MG tablet Take 1 tablet (250 mg total) by mouth daily. (Patient not taking: Reported on 01/06/2015) 10 tablet 0  . metFORMIN (GLUCOPHAGE-XR) 500 MG 24 hr tablet TAKE 1 TABLET BY MOUTH 2 TIMES DAILY. (Patient not taking: Reported on 01/06/2015) 180 tablet 1  . ONETOUCH VERIO test strip USE AS INSTRUCTED 100 each 12  . predniSONE (DELTASONE) 10 MG tablet 3 tabs by mouth per day for 3 days,2tabs per day for 3 days,1tab per day for 3  days (Patient not taking: Reported on 01/06/2015) 18 tablet 0   No current facility-administered medications for this visit.    Functional Status:   In your present state of health, do you have any difficulty performing the following activities: 01/06/2015  Hearing? N  Vision? N  Walking or climbing stairs? N  Doing errands, shopping? N    Fall/Depression Screening:    PHQ 2/9 Scores 01/06/2015  PHQ - 2 Score 1   [facility] CM Care Plan Problem One        Patient Outreach from 01/06/2015 in [place] Problem One  Type II DM (IDDM) not meeting a1ctarget as evidenced by A1C= 8.8% on 11/29/14   Care Plan for Problem One  Active    [facility] Long Term Goal (31-90 days)  Improved glycemic control as evidenced by A1C <8.5% at next check   [facility] Long Term Goal Start Date  01/06/15   Interventions for Problem One Long Term Goal  reviewed the 8 core pathophysiologic deficits in Type II diabetes. discussed physiology of diabetes as a chronic progressive disease with the initial problem of insulin resistance in the muscle, liver and fat cells and then increased loss of beta cell function over time resulting in decreased insulin production, discussed role of obesity, especially central obesity, on insulin resistance, reviewed patient medications, discussed DM medications of Lantus, Humalog and Metformin including the mechanism of action, common side effects, dosages and dosing schedule,onset and duration of action, reinforced importance of taking all medications as prescribed, discussed the need for the use of a combination of DM medications to correct the pathophysiologic core deficits and to  prevent or slow beta cell failure, discussed effect of steroids and illness on blood sugar control, encouraged Cassandra Morris to make follow up appointment with Dr. Jenny Reichmann since her symptoms persist, discussed role of statins and blood pressure medicines in the treatment of diabetes, reveiwed the three primary macronutrients (CHO, protein, fat) and their effect on glucose levels,  will arrange for Link To Wellness follow up after she sees Dr. Jenny Reichmann on 03/02/15     Assessment:   Link To Wellness member with Type II DM not meeting A1C target.  Plan:  Fax today's note to Dr. Jenny Reichmann. RNCM will meet quarterly and as needed with patient per Link To Wellness program guidelines to assist with Type II DM self-management and assess patient's progress toward mutually set goals.   Barrington Ellison RN,CCM,CDE [place] Management Coordinator Link To Wellness Office Phone [phone] Office Fax [phone]

## 2015-01-11 ENCOUNTER — Encounter: Payer: Self-pay | Admitting: Internal Medicine

## 2015-01-11 ENCOUNTER — Ambulatory Visit (INDEPENDENT_AMBULATORY_CARE_PROVIDER_SITE_OTHER): Payer: 59 | Admitting: Internal Medicine

## 2015-01-11 VITALS — BP 138/80 | HR 88 | Temp 98.6°F | Wt 201.0 lb

## 2015-01-11 DIAGNOSIS — J069 Acute upper respiratory infection, unspecified: Secondary | ICD-10-CM | POA: Diagnosis not present

## 2015-01-11 DIAGNOSIS — I10 Essential (primary) hypertension: Secondary | ICD-10-CM | POA: Diagnosis not present

## 2015-01-11 DIAGNOSIS — E785 Hyperlipidemia, unspecified: Secondary | ICD-10-CM | POA: Diagnosis not present

## 2015-01-11 DIAGNOSIS — E1165 Type 2 diabetes mellitus with hyperglycemia: Secondary | ICD-10-CM

## 2015-01-11 DIAGNOSIS — IMO0002 Reserved for concepts with insufficient information to code with codable children: Secondary | ICD-10-CM

## 2015-01-11 MED ORDER — HYDROCODONE-HOMATROPINE 5-1.5 MG/5ML PO SYRP: 5.0000 mL | ORAL_SOLUTION | Freq: Four times a day (QID) | ORAL | Status: DC | PRN

## 2015-01-11 MED ORDER — CEPHALEXIN 500 MG PO CAPS: 500.0000 mg | ORAL_CAPSULE | Freq: Four times a day (QID) | ORAL | Status: DC

## 2015-01-11 NOTE — Progress Notes (Signed)
Pre visit review using our clinic review tool, if applicable. No additional management support is needed unless otherwise documented below in the visit note. 

## 2015-01-11 NOTE — Patient Instructions (Signed)
.  Please take all new medication as prescribed - the antibiotic, and cough medicine ° °Please continue all other medications as before, and refills have been done if requested. ° °Please have the pharmacy call with any other refills you may need. ° °Please continue your efforts at being more active, low cholesterol diet, and weight control. ° °Please keep your appointments with your specialists as you may have planned ° ° ° °

## 2015-01-11 NOTE — Progress Notes (Signed)
Subjective:    Patient ID: Cassandra Morris, female    DOB: 06-15-1951, 64 y.o.   MRN: [id number]  HPI   Here with 2-3 days acute onset fever, facial pain, pressure, headache, general weakness and malaise, and greenish d/c, with mild ST and cough, but pt denies chest pain, wheezing, increased sob or doe, orthopnea, PND, increased LE swelling, palpitations, dizziness or syncope. Also Does have several wks ongoing nasal allergy symptoms with clearish congestion, itch and sneezing, without fever, pain, ST, cough, swelling or wheezing.  Pt denies polydipsia, polyuria,Pt denies new neurological symptoms such as new headache, or facial or extremity weakness or numbness Past Medical History  Diagnosis Date  . Depression   . Diabetes mellitus     T2DM  . Hyperlipidemia   . Obesity (BMI 30-39.9)    No past surgical history on file.  reports that she quit smoking about 4 years ago. She has never used smokeless tobacco. She reports that she drinks alcohol. She reports that she does not use illicit drugs. family history includes Diabetes in her mother. No Known Allergies  Current Outpatient Prescriptions on File Prior to Visit  Medication Sig Dispense Refill  . ALPRAZolam (XANAX) 0.25 MG tablet Take 0.25 mg by mouth daily.    Marland Kitchen atorvastatin (LIPITOR) 20 MG tablet TAKE 1 TABLET BY MOUTH DAILY. 90 tablet 5  . buPROPion (WELLBUTRIN SR) 150 MG 12 hr tablet Take 150 mg by mouth daily.    . Cyanocobalamin (VITAMIN B-12) 2500 MCG SUBL Place 2 each under the tongue daily.      . Insulin Glargine (LANTUS SOLOSTAR) 100 UNIT/ML Solostar Pen Inject 40 Units into the skin daily at 10 pm. 30 mL 2  . insulin lispro (HUMALOG KWIKPEN) 100 UNIT/ML KiwkPen Inject 10-15 units 3 times daily 15 minutes before meals 15 mL 3  . losartan (COZAAR) 50 MG tablet TAKE 1 TABLET BY MOUTH DAILY. 90 tablet 5  . metFORMIN (GLUCOPHAGE-XR) 500 MG 24 hr tablet TAKE 1 TABLET BY MOUTH 2 TIMES DAILY. (Patient not taking: Reported on  01/06/2015) 180 tablet 1  . ONETOUCH VERIO test strip USE AS INSTRUCTED 100 each 12  . sertraline (ZOLOFT) 50 MG tablet Take 50 mg by mouth daily. Take 1/2 tablet daily for 7 days, then increase to 1 tablet daily.    Marland Kitchen UNIFINE PENTIPS 31G X 8 MM MISC USE 4 TIMES DAILY 100 each 5   No current facility-administered medications on file prior to visit.   Review of Systems  Constitutional: Negative for unusual diaphoresis or night sweats HENT: Negative for ringing in ear or discharge Eyes: Negative for double vision or worsening visual disturbance.  Respiratory: Negative for choking and stridor.   Gastrointestinal: Negative for vomiting or other signifcant bowel change Genitourinary: Negative for hematuria or change in urine volume.  Musculoskeletal: Negative for other MSK pain or swelling Skin: Negative for color change and worsening wound.  Neurological: Negative for tremors and numbness other than noted  Psychiatric/Behavioral: Negative for decreased concentration or agitation other than above       Objective:   Physical Exam BP 138/80 mmHg  Pulse 88  Temp(Src) 98.6 F (37 C) (Oral)  Wt 201 lb (91.173 kg)  SpO2 95% VS noted, mild ill Constitutional: Pt appears in no significant distress HENT: Head: NCAT.  Right Ear: External ear normal.  Left Ear: External ear normal.  Eyes: . Pupils are equal, round, and reactive to light. Conjunctivae and EOM are normal Bilat tm's with  mild erythema.  Max sinus areas mild tender.  Pharynx with mild erythema, no exudate Neck: Normal range of motion. Neck supple. with bilat submandib lymphadenopathy Cardiovascular: Normal rate and regular rhythm.   Pulmonary/Chest: Effort normal and breath sounds without rales or wheezing.  Neurological: Pt is alert. Not confused , motor grossly intact Skin: Skin is warm. No rash, no LE edema Psychiatric: Pt behavior is normal. No agitation.         Assessment & Plan:

## 2015-01-12 NOTE — Assessment & Plan Note (Signed)
stable overall by history and exam, recent data reviewed with pt, and pt to continue medical treatment as before,  to f/u any worsening symptoms or concerns Lab Results  Component Value Date   CHOL 123 07/16/2014   HDL 47.70 07/16/2014   LDLCALC 61 07/16/2014   LDLDIRECT 166.8 04/20/2014   TRIG 71.0 07/16/2014   CHOLHDL 3 07/16/2014   Lab Results  Component Value Date   LDLCALC 61 07/16/2014

## 2015-01-12 NOTE — Assessment & Plan Note (Signed)
stable overall by history and exam, recent data reviewed with pt, and pt to continue medical treatment as before,  to f/u any worsening symptoms or concerns Lab Results  Component Value Date   HGBA1C 8.8* 11/29/2014   Declines fu lab today

## 2015-01-12 NOTE — Assessment & Plan Note (Signed)
Mild to mod, for antibx course,  to f/u any worsening symptoms or concerns 

## 2015-01-12 NOTE — Assessment & Plan Note (Signed)
stable overall by history and exam, recent data reviewed with pt, and pt to continue medical treatment as before,  to f/u any worsening symptoms or concerns BP Readings from Last 3 Encounters:  01/11/15 138/80  01/06/15 120/60  12/08/14 132/72

## 2015-02-24 ENCOUNTER — Other Ambulatory Visit (INDEPENDENT_AMBULATORY_CARE_PROVIDER_SITE_OTHER): Payer: 59

## 2015-02-24 DIAGNOSIS — IMO0002 Reserved for concepts with insufficient information to code with codable children: Secondary | ICD-10-CM

## 2015-02-24 DIAGNOSIS — E1165 Type 2 diabetes mellitus with hyperglycemia: Secondary | ICD-10-CM | POA: Diagnosis not present

## 2015-02-24 DIAGNOSIS — E785 Hyperlipidemia, unspecified: Secondary | ICD-10-CM

## 2015-02-24 LAB — URINALYSIS, ROUTINE W REFLEX MICROSCOPIC
Bilirubin Urine: NEGATIVE
Hgb urine dipstick: NEGATIVE
Ketones, ur: NEGATIVE
Nitrite: NEGATIVE
Specific Gravity, Urine: 1.01 (ref 1.000–1.030)
Total Protein, Urine: NEGATIVE
Urine Glucose: NEGATIVE
Urobilinogen, UA: 0.2 (ref 0.0–1.0)
pH: 6.5 (ref 5.0–8.0)

## 2015-02-24 LAB — HEPATIC FUNCTION PANEL
ALK PHOS: 87 U/L (ref 39–117)
ALT: 14 U/L (ref 0–35)
AST: 13 U/L (ref 0–37)
Albumin: 3.9 g/dL (ref 3.5–5.2)
Bilirubin, Direct: 0 mg/dL (ref 0.0–0.3)
TOTAL PROTEIN: 6.9 g/dL (ref 6.0–8.3)
Total Bilirubin: 0.3 mg/dL (ref 0.2–1.2)

## 2015-02-24 LAB — CBC WITH DIFFERENTIAL/PLATELET
Basophils Absolute: 0 10*3/uL (ref 0.0–0.1)
Basophils Relative: 0.4 % (ref 0.0–3.0)
EOS ABS: 0.3 10*3/uL (ref 0.0–0.7)
Eosinophils Relative: 2.4 % (ref 0.0–5.0)
HCT: 40.5 % (ref 36.0–46.0)
HEMOGLOBIN: 13.5 g/dL (ref 12.0–15.0)
Lymphocytes Relative: 38.9 % (ref 12.0–46.0)
Lymphs Abs: 4.5 10*3/uL — ABNORMAL HIGH (ref 0.7–4.0)
MCHC: 33.4 g/dL (ref 30.0–36.0)
MCV: 84.3 fl (ref 78.0–100.0)
MONOS PCT: 6.8 % (ref 3.0–12.0)
Monocytes Absolute: 0.8 10*3/uL (ref 0.1–1.0)
NEUTROS ABS: 6 10*3/uL (ref 1.4–7.7)
NEUTROS PCT: 51.5 % (ref 43.0–77.0)
Platelets: 238 10*3/uL (ref 150.0–400.0)
RBC: 4.8 Mil/uL (ref 3.87–5.11)
RDW: 13.7 % (ref 11.5–15.5)
WBC: 11.6 10*3/uL — ABNORMAL HIGH (ref 4.0–10.5)

## 2015-02-24 LAB — LDL CHOLESTEROL, DIRECT: LDL DIRECT: 177 mg/dL

## 2015-02-24 LAB — MICROALBUMIN / CREATININE URINE RATIO
CREATININE, U: 52.8 mg/dL
Microalb Creat Ratio: 1.3 mg/g (ref 0.0–30.0)
Microalb, Ur: <0.7 mg/dL (ref 0.0–1.9)

## 2015-02-24 LAB — LIPID PANEL
Cholesterol: 254 mg/dL — ABNORMAL HIGH (ref 0–200)
HDL: 50.4 mg/dL (ref >39.00)
NonHDL: 203.6
TRIGLYCERIDES: 237 mg/dL — AB (ref 0.0–149.0)
Total CHOL/HDL Ratio: 5
VLDL: 47.4 mg/dL — ABNORMAL HIGH (ref 0.0–40.0)

## 2015-02-24 LAB — TSH: TSH: 2.57 u[IU]/mL (ref 0.35–4.50)

## 2015-02-24 LAB — HEMOGLOBIN A1C: HEMOGLOBIN A1C: 8.8 % — AB (ref 4.6–6.5)

## 2015-02-24 LAB — BASIC METABOLIC PANEL
BUN: 16 mg/dL (ref 6–23)
CO2: 30 mEq/L (ref 19–32)
Calcium: 9.1 mg/dL (ref 8.4–10.5)
Chloride: 101 mEq/L (ref 96–112)
Creatinine, Ser: 0.84 mg/dL (ref 0.40–1.20)
GFR: 72.57 mL/min (ref >60.00)
GLUCOSE: 92 mg/dL (ref 70–99)
Potassium: 4.2 mEq/L (ref 3.5–5.1)
Sodium: 138 mEq/L (ref 135–145)

## 2015-03-02 ENCOUNTER — Ambulatory Visit: Payer: 59 | Admitting: Internal Medicine

## 2015-03-02 ENCOUNTER — Ambulatory Visit (INDEPENDENT_AMBULATORY_CARE_PROVIDER_SITE_OTHER)
Admission: RE | Admit: 2015-03-02 | Discharge: 2015-03-02 | Disposition: A | Discharge status: Home / Self Care | Payer: 59 | Source: Ambulatory Visit | Attending: Internal Medicine | Admitting: Internal Medicine

## 2015-03-02 ENCOUNTER — Encounter: Payer: Self-pay | Admitting: Internal Medicine

## 2015-03-02 ENCOUNTER — Ambulatory Visit (INDEPENDENT_AMBULATORY_CARE_PROVIDER_SITE_OTHER): Payer: 59 | Admitting: Internal Medicine

## 2015-03-02 DIAGNOSIS — E785 Hyperlipidemia, unspecified: Secondary | ICD-10-CM

## 2015-03-02 DIAGNOSIS — J3489 Other specified disorders of nose and nasal sinuses: Secondary | ICD-10-CM

## 2015-03-02 DIAGNOSIS — E1165 Type 2 diabetes mellitus with hyperglycemia: Secondary | ICD-10-CM | POA: Diagnosis not present

## 2015-03-02 DIAGNOSIS — R05 Cough: Secondary | ICD-10-CM | POA: Diagnosis not present

## 2015-03-02 DIAGNOSIS — Z0000 Encounter for general adult medical examination without abnormal findings: Secondary | ICD-10-CM

## 2015-03-02 DIAGNOSIS — IMO0002 Reserved for concepts with insufficient information to code with codable children: Secondary | ICD-10-CM

## 2015-03-02 DIAGNOSIS — Z23 Encounter for immunization: Secondary | ICD-10-CM

## 2015-03-02 DIAGNOSIS — I10 Essential (primary) hypertension: Secondary | ICD-10-CM

## 2015-03-02 DIAGNOSIS — R059 Cough, unspecified: Secondary | ICD-10-CM

## 2015-03-02 MED ORDER — PNEUMOCOCCAL 13-VAL CONJ VACC IM SUSP
0.5000 mL | Freq: Once | INTRAMUSCULAR | Status: AC
  Administered 2015-03-02: 0.5 mL via INTRAMUSCULAR

## 2015-03-02 MED ORDER — HYDROCODONE-HOMATROPINE 5-1.5 MG/5ML PO SYRP: 5.0000 mL | ORAL_SOLUTION | Freq: Four times a day (QID) | ORAL | Status: DC | PRN

## 2015-03-02 MED ORDER — MUPIROCIN CALCIUM 2 % NA OINT: 1.0000 "application " | TOPICAL_OINTMENT | Freq: Two times a day (BID) | NASAL | Status: DC

## 2015-03-02 MED ORDER — ATORVASTATIN CALCIUM 20 MG PO TABS: 20.0000 mg | ORAL_TABLET | Freq: Every day | ORAL | Status: DC

## 2015-03-02 MED ORDER — PNEUMOCOCCAL 13-VAL CONJ VACC IM SUSP: 0.5000 mL | Freq: Once | INTRAMUSCULAR | Status: DC

## 2015-03-02 NOTE — Assessment & Plan Note (Signed)

## 2015-03-02 NOTE — Progress Notes (Signed)
Pre visit review using our clinic review tool, if applicable. No additional management support is needed unless otherwise documented below in the visit note. 

## 2015-03-02 NOTE — Assessment & Plan Note (Signed)
Now chronic persistent, not improved with tx including steroid, for f/u cxr today, refer pulm

## 2015-03-02 NOTE — Assessment & Plan Note (Signed)
Uncontrolled due to noncompliance, states will do better, o/w stable overall by history and exam, recent data reviewed with pt, and pt to continue medical treatment as before,  to f/u any worsening symptoms or concerns Lab Results  Component Value Date   HGBA1C 8.8* 02/24/2015

## 2015-03-02 NOTE — Addendum Note (Signed)
Addended by: Biagio Borg on: 03/02/2015 05:14 PM   Modules accepted: Orders

## 2015-03-02 NOTE — Assessment & Plan Note (Signed)
For mupirocin asd,  to f/u any worsening symptoms or concerns

## 2015-03-02 NOTE — Progress Notes (Signed)
Subjective:    Patient ID: Cassandra Morris, female    DOB: 1951-08-10, 64 y.o.   MRN: [id number]  HPI      Here for wellness and f/u;  Overall doing ok;  Pt denies Chest pain, worsening SOB, DOE, wheezing, orthopnea, PND, worsening LE edema, palpitations, dizziness or syncope.  Pt denies neurological change such as new headache, facial or extremity weakness.  Pt denies polydipsia, polyuria, or low sugar symptoms. Pt states overall good compliance with treatment and medications, good tolerability, and has been trying to follow appropriate diet.  Pt denies worsening depressive symptoms, suicidal ideation or panic. No fever, night sweats, wt loss, loss of appetite, or other constitutional symptoms.  Pt states good ability with ADL's, has low fall risk, home safety reviewed and adequate, no other significant changes in hearing or vision, and only occasionally active with exercise.  Has had colonoscpy , detals not clear, she thinksk 2-3 yrs ago.  Admits to Dm med noncompliacne, states will do better  Cough still persists despite recent tx with antibx and steroid tx.   Does currently have a sore to the right nose/works in hospital in facilities. Has ongoing allergies, especially to bradford pears. Has occas heartburn, but no dysphagia.  Cough worse to be outside, standing and walking.  Cough  Better to lie down. OTC meds for allergies no help as well. Tylenol sinus has helped with sinus symtpoms, also tried mucinex DM but little help if at all.  No longer using nettie pott.  Pt denies fever, wt loss, night sweats, loss of appetite, or other constitutional symptoms.  Cough has led to dizziness as well. Past Medical History  Diagnosis Date  . Depression   . Diabetes mellitus     T2DM  . Hyperlipidemia   . Obesity (BMI 30-39.9)    No past surgical history on file.  reports that she quit smoking about 4 years ago. She has never used smokeless tobacco. She reports that she drinks alcohol. She reports that she  does not use illicit drugs. family history includes Diabetes in her mother. No Known Allergies Current Outpatient Prescriptions on File Prior to Visit  Medication Sig Dispense Refill  . ALPRAZolam (XANAX) 0.25 MG tablet Take 0.25 mg by mouth daily.    Marland Kitchen atorvastatin (LIPITOR) 20 MG tablet TAKE 1 TABLET BY MOUTH DAILY. 90 tablet 5  . buPROPion (WELLBUTRIN SR) 150 MG 12 hr tablet Take 150 mg by mouth daily.    . Cyanocobalamin (VITAMIN B-12) 2500 MCG SUBL Place 2 each under the tongue daily.      . Insulin Glargine (LANTUS SOLOSTAR) 100 UNIT/ML Solostar Pen Inject 40 Units into the skin daily at 10 pm. 30 mL 2  . insulin lispro (HUMALOG KWIKPEN) 100 UNIT/ML KiwkPen Inject 10-15 units 3 times daily 15 minutes before meals 15 mL 3  . losartan (COZAAR) 50 MG tablet TAKE 1 TABLET BY MOUTH DAILY. 90 tablet 5  . metFORMIN (GLUCOPHAGE-XR) 500 MG 24 hr tablet TAKE 1 TABLET BY MOUTH 2 TIMES DAILY. 180 tablet 1  . ONETOUCH VERIO test strip USE AS INSTRUCTED 100 each 12  . sertraline (ZOLOFT) 50 MG tablet Take 50 mg by mouth daily. Take 1/2 tablet daily for 7 days, then increase to 1 tablet daily.    Marland Kitchen UNIFINE PENTIPS 31G X 8 MM MISC USE 4 TIMES DAILY 100 each 5  . cephALEXin (KEFLEX) 500 MG capsule Take 1 capsule (500 mg total) by mouth 4 (four) times daily. (Patient  not taking: Reported on 03/02/2015) 40 capsule 0  . HYDROcodone-homatropine (HYCODAN) 5-1.5 MG/5ML syrup Take 5 mLs by mouth every 6 (six) hours as needed for cough. (Patient not taking: Reported on 03/02/2015) 180 mL 0   No current facility-administered medications on file prior to visit.    Review of Systems  Constitutional: Negative for increased diaphoresis, other activity, appetite or siginficant weight change other than noted HENT: Negative for worsening hearing loss, ear pain, facial swelling, mouth sores and neck stiffness.   Eyes: Negative for other worsening pain, redness or visual disturbance.  Respiratory: Negative for shortness of  breath and wheezing  Cardiovascular: Negative for chest pain and palpitations.  Gastrointestinal: Negative for diarrhea, blood in stool, abdominal distention or other pain Genitourinary: Negative for hematuria, flank pain or change in urine volume.  Musculoskeletal: Negative for myalgias or other joint complaints.  Skin: Negative for color change and wound or drainage.  Neurological: Negative for syncope and numbness. other than noted Hematological: Negative for adenopathy. or other swelling Psychiatric/Behavioral: Negative for hallucinations, SI, self-injury, decreased concentration or other worsening agitation.      Objective:   Physical Exam BP 136/88 mmHg  Pulse 83  Temp(Src) 98.6 F (37 C) (Oral)  Ht 5\' 8"  (1.727 m)  Wt 206 lb (93.441 kg)  BMI 31.33 kg/m2  SpO2 95% VS noted,  Constitutional: Pt is oriented to person, place, and time. Appears well-developed and well-nourished, in no significant distress Head: Normocephalic and atraumatic.  Right Ear: External ear normal.  Left Ear: External ear normal.  Nose: Nose normal. Except for right nasal sore/red Mouth/Throat: Oropharynx is clear and moist.  Eyes: Conjunctivae and EOM are normal. Pupils are equal, round, and reactive to light.  Neck: Normal range of motion. Neck supple. No JVD present. No tracheal deviation present or significant neck LA or mass Cardiovascular: Normal rate, regular rhythm, normal heart sounds and intact distal pulses.   Pulmonary/Chest: Effort normal and breath sounds without rales or wheezing  Abdominal: Soft. Bowel sounds are normal. NT. No HSM  Musculoskeletal: Normal range of motion. Exhibits no edema.  Lymphadenopathy:  Has no cervical adenopathy.  Neurological: Pt is alert and oriented to person, place, and time. Pt has normal reflexes. No cranial nerve deficit. Motor grossly intact, + tremor Skin: Skin is warm and dry. No rash noted.  Psychiatric:  Has normal mood and affect. Behavior is normal.        Assessment & Plan:

## 2015-03-02 NOTE — Addendum Note (Signed)
Addended by: Lyman Bishop on: 03/02/2015 05:19 PM   Modules accepted: Orders

## 2015-03-02 NOTE — Assessment & Plan Note (Signed)
Elevated due to not taking statin, for re-start statin Lab Results  Component Value Date   CHOL 254* 02/24/2015   HDL 50.40 02/24/2015   LDLCALC 61 07/16/2014   LDLDIRECT 177.0 02/24/2015   TRIG 237.0* 02/24/2015   CHOLHDL 5 02/24/2015

## 2015-03-02 NOTE — Assessment & Plan Note (Signed)
stable overall by history and exam, recent data reviewed with pt, and pt to continue medical treatment as before,  to f/u any worsening symptoms or concerns BP Readings from Last 3 Encounters:  03/02/15 136/88  01/11/15 138/80  01/06/15 120/60

## 2015-03-02 NOTE — Patient Instructions (Addendum)
You had the new Prevnar pneumonia shot today  Please take all new medication as prescribed  - the nasal ointment antibiotic for your nose (sent to your pharmacy)  Please continue all other medications as before, and refills have been done for the cough medicine  Please have the pharmacy call with any other refills you may need.  Please continue your efforts at being more active, low cholesterol diet, and weight control.  You are otherwise up to date with prevention measures today.  You will be contacted regarding the referral for: pulmonary for the cough  Please keep your appointments with your specialists as you may have planned  Please go to the XRAY Department in the Basement (go straight as you get off the elevator) for the x-ray testing today  You will be contacted by phone if any changes need to be made immediately.  Otherwise, you will receive a letter about your results with an explanation, but please check with MyChart first.  Please remember to sign up for MyChart if you have not done so, as this will be important to you in the future with finding out test results, communicating by private email, and scheduling acute appointments online when needed.  Please return in 6 months, or sooner if needed, with Lab testing done 3-5 days before

## 2015-03-16 ENCOUNTER — Other Ambulatory Visit (INDEPENDENT_AMBULATORY_CARE_PROVIDER_SITE_OTHER): Payer: 59

## 2015-03-16 ENCOUNTER — Encounter: Payer: Self-pay | Admitting: Internal Medicine

## 2015-03-16 ENCOUNTER — Ambulatory Visit (INDEPENDENT_AMBULATORY_CARE_PROVIDER_SITE_OTHER): Payer: 59 | Admitting: Internal Medicine

## 2015-03-16 VITALS — BP 118/60 | HR 86 | Ht 68.0 in | Wt 206.4 lb

## 2015-03-16 DIAGNOSIS — R05 Cough: Secondary | ICD-10-CM

## 2015-03-16 DIAGNOSIS — R058 Other specified cough: Secondary | ICD-10-CM

## 2015-03-16 LAB — CBC WITH DIFFERENTIAL/PLATELET
BASOS ABS: 0.1 10*3/uL (ref 0.0–0.1)
Basophils Relative: 0.5 % (ref 0.0–3.0)
EOS ABS: 0.3 10*3/uL (ref 0.0–0.7)
EOS PCT: 2.2 % (ref 0.0–5.0)
HCT: 43.1 % (ref 36.0–46.0)
Hemoglobin: 14.4 g/dL (ref 12.0–15.0)
LYMPHS PCT: 50.8 % — AB (ref 12.0–46.0)
Lymphs Abs: 7.1 10*3/uL — ABNORMAL HIGH (ref 0.7–4.0)
MCHC: 33.3 g/dL (ref 30.0–36.0)
MCV: 85.3 fl (ref 78.0–100.0)
MONO ABS: 0.8 10*3/uL (ref 0.1–1.0)
MONOS PCT: 5.6 % (ref 3.0–12.0)
NEUTROS PCT: 40.9 % — AB (ref 43.0–77.0)
Neutro Abs: 5.7 10*3/uL (ref 1.4–7.7)
Platelets: 272 10*3/uL (ref 150.0–400.0)
RBC: 5.05 Mil/uL (ref 3.87–5.11)
RDW: 13.7 % (ref 11.5–15.5)
WBC: 14 10*3/uL — AB (ref 4.0–10.5)

## 2015-03-16 MED ORDER — MONTELUKAST SODIUM 10 MG PO TABS: ORAL_TABLET | ORAL | Status: DC

## 2015-03-16 MED ORDER — PANTOPRAZOLE SODIUM 40 MG PO TBEC: 40.0000 mg | DELAYED_RELEASE_TABLET | Freq: Every day | ORAL | Status: DC

## 2015-03-16 NOTE — Patient Instructions (Addendum)
Please see patient coordinator before you leave today  to schedule sinus CT   Please remember to go to the lab   department downstairs for your tests - we will call you with the results when they are available.  Start singulair 10 mg (montelukast) every pm   Try zyrtec 10 mg each am if it doesn't make you sleepy  Protonix 40 mg Take 30- 60 min before your first and last meals of the day   GERD (REFLUX)  is an extremely common cause of respiratory symptoms just like yours , many times with no obvious heartburn at all.    It can be treated with medication, but also with lifestyle changes including elevation of the head of your bed (ideally with 6 inch  bed blocks),  Smoking cessation, avoidance of late meals, excessive alcohol, and avoid fatty foods, chocolate, peppermint, colas, red wine, and acidic juices such as orange juice.  NO MINT OR MENTHOL PRODUCTS SO NO COUGH DROPS  USE SUGARLESS CANDY INSTEAD (Jolley ranchers or Stover's or Life Savers) or even ice chips will also do - the key is to swallow to prevent all throat clearing. NO OIL BASED VITAMINS - use powdered substitutes.    Please schedule a follow up office visit in 4 weeks, sooner if needed

## 2015-03-16 NOTE — Progress Notes (Signed)
Subjective:    Patient ID: Cassandra Morris, female    DOB: 08-11-1951   MRN: [id number]  HPI  64 yowf quit smoking 1986 no sequelae then around 2011 noted nasal congestion and drainage with the [facility] pears tends to linger until winter until cool dry air hits / [place] with sensaation of pnds / choking and refractory to abx and steroids so referred to pulmonary clinic 03/16/2015 by Dr Marshall Cork    03/16/2015 1st [facility] office visit/ Cassandra Morris   Chief Complaint  Patient presents with  . Pulmonary Consult    Referred by Dr. Cathlean Cower. Pt c/o cough x 2 months- non prod. She states that she always has cough that starts in the Spring.   correction onset x  4 months not two/indolent and persistent daily  cough = dry assoc with throat clearing worse when walk outside in hot humidity or wet weather, better at hs  Worse on advair, barely better with prednisone   No obvious other patterns in day to day or daytime variabilty or assoc sob  or cp or chest tightness, subjective wheeze overt sinus or hb symptoms. No unusual exp hx or h/o childhood pna/ asthma or knowledge of premature birth.  Sleeping ok without nocturnal  or early am exacerbation  of respiratory  c/o's or need for noct saba. Also denies any obvious fluctuation of symptoms with weather or environmental changes or other aggravating or alleviating factors except as outlined above   Current Medications, Allergies, Complete Past Medical History, Past Surgical History, Family History, and Social History were reviewed in [employer] record.               Review of Systems  Constitutional: Negative for fever, chills and unexpected weight change.  HENT: Positive for congestion and ear pain. Negative for dental problem, nosebleeds, postnasal drip, rhinorrhea, sinus pressure, sneezing, sore throat, trouble swallowing and voice change.   Eyes: Negative for visual disturbance.  Respiratory:  Positive for cough. Negative for choking and shortness of breath.   Cardiovascular: Negative for chest pain and leg swelling.  Gastrointestinal: Negative for vomiting, abdominal pain and diarrhea.  Genitourinary: Negative for difficulty urinating.  Musculoskeletal: Negative for arthralgias.  Skin: Negative for rash.  Neurological: Negative for tremors, syncope and headaches.  Hematological: Does not bruise/bleed easily.       Objective:   Physical Exam  amb wf nad  Wt Readings from Last 3 Encounters:  03/16/15 206 lb 6.4 oz (93.622 kg)  03/02/15 206 lb (93.441 kg)  01/11/15 201 lb (91.173 kg)      HEENT: nl dentition, turbinates, and orophanx. Nl external ear canals without cough reflex   NECK :  without JVD/Nodes/TM/ nl carotid upstrokes bilaterally   LUNGS: no acc muscle use, clear to A and P bilaterally without cough on insp   maneuvers   CV:  RRR  no s3 or murmur or increase in P2, no edema   ABD:  soft and nontender with nl excursion in the supine position. No bruits or organomegaly, bowel sounds nl  MS:  warm without deformities, calf tenderness, cyanosis or clubbing  SKIN: warm and dry without lesions    NEURO:  alert, approp, no deficits      I personally reviewed images and agree with radiology impression as follows:  CXR:  03/02/15 The heart size and vascular pattern are normal. Mediastinal and hilar contours stable. No infiltrate or effusion.  Labs ordered allergy  profile/ cbc with diff    Assessment & Plan:

## 2015-03-17 ENCOUNTER — Other Ambulatory Visit: Payer: Self-pay | Admitting: Internal Medicine

## 2015-03-17 LAB — ALLERGY FULL PROFILE
Allergen,Goose feathers, e70: <0.1 kU/L
Alternaria Alternata: <0.1 kU/L
Aspergillus fumigatus, m3: <0.1 kU/L
Bahia Grass: <0.1 kU/L
Box Elder IgE: <0.1 kU/L
Candida Albicans: <0.1 kU/L
Cat Dander: <0.1 kU/L
Curvularia lunata: <0.1 kU/L
Dog Dander: <0.1 kU/L
Goldenrod: <0.1 kU/L
Helminthosporium halodes: <0.1 kU/L
IGE (IMMUNOGLOBULIN E), SERUM: 6 kU/L (ref <115)
Lamb's Quarters: <0.1 kU/L
Oak: <0.1 kU/L
Stemphylium Botryosum: <0.1 kU/L

## 2015-03-18 ENCOUNTER — Encounter: Payer: Self-pay | Admitting: Internal Medicine

## 2015-03-18 ENCOUNTER — Ambulatory Visit (INDEPENDENT_AMBULATORY_CARE_PROVIDER_SITE_OTHER)
Admission: RE | Admit: 2015-03-18 | Discharge: 2015-03-18 | Disposition: A | Discharge status: Home / Self Care | Payer: 59 | Source: Ambulatory Visit | Attending: Internal Medicine | Admitting: Internal Medicine

## 2015-03-18 ENCOUNTER — Other Ambulatory Visit: Payer: Self-pay | Admitting: Internal Medicine

## 2015-03-18 ENCOUNTER — Telehealth: Payer: Self-pay | Admitting: Internal Medicine

## 2015-03-18 DIAGNOSIS — R058 Other specified cough: Secondary | ICD-10-CM

## 2015-03-18 DIAGNOSIS — R05 Cough: Secondary | ICD-10-CM

## 2015-03-18 MED ORDER — PANTOPRAZOLE SODIUM 40 MG PO TBEC: 40.0000 mg | DELAYED_RELEASE_TABLET | Freq: Two times a day (BID) | ORAL | Status: DC

## 2015-03-18 NOTE — Assessment & Plan Note (Signed)
- allergy profile 03/16/2015 >   Eos 0.3,   IgE 6 neg RAST   The most common causes of chronic cough in immunocompetent adults include the following: upper airway cough syndrome (UACS), previously referred to as postnasal drip syndrome (PNDS), which is caused by variety of rhinosinus conditions; (2) asthma; (3) GERD; (4) chronic bronchitis from cigarette smoking or other inhaled environmental irritants; (5) nonasthmatic eosinophilic bronchitis; and (6) bronchiectasis.   These conditions, singly or in combination, have accounted for up to 94% of the causes of chronic cough in prospective studies.   Other conditions have constituted no >6% of the causes in prospective studies These have included bronchogenic carcinoma, chronic interstitial pneumonia, sarcoidosis, left ventricular failure, ACEI-induced cough, and aspiration from a condition associated with pharyngeal dysfunction.    Chronic cough is often simultaneously caused by more than one condition. A single cause has been found from 38 to 82% of the time, multiple causes from 18 to 62%. Multiply caused cough has been the result of three diseases up to 42% of the time.       Based on hx and exam, this is most likely:  Classic Upper airway cough syndrome, so named because it's frequently impossible to sort out how much is  CR/sinusitis with freq throat clearing (which can be related to primary GERD)   vs  causing  secondary (" extra esophageal")  GERD from wide swings in gastric pressure that occur with throat clearing, often  promoting self use of mint and menthol lozenges that reduce the lower esophageal sphincter tone and exacerbate the problem further in a cyclical fashion.   These are the same pts (now being labeled as having "irritable larynx syndrome" by some cough centers) who not infrequently have a history of having failed to tolerate ace inhibitors,  dry powder inhalers or biphosphonates or report having atypical reflux symptoms that don't  respond to standard doses of PPI , and are easily confused as having aecopd or asthma flares by even experienced allergists/ pulmonologists.   The first step is to maximize acid suppression and eliminate cyclical coughing / eval sinus ct then regroup in 4 wks   Total time = 81m review case with pt/ discussion/ counseling/ giving and going over instructions (see avs)    The standardized cough guidelines published in Chest by Lissa Morales in 2006 are still the best available and consist of a multiple step process (up to 12!) , not a single office visit,  and are intended  to address this problem logically,  with an alogrithm dependent on response to empiric treatment at  each progressive step  to determine a specific diagnosis with  minimal addtional testing needed. Therefore if adherence is an issue or can't be accurately verified,  it's very unlikely the standard evaluation and treatment will be successful here.    Furthermore, response to therapy (other than acute cough suppression, which should only be used short term with avoidance of narcotic containing cough syrups if possible), can be a gradual process for which the patient may perceive immediate benefit.  Unlike going to an eye doctor where the best perscription is almost always the first one and is immediately effective, this is almost never the case in the management of chronic cough syndromes. Therefore the patient needs to commit up front to consistently adhere to recommendations  for up to 6 weeks of therapy directed at the likely underlying problem(s) before the response can be reasonably evaluated.   Each maintenance medication was  reviewed in detail including most importantly the difference between maintenance and as needed and under what circumstances the prns are to be used.  Please see instructions for details which were reviewed in writing and the patient given a copy.

## 2015-03-18 NOTE — Telephone Encounter (Signed)
Per 03/16/15 OV: Protonix 40 mg Take 30- 60 min before your first and last meals of the day --  Called made Richardson Landry from pharmacy aware. Nothing further needed

## 2015-03-21 NOTE — Progress Notes (Signed)
Quick Note:  LMTCB ______ 

## 2015-04-13 ENCOUNTER — Ambulatory Visit: Payer: 59 | Admitting: Internal Medicine

## 2015-04-19 ENCOUNTER — Encounter: Payer: Self-pay | Admitting: Internal Medicine

## 2015-04-19 ENCOUNTER — Ambulatory Visit (INDEPENDENT_AMBULATORY_CARE_PROVIDER_SITE_OTHER): Payer: 59 | Admitting: Internal Medicine

## 2015-04-19 VITALS — BP 130/76 | HR 80 | Temp 99.3°F | Ht 68.0 in | Wt 214.0 lb

## 2015-04-19 DIAGNOSIS — R05 Cough: Secondary | ICD-10-CM

## 2015-04-19 DIAGNOSIS — R058 Other specified cough: Secondary | ICD-10-CM

## 2015-04-19 MED ORDER — PREDNISONE 10 MG PO TABS: ORAL_TABLET | ORAL | Status: DC

## 2015-04-19 NOTE — Progress Notes (Signed)
Subjective:    Patient ID: Cassandra Morris, female    DOB: 02/10/1951   MRN: [id number]    History of Present Illness  64 yowf quit smoking 1986 no sequelae then around 2011 noted nasal congestion and drainage with the [facility] pears tends to linger until winter until cool dry air hits / [place] with sensaation of pnds / choking and refractory to abx and steroids so referred to pulmonary clinic 03/16/2015 by Dr Marshall Cork   History of Present Illness  03/16/2015 1st [facility] office visit/ Cassandra Morris   Chief Complaint  Patient presents with  . Pulmonary Consult    Referred by Dr. Cathlean Cower. Pt c/o cough x 2 months- non prod. She states that she always has cough that starts in the Spring.   correction onset x  4 months not two/indolent and persistent daily  cough = dry assoc with throat clearing worse when walk outside in hot humidity or wet weather, better at hs  Worse on advair, barely better with prednisone  rec Start singulair 10 mg (montelukast) every pm  Try zyrtec 10 mg each am if it doesn't make you sleepy Protonix 40 mg Take 30- 60 min before your first and last meals of the day  GERD diet     04/19/2015 f/u ov/Cassandra Morris re: recurrent cough x worse at least 5 years / maybe really started around age 64   Chief Complaint  Patient presents with  . Follow-up    Pt states that her cough had improved, and then worsened again over the past 2 days. Cough is non prod. She also c/o runny nose.     Body mass index is 32.55 kg/(m^2).   Cough is day >> noct/ dry >> wet  No obvious day to day or daytime variability or assoc sob cp or chest tightness, subjective wheeze or overt sinus or hb symptoms. No unusual exp hx or h/o childhood pna/ asthma or knowledge of premature birth.  Sleeping ok without nocturnal  or early am exacerbation  of respiratory  c/o's or need for noct saba. Also denies any obvious fluctuation of symptoms with weather or environmental changes or other  aggravating or alleviating factors except as outlined above   Current Medications, Allergies, Complete Past Medical History, Past Surgical History, Family History, and Social History were reviewed in [employer] record.  ROS  The following are not active complaints unless bolded sore throat, dysphagia, dental problems, itching, sneezing,  nasal congestion or excess/ purulent secretions, ear ache,   fever, chills, sweats, unintended wt loss, classically pleuritic or exertional cp, hemoptysis,  orthopnea pnd or leg swelling, presyncope, palpitations, abdominal pain, anorexia, nausea, vomiting, diarrhea  or change in bowel or bladder habits, change in stools or urine, dysuria,hematuria,  rash, arthralgias, visual complaints, headache, numbness, weakness or ataxia or problems with walking or coordination,  change in mood/affect or memory.                        Objective:   Physical Exam  amb wf nad constant throat clearing   04/19/2015         214   Wt Readings from Last 3 Encounters:  03/16/15 206 lb 6.4 oz (93.622 kg)  03/02/15 206 lb (93.441 kg)  01/11/15 201 lb (91.173 kg)      HEENT: nl dentition, turbinates, and orophanx. Nl external ear canals without cough reflex   NECK :  without  JVD/Nodes/TM/ nl carotid upstrokes bilaterally   LUNGS: no acc muscle use, clear to A and P bilaterally without cough on insp   maneuvers   CV:  RRR  no s3 or murmur or increase in P2, no edema   ABD:  soft and nontender with nl excursion in the supine position. No bruits or organomegaly, bowel sounds nl  MS:  warm without deformities, calf tenderness, cyanosis or clubbing  SKIN: warm and dry without lesions    NEURO:  alert, approp, no deficits      I personally reviewed images and agree with radiology impression as follows:  CXR:  03/02/15 The heart size and vascular pattern are normal. Mediastinal and hilar contours stable. No infiltrate or effusion.         Assessment & Plan:

## 2015-04-19 NOTE — Patient Instructions (Signed)
For drainage take chlortrimeton (chlorpheniramine) 4 mg every 4 hours available over the counter (may cause drowsiness)   GERD (REFLUX)  is an extremely common cause of respiratory symptoms just like yours , many times with no obvious heartburn at all.    It can be treated with medication, but also with lifestyle changes including elevation of the head of your bed (ideally with 6 inch  bed blocks),  Smoking cessation, avoidance of late meals, excessive alcohol, and avoid fatty foods, chocolate, peppermint, colas, red wine, and acidic juices such as orange juice.  NO MINT OR MENTHOL PRODUCTS SO NO COUGH DROPS  USE SUGARLESS CANDY INSTEAD (Jolley ranchers or Stover's or Life Savers) or even ice chips will also do - the key is to swallow to prevent all throat clearing. NO OIL BASED VITAMINS - use powdered substitutes.    If still coughing > use cough medication instead of delsym  If all else fails take   Prednisone 10 mg take  4 each am x 2 days,   2 each am x 2 days,  1 each am x 2 days and stop

## 2015-04-19 NOTE — Assessment & Plan Note (Signed)
-   allergy profile 03/16/2015 >   Eos 0.3,   IgE 6 neg RAST > try singulair  - Sinus CT 03/18/2015  : mild chronic changes only   Incessant daytime throat clearing is typical of  Classic Upper airway cough syndrome, so named because it's frequently impossible to sort out how much is  CR/sinusitis with freq throat clearing (which can be related to primary GERD)   vs  causing  secondary (" extra esophageal")  GERD from wide swings in gastric pressure that occur with throat clearing, often  promoting self use of mint and menthol lozenges that reduce the lower esophageal sphincter tone and exacerbate the problem further in a cyclical fashion.   These are the same pts (now being labeled as having "irritable larynx syndrome" by some cough centers) who not infrequently have a history of having failed to tolerate ace inhibitors,  dry powder inhalers or biphosphonates or report having atypical reflux symptoms that don't respond to standard doses of PPI , and are easily confused as having aecopd or asthma flares by even experienced allergists/ pulmonologists.  rec add 1st gen h1 and if not effective needs rx for irritable larynx with gabapentin  I had an extended discussion with the patient reviewing all relevant studies completed to date and  lasting 15 to 20 minutes of a 25 minute visit    Cyclical cough syndrome reviewed including how to eliminate it    Each maintenance medication was reviewed in detail including most importantly the difference between maintenance and prns and under what circumstances the prns are to be triggered using an action plan format that is not reflected in the computer generated alphabetically organized AVS.    Please see instructions for details which were reviewed in writing and the patient given a copy highlighting the part that I personally wrote and discussed at today's ov.

## 2015-06-28 ENCOUNTER — Other Ambulatory Visit: Payer: Self-pay | Admitting: *Deleted

## 2015-06-29 ENCOUNTER — Encounter: Payer: Self-pay | Admitting: *Deleted

## 2015-06-29 NOTE — Patient Outreach (Signed)
[facility]) Care Management   06/28/2015  Ader Fritze Rabold 1951-02-11 [id number]  Carynn Felling Racz is an 64 y.o. female who presents to the [place] Management office for routine Link To Wellness follow up for self management assistance with Type II DM, HTN and hyperlipidemia.  Subjective:  Maaliyah says she was diagnosed with upper respiratory cough syndrome in July and has been seeing Dr. Melvyn Novas for treatment. She says the symptoms are a dry cough when she is upright and is being treated with cough suppressant and Protonix. Hydee states she remains highly dressed due to family issues concerning her 19 year old Mom and her brother so she says she has been stress eating and not taking her medications as prescribed although she is taking her insulins. She says she is  occasionally checking her blood sugars and when she doesn't feel well. She reports values from 300 to 82 in the last month.She continues to prefer the use of herbs and supplements to treat her chronic disease states.  She says she takes Turmeric, and Cinnamon capsules for the antiinflammatory effects and recently started taking Isotonic OPC (antioxidant) and AMPK Activator for weight loss assist. She says she is going to start an alkaline meal plan to help with weight loss and glycemic control. When asked what she does to reduce stress she said she recently went on vacation with her dog to [facility] and [place] and enjoyed the time to herself. She also enjoys watching crime shows on TV.  Objective:   Review of Systems  Constitutional: Negative.   Respiratory: Positive for cough.     Physical Exam  Constitutional: She is oriented to person, place, and time. She appears well-developed and well-nourished.  Neurological: She is alert and oriented to person, place, and time.  Skin: Skin is warm and dry.  Psychiatric: She has a normal mood and affect. Her behavior is normal. Judgment and thought  content normal.   Filed Weights   06/28/15 1608  Weight: 212 lb 3.2 oz (96.253 kg)   Filed Vitals:   06/28/15 1608  BP: 140/80   Outpatient Encounter Prescriptions as of 06/28/2015  Medication Sig Note  . Adenosine Phosphate (ADENOSINE-5-MONOPHOSPHATE) POWD 1 capsule by Does not apply route. 06/29/2015: She calls it AMPK  . ALPRAZolam (XANAX) 0.25 MG tablet Take 0.25 mg by mouth daily. 06/28/2015: Takes in the morning  . Cinnamon 500 MG capsule Take 500 mg by mouth daily.   . Cyanocobalamin (VITAMIN B-12) 2500 MCG SUBL Place 2 each under the tongue daily.   01/06/2015: As needed  . Insulin Glargine (LANTUS SOLOSTAR) 100 UNIT/ML Solostar Pen Inject 40 Units into the skin daily at 10 pm. 06/28/2015: Taking 50 units daily  . losartan (COZAAR) 50 MG tablet Take 50 mg by mouth daily.   . pantoprazole (PROTONIX) 40 MG tablet Take 1 tablet (40 mg total) by mouth 2 (two) times daily before a meal.   . PINE BARK, PYCNOGENOL, PO Take 1 capsule by mouth. 06/29/2015: Takes isotonic OPCV which also contains bilberry   . sertraline (ZOLOFT) 50 MG tablet Take 50 mg by mouth daily. Take 1/2 tablet daily for 7 days, then increase to 1 tablet daily. 01/06/2015: Takes 1 1/2 tablets  . TURMERIC PO Take 1 capsule by mouth daily.   Marland Kitchen UNIFINE PENTIPS 31G X 8 MM MISC USE 4 TIMES DAILY   . Cholecalciferol (VITAMIN D3) 400 UNITS CAPS Take 8 capsules by mouth.   Marland Kitchen HUMALOG  KWIKPEN 100 UNIT/ML KiwkPen INJECT 10-15 UNITS 3 TIMES DAILY 15 MINUTES BEFORE MEALS   . HYDROcodone-homatropine (HYCODAN) 5-1.5 MG/5ML syrup Take 5 mLs by mouth every 6 (six) hours as needed for cough. (Patient not taking: Reported on 06/28/2015)   . Magnesium 250 MG TABS Take by mouth.   . montelukast (SINGULAIR) 10 MG tablet One at bedtime every night (Patient not taking: Reported on 06/28/2015) 06/28/2015: Not taking due to side effects of adding to depression   No facility-administered encounter medications on file as of 06/28/2015.    No current  facility-administered medications for this visit.    Functional Status:   In your present state of health, do you have any difficulty performing the following activities: 06/28/2015 01/06/2015  Hearing? N N  Vision? N N  Difficulty concentrating or making decisions? N -  Walking or climbing stairs? N N  Dressing or bathing? N -  Doing errands, shopping? 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  Plan:   [facility] CM Care Plan Problem One        Most Recent Value   Care Plan Problem One  Patient with Type II DM (IDDM) not meeting Hgb A1C target as evidenced by A1C= 8.8% on 02/24/15 (previous Hgb A1C was 8.8% on 11/29/14, HTN and hyperlipidemia not meeting treatment targets as evidenced by lipid profile on 02/24/15 showing elevated cholesterol, triglycerides and LDL.      Role Documenting the Problem One  Care Management [place] for Problem One  Active   [facility] Long Term Goal (31-90 days)  Improved glycemic control as evidenced by Hgb A1C <8.5% at next assessment, improved lipid profile at next assessment and ongoing normal BP readings    [facility] Long Term Goal Start Date  06/28/15   Interventions for Problem One Long Term Goal   reviewed patient medications, discussed DM medications of Lantus and Humalog including the mechanism of action, common side effects, dosages and dosing schedule,onset and duration of action, reinforced importance of taking all all medications as prescribed, spent most of the appointment discussing the ongoing stressors in her life and how stress impacts blood sugar control, encouraged Cherika to keep follow up appointments with her counselor, encouraged Kadedra to make a follow up appointment with Dr. Cruzita Lederer  as she is overdue, will arrange for Link To  Wellness follow up after she sees Dr. Jenny Reichmann on 09/02/15      RNCM to fax today's office visit note to Dr. Jenny Reichmann and Dr. Cruzita Lederer. RNCM will meet quarterly and as needed with patient per Link To Wellness program guidelines to assist with Type II DM, hyperlipidemia and HTN self-management and assess patient's progress toward mutually set goals.  Barrington Ellison RN,CCM,CDE [place] Management Coordinator Link To Wellness Office Phone [phone] Office Fax [phone]

## 2015-07-29 ENCOUNTER — Telehealth: Payer: Self-pay

## 2015-07-29 MED ORDER — INSULIN GLARGINE 100 UNIT/ML SOLOSTAR PEN: 40.0000 [IU] | PEN_INJECTOR | Freq: Every day | SUBCUTANEOUS | Status: DC

## 2015-07-29 NOTE — Telephone Encounter (Signed)
Refill request

## 2015-09-01 MED FILL — SERTRALINE HCL 100 MG TAB: 100 | 30 days supply | Qty: 45 | Fill #1

## 2015-09-02 ENCOUNTER — Encounter: Payer: Self-pay | Admitting: Internal Medicine

## 2015-09-02 ENCOUNTER — Ambulatory Visit (INDEPENDENT_AMBULATORY_CARE_PROVIDER_SITE_OTHER): Payer: 59 | Admitting: Internal Medicine

## 2015-09-02 ENCOUNTER — Other Ambulatory Visit (INDEPENDENT_AMBULATORY_CARE_PROVIDER_SITE_OTHER): Payer: 59

## 2015-09-02 VITALS — BP 134/74 | HR 87 | Temp 99.4°F | Ht 68.0 in | Wt 211.0 lb

## 2015-09-02 DIAGNOSIS — IMO0001 Reserved for inherently not codable concepts without codable children: Secondary | ICD-10-CM

## 2015-09-02 DIAGNOSIS — E1165 Type 2 diabetes mellitus with hyperglycemia: Secondary | ICD-10-CM

## 2015-09-02 DIAGNOSIS — Z0189 Encounter for other specified special examinations: Secondary | ICD-10-CM

## 2015-09-02 DIAGNOSIS — Z0000 Encounter for general adult medical examination without abnormal findings: Secondary | ICD-10-CM

## 2015-09-02 DIAGNOSIS — E785 Hyperlipidemia, unspecified: Secondary | ICD-10-CM | POA: Diagnosis not present

## 2015-09-02 DIAGNOSIS — R7989 Other specified abnormal findings of blood chemistry: Secondary | ICD-10-CM | POA: Diagnosis not present

## 2015-09-02 DIAGNOSIS — I10 Essential (primary) hypertension: Secondary | ICD-10-CM

## 2015-09-02 DIAGNOSIS — J3489 Other specified disorders of nose and nasal sinuses: Secondary | ICD-10-CM

## 2015-09-02 DIAGNOSIS — F329 Major depressive disorder, single episode, unspecified: Secondary | ICD-10-CM

## 2015-09-02 DIAGNOSIS — F32A Depression, unspecified: Secondary | ICD-10-CM

## 2015-09-02 LAB — LIPID PANEL
CHOLESTEROL: 228 mg/dL — AB (ref 0–200)
HDL: 38.7 mg/dL — ABNORMAL LOW (ref >39.00)
NonHDL: 188.8
Total CHOL/HDL Ratio: 6
Triglycerides: 288 mg/dL — ABNORMAL HIGH (ref 0.0–149.0)
VLDL: 57.6 mg/dL — ABNORMAL HIGH (ref 0.0–40.0)

## 2015-09-02 LAB — CBC WITH DIFFERENTIAL/PLATELET
BASOS ABS: 0.1 10*3/uL (ref 0.0–0.1)
Basophils Relative: 0.8 % (ref 0.0–3.0)
EOS ABS: 0.3 10*3/uL (ref 0.0–0.7)
Eosinophils Relative: 2.5 % (ref 0.0–5.0)
HEMATOCRIT: 42.5 % (ref 36.0–46.0)
HEMOGLOBIN: 14.1 g/dL (ref 12.0–15.0)
LYMPHS PCT: 35.2 % (ref 12.0–46.0)
Lymphs Abs: 3.9 10*3/uL (ref 0.7–4.0)
MCHC: 33.2 g/dL (ref 30.0–36.0)
MCV: 81.8 fl (ref 78.0–100.0)
MONO ABS: 0.6 10*3/uL (ref 0.1–1.0)
Monocytes Relative: 5.2 % (ref 3.0–12.0)
Neutro Abs: 6.2 10*3/uL (ref 1.4–7.7)
Neutrophils Relative %: 56.3 % (ref 43.0–77.0)
Platelets: 257 10*3/uL (ref 150.0–400.0)
RBC: 5.19 Mil/uL — ABNORMAL HIGH (ref 3.87–5.11)
RDW: 13.4 % (ref 11.5–15.5)
WBC: 11 10*3/uL — AB (ref 4.0–10.5)

## 2015-09-02 LAB — URINALYSIS, ROUTINE W REFLEX MICROSCOPIC
BILIRUBIN URINE: NEGATIVE
HGB URINE DIPSTICK: NEGATIVE
KETONES UR: NEGATIVE
NITRITE: NEGATIVE
Specific Gravity, Urine: 1.025 (ref 1.000–1.030)
URINE GLUCOSE: NEGATIVE
UROBILINOGEN UA: 0.2 (ref 0.0–1.0)
pH: 6.5 (ref 5.0–8.0)

## 2015-09-02 LAB — BASIC METABOLIC PANEL
BUN: 18 mg/dL (ref 6–23)
CHLORIDE: 101 meq/L (ref 96–112)
CO2: 33 mEq/L — ABNORMAL HIGH (ref 19–32)
Calcium: 9.5 mg/dL (ref 8.4–10.5)
Creatinine, Ser: 1.15 mg/dL (ref 0.40–1.20)
GFR: 50.42 mL/min — ABNORMAL LOW (ref >60.00)
GLUCOSE: 182 mg/dL — AB (ref 70–99)
POTASSIUM: 3.9 meq/L (ref 3.5–5.1)
SODIUM: 139 meq/L (ref 135–145)

## 2015-09-02 LAB — HEMOGLOBIN A1C: HEMOGLOBIN A1C: 11.1 % — AB (ref 4.6–6.5)

## 2015-09-02 LAB — LDL CHOLESTEROL, DIRECT: Direct LDL: 151 mg/dL

## 2015-09-02 LAB — HEPATIC FUNCTION PANEL
ALK PHOS: 89 U/L (ref 39–117)
ALT: 13 U/L (ref 0–35)
AST: 13 U/L (ref 0–37)
Albumin: 3.8 g/dL (ref 3.5–5.2)
BILIRUBIN TOTAL: 0.3 mg/dL (ref 0.2–1.2)
Bilirubin, Direct: 0 mg/dL (ref 0.0–0.3)
Total Protein: 6.6 g/dL (ref 6.0–8.3)

## 2015-09-02 LAB — MICROALBUMIN / CREATININE URINE RATIO
CREATININE, U: 302.6 mg/dL
MICROALB/CREAT RATIO: 0.5 mg/g (ref 0.0–30.0)
Microalb, Ur: 1.5 mg/dL (ref 0.0–1.9)

## 2015-09-02 LAB — TSH: TSH: 2.26 u[IU]/mL (ref 0.35–4.50)

## 2015-09-02 MED ORDER — DOXYCYCLINE HYCLATE 100 MG PO TABS: 100.0000 mg | ORAL_TABLET | Freq: Two times a day (BID) | ORAL | Status: DC

## 2015-09-02 MED ORDER — SERTRALINE HCL 100 MG PO TABS: ORAL_TABLET | ORAL | Status: DC

## 2015-09-02 MED ORDER — HYDROCODONE-HOMATROPINE 5-1.5 MG/5ML PO SYRP: 5.0000 mL | ORAL_SOLUTION | Freq: Four times a day (QID) | ORAL | Status: DC | PRN

## 2015-09-02 MED FILL — DOXYCYCLINE HYCLATE 100 MG: 100 | 10 days supply | Qty: 20 | Fill #0

## 2015-09-02 NOTE — Patient Instructions (Signed)
OK to increase the zoloft to 200 mg per day  Please take all new medication as prescribed  - the antibiotic  OK for the cough medicine refill, but try to use sparingly  Please continue all other medications as before, and refills have been done if requested.  Please have the pharmacy call with any other refills you may need.  Please continue your efforts at being more active, low cholesterol diet, and weight control.  Please keep your appointments with your specialists as you may have planned  Please go to the LAB in the Basement (turn left off the elevator) for the tests to be done today  You will be contacted by phone if any changes need to be made immediately.  Otherwise, you will receive a letter about your results with an explanation, but please check with MyChart first.  Please remember to sign up for MyChart if you have not done so, as this will be important to you in the future with finding out test results, communicating by private email, and scheduling acute appointments online when needed.  Please return in 6 months, or sooner if needed, with Lab testing done 3-5 days before

## 2015-09-02 NOTE — Progress Notes (Signed)
Subjective:    Patient ID: Cassandra Morris, female    DOB: Mar 09, 1951, 65 y.o.   MRN: [id number]  HPI  Here for wellness and f/u;  Overall doing ok;  Pt denies Chest pain, worsening SOB, DOE, wheezing, orthopnea, PND, worsening LE edema, palpitations, dizziness or syncope.  Pt denies neurological change such as new headache, facial or extremity weakness.  Pt denies polydipsia, polyuria, or low sugar symptoms. Pt states overall good compliance with treatment and medications, good tolerability, and has been trying to follow appropriate diet.  Pt denies worsening depressive symptoms, suicidal ideation or panic. No fever, night sweats, wt loss, loss of appetite, or other constitutional symptoms.  Pt states good ability with ADL's, has low fall risk, home safety reviewed and adequate, no other significant changes in hearing or vision, and only occasionally active with exercise.  Pt states overall fiar compliance with meds, not really trying to follow appropriate diet, with wt overall stable,  but little exercise however.  Thinks sugars and lipids likely high as she has not been compliant with diet due to stress and worsening depression.   Mult stressors, mother with recent hip fx, now perm in SNF.  No HI or SI  Has a sore to the left nose worse in the past wk.   Declines immunizations.  Past Medical History  Diagnosis Date  . Depression   . Diabetes mellitus     T2DM  . Hyperlipidemia   . Obesity (BMI 30-39.9)    No past surgical history on file.  reports that she quit smoking about 31 years ago. Her smoking use included Cigarettes. She has a 2.25 pack-year smoking history. She has never used smokeless tobacco. She reports that she drinks alcohol. She reports that she does not use illicit drugs. family history includes Allergies in her mother; Asthma in her mother; Deep vein thrombosis in her mother; Diabetes in her mother; Heart disease in her mother. Allergies  Allergen Reactions  . Metformin And  Related Diarrhea   Current Outpatient Prescriptions on File Prior to Visit  Medication Sig Dispense Refill  . Adenosine Phosphate (ADENOSINE-5-MONOPHOSPHATE) POWD 1 capsule by Does not apply route.    . ALPRAZolam (XANAX) 0.25 MG tablet Take 0.25 mg by mouth daily.    Marland Kitchen buPROPion (WELLBUTRIN SR) 150 MG 12 hr tablet Take 150 mg by mouth daily.    . Cholecalciferol (VITAMIN D3) 400 UNITS CAPS Take 8 capsules by mouth.    . Cinnamon 500 MG capsule Take 500 mg by mouth daily.    . Cyanocobalamin (VITAMIN B-12) 2500 MCG SUBL Place 2 each under the tongue daily.      Marland Kitchen HUMALOG KWIKPEN 100 UNIT/ML KiwkPen INJECT 10-15 UNITS 3 TIMES DAILY 15 MINUTES BEFORE MEALS 15 mL 5  . Insulin Glargine (LANTUS SOLOSTAR) 100 UNIT/ML Solostar Pen Inject 40 Units into the skin daily at 10 pm. 30 mL 2  . losartan (COZAAR) 50 MG tablet Take 50 mg by mouth daily.    . Magnesium 250 MG TABS Take by mouth.    Glory Rosebush VERIO test strip USE AS INSTRUCTED 100 each 12  . pantoprazole (PROTONIX) 40 MG tablet Take 1 tablet (40 mg total) by mouth 2 (two) times daily before a meal. 60 tablet 2  . PINE BARK, PYCNOGENOL, PO Take 1 capsule by mouth.    . predniSONE (DELTASONE) 10 MG tablet Take  4 each am x 2 days,   2 each am x 2 days,  1 each  am x 2 days and stop 14 tablet 0  . TURMERIC PO Take 1 capsule by mouth daily.    Marland Kitchen UNIFINE PENTIPS 31G X 8 MM MISC USE 4 TIMES DAILY 100 each 5  . montelukast (SINGULAIR) 10 MG tablet One at bedtime every night (Patient not taking: Reported on 06/28/2015) 30 tablet 2   No current facility-administered medications on file prior to visit.   Review of Systems Constitutional: Negative for increased diaphoresis, other activity, appetite or siginficant weight change other than noted HENT: Negative for worsening hearing loss, ear pain, facial swelling, mouth sores and neck stiffness.   Eyes: Negative for other worsening pain, redness or visual disturbance.  Respiratory: Negative for shortness  of breath and wheezing  Cardiovascular: Negative for chest pain and palpitations.  Gastrointestinal: Negative for diarrhea, blood in stool, abdominal distention or other pain Genitourinary: Negative for hematuria, flank pain or change in urine volume.  Musculoskeletal: Negative for myalgias or other joint complaints.  Skin: Negative for color change and wound or drainage.  Neurological: Negative for syncope and numbness. other than noted Hematological: Negative for adenopathy. or other swelling Psychiatric/Behavioral: Negative for hallucinations, SI, self-injury, decreased concentration or other worsening agitation.      Objective:   Physical Exam BP 134/74 mmHg  Pulse 87  Temp(Src) 99.4 F (37.4 C) (Oral)  Ht 5\' 8"  (1.727 m)  Wt 211 lb (95.709 kg)  BMI 32.09 kg/m2  SpO2 97% VS noted, not ill appearing Constitutional: Pt is oriented to person, place, and time. Appears well-developed and well-nourished, in no significant distress Head: Normocephalic and atraumatic.  Right Ear: External ear normal.  Left Ear: External ear normal.  Nose: Nose normal. Except for small tender sore distal nares Mouth/Throat: Oropharynx is clear and moist.  Eyes: Conjunctivae and EOM are normal. Pupils are equal, round, and reactive to light.  Neck: Normal range of motion. Neck supple. No JVD present. No tracheal deviation present or significant neck LA or mass Cardiovascular: Normal rate, regular rhythm, normal heart sounds and intact distal pulses.   Pulmonary/Chest: Effort normal and breath sounds without rales or wheezing  Abdominal: Soft. Bowel sounds are normal. NT. No HSM  Musculoskeletal: Normal range of motion. Exhibits no edema.  Lymphadenopathy:  Has no cervical adenopathy.  Neurological: Pt is alert and oriented to person, place, and time. Pt has normal reflexes. No cranial nerve deficit. Motor grossly intact Skin: Skin is warm and dry. No rash noted.  Psychiatric:  Has anxiuos/depressed  mood and affect. Behavior is normal.     Assessment & Plan:

## 2015-09-03 ENCOUNTER — Other Ambulatory Visit: Payer: Self-pay | Admitting: Internal Medicine

## 2015-09-03 DIAGNOSIS — E1149 Type 2 diabetes mellitus with other diabetic neurological complication: Secondary | ICD-10-CM

## 2015-09-03 DIAGNOSIS — Z794 Long term (current) use of insulin: Principal | ICD-10-CM

## 2015-09-03 MED ORDER — ROSUVASTATIN CALCIUM 20 MG PO TABS: 20.0000 mg | ORAL_TABLET | Freq: Every day | ORAL | Status: DC

## 2015-09-03 NOTE — Assessment & Plan Note (Signed)
Mild to mod, for antibx course,  to f/u any worsening symptoms or concerns 

## 2015-09-03 NOTE — Assessment & Plan Note (Signed)
stable overall by history and exam, recent data reviewed with pt, and pt to continue medical treatment as before,  to f/u any worsening symptoms or concerns, for f/u labs, for statin for LDl > 70

## 2015-09-03 NOTE — Assessment & Plan Note (Signed)

## 2015-09-03 NOTE — Assessment & Plan Note (Signed)
[place] for increased zoloft to 200 qd,  to f/u any worsening symptoms or concerns

## 2015-09-03 NOTE — Assessment & Plan Note (Signed)
stable overall by history and exam, recent data reviewed with pt, and pt to continue medical treatment as before,  to f/u any worsening symptoms or concerns BP Readings from Last 3 Encounters:  09/02/15 134/74  06/28/15 140/80  04/19/15 130/76

## 2015-09-03 NOTE — Assessment & Plan Note (Addendum)
?   Control, with hx of some diet noncomplicance, o/w stable overall by history and exam, most recent data reviewed with pt, and pt to continue medical treatment as before,  to f/u any worsening symptoms or concerns

## 2015-09-05 MED FILL — ROSUVASTATIN CALCIUM 20 MG: 20 | 90 days supply | Qty: 90 | Fill #0

## 2015-09-05 MED FILL — HYDROCODONE-HOMATROPINE SYR: 5-1.5 | 9 days supply | Qty: 180 | Fill #0

## 2015-10-04 MED FILL — SERTRALINE HCL 100 MG TAB: 100 | 90 days supply | Qty: 180 | Fill #0

## 2015-10-27 ENCOUNTER — Ambulatory Visit (INDEPENDENT_AMBULATORY_CARE_PROVIDER_SITE_OTHER): Payer: 59 | Admitting: Internal Medicine

## 2015-10-27 ENCOUNTER — Other Ambulatory Visit: Payer: Self-pay | Admitting: *Deleted

## 2015-10-27 ENCOUNTER — Encounter: Payer: Self-pay | Admitting: Internal Medicine

## 2015-10-27 VITALS — BP 130/80 | HR 82 | Temp 98.6°F | Resp 12 | Wt 204.0 lb

## 2015-10-27 DIAGNOSIS — E1165 Type 2 diabetes mellitus with hyperglycemia: Secondary | ICD-10-CM

## 2015-10-27 DIAGNOSIS — IMO0001 Reserved for inherently not codable concepts without codable children: Secondary | ICD-10-CM

## 2015-10-27 MED ORDER — TRUEPLUS LANCETS 33G MISC: Status: DC

## 2015-10-27 MED ORDER — INSULIN GLARGINE 100 UNIT/ML SOLOSTAR PEN: 50.0000 [IU] | PEN_INJECTOR | Freq: Every day | SUBCUTANEOUS | Status: DC

## 2015-10-27 MED ORDER — GLUCOSE BLOOD VI STRP: ORAL_STRIP | Status: DC

## 2015-10-27 MED ORDER — TRUE METRIX AIR GLUCOSE METER W/DEVICE KIT: 1.0000 | PACK | Freq: Every day | Status: DC

## 2015-10-27 NOTE — Patient Instructions (Signed)
Please increase Lantus to 50 units and move at bedtime and increase to 55-60 in 5 days if sugars not <150 in am  Increase Humalog to: 10 >> 14 units with a small meal 12 >> 16 units with a large meal or if you have dessert  Continue current SSI of Humalog - 150- 165: + 1 unit  - 166- 180: + 2 units  - 181- 195: + 3 units  - 196- 210: + 4 units  - 211- 225: + 5 units  - 226- 240: + 6 units  - 241- 255: + 7 units  - 256- 270: + 8 units  - 271- 285: + 9 units  - > 286: + 10 units   Please return in 1.5 months with your sugar log.

## 2015-10-27 NOTE — Progress Notes (Signed)
Subjective:     Patient ID: Cassandra Morris, female   DOB: 1950/09/20, 65 y.o.   MRN: [id number]  HPI Cassandra Morris is a pleasant 65 y.o. woman, returning for f/u for DM2, dx 2011, insulin-dependent, uncontrolled, without complications. Last visit 1 year and 4 mo ago!  Pt has severe depression, which she has every summer >> cannot control her sugars well.   Lab Results  Component Value Date   HGBA1C 11.1* 09/02/2015   HGBA1C 8.8* 02/24/2015   HGBA1C 8.8* 11/29/2014   She is now on a regimen of: Lantus 40  units before lunch Humalog : 10 units with a small meal 12 units with a large meal SSI of Humalog - 150- 165: + 1 unit  - 166- 180: + 2 units  - 181- 195: + 3 units  - 196- 210: + 4 units  - 211- 225: + 5 units  - 226- 240: + 6 units  - 241- 255: + 7 units  - 256- 270: + 8 units  - 271- 285: + 9 units  - > 286: + 10 units  She stopped Metformin XR 500 mg 2x a day >> diarrhea - stopped 1 year ago.  She mentions that she does take her insulin doses consistently along with her metformin.  She checks her sugars several times a day - 2x a day >> no log: - am: 130-185 >> 150-180, 200s when forgets Lantus >>180-190 >> 180 >> 186-220, 252 >> 110-150 >> 300-400s - 2h after b'fast: 110-190s >> 170-200 >> not checking >> 150-170 >> 223-209, 310 >> 127-190s, 237 >> n/c - prelunch: 90-190 >> 140s >> 170s >> 150s >> 151, 202-290 >> 117-193, 217 >> n/c - 2h postlunch: 99-191 >> 170-200 >> 200s >> 180-200 >> 126-175 >> 62, 92-178 >> 250-350 - predinner: 90-149 >> 90-185 >> 200s >> 150-180 >> 89, 182 >> 79-155, 170 >> n/c - 2h post dinner: 118-168 >> not checking >> 200 >> 285 >> 123-168 >> n/c - bedtime: 135-187 >> not checking >> 200 (grazes) >> 242 >> 144-195 >> n/c Lows: 66 x1. Highest: 500.  Glucometer: OneTouch Verio  - No CKD: Lab Results  Component Value Date   TSH 2.26 09/02/2015   CREATININE 1.15 09/02/2015  - No neuropathy.  - no HL: Lab Results  Component Value Date   CHOL 228* 09/02/2015   HDL 38.70* 09/02/2015   LDLCALC 61 07/16/2014   LDLDIRECT 151.0 09/02/2015   TRIG 288.0* 09/02/2015   CHOLHDL 6 09/02/2015  - She had an eye exam in Summer 2016. Dr Katy Fitch. She has a h/o iritis. Had cataract sx.   She also has a medical history of hypertension, hyperlipidemia, obesity.She saw a psychiatrist in 11/2013 >> Depakote and Lamictal >> she tapered these off as she felt terrible on these >> now having another psychiatrist >> Zoloft and Xanax.  I reviewed pt's medications, allergies, PMH, social hx, family hx, and changes were documented in the history of present illness. Otherwise, unchanged from my initial visit note.  She sees pulmonology for chronic cough.  Review of Systems Constitutional: no weight gain, + fatigue, + subjective hyperthermia, + nocturia Eyes: no blurry vision, no xerophthalmia ENT: no sore throat, no nodules palpated in throat, no dysphagia/odynophagia, + hoarseness Cardiovascular: no CP/SOB/palpitations/leg swelling Respiratory: + cough/no SOB Gastrointestinal: no N/VD/C Musculoskeletal: no muscle aches/no joint aches Neuro: no HAs  Objective:   Physical Exam BP 130/80 mmHg  Pulse 82  Temp(Src) 98.6 F (37 C) (  Oral)  Resp 12  Wt 204 lb (92.534 kg)  SpO2 94% Body mass index is 31.03 kg/(m^2). Wt Readings from Last 3 Encounters:  10/27/15 204 lb (92.534 kg)  09/02/15 211 lb (95.709 kg)  06/28/15 212 lb 3.2 oz (96.253 kg)  Constitutional: overweight, in NAD Eyes: unequal pupils L>R - surgical R pupil, EOMI, no exophthalmos ENT: moist mucous membranes, no thyromegaly, no cervical lymphadenopathy Cardiovascular: RRR, No MRG Respiratory: CTA B Gastrointestinal: abdomen soft, NT, ND, BS+ Musculoskeletal: no deformities, strength intact in all 4 Neuro:  + mild tremor with outstretched hands Skin: moist, warm, no rashes  Assessment:     1. DM2, insulin-dependent, uncontrolled, without complications     Plan:     Patient  with depression which greatly influences her DM control. She now returns after a long absence >> at last visit, she started a vegan diet >> sugars much improved. Now all VERY high as she is not following the diet and missed insulin doses.Strongly underlined the need for compliance but will also increase the doses of insulin as sugars are so high. We need to stay off Metformin, unfortunately, 2/2 diarrhea. - she has a new psychiatrist >> meds were changed - I advised her to: Patient Instructions  Please increase Lantus to 50 units and move at bedtime and increase to 55-60 in 5 days if sugars not <150 in am  Increase Humalog to: 10 >> 14 units with a small meal 12 >> 16 units with a large meal or if you have dessert  Continue current SSI of Humalog - 150- 165: + 1 unit  - 166- 180: + 2 units  - 181- 195: + 3 units  - 196- 210: + 4 units  - 211- 225: + 5 units  - 226- 240: + 6 units  - 241- 255: + 7 units  - 256- 270: + 8 units  - 271- 285: + 9 units  - > 286: + 10 units   Please return in 1.5 months with your sugar log.   - had a PNA vaccine 04/27/2014 - no labs for now - I will see the patient back in 1.5 months with her sugar log   Needs True Metrix + supplies.

## 2015-10-28 MED FILL — TRUEplus LANCETS 30G MISC: 30 days supply | Qty: 100 | Fill #0

## 2015-10-28 MED FILL — TRUE METRIX GLUCOSE TEST ST: 30 days supply | Qty: 100 | Fill #0

## 2015-12-02 MED FILL — TRUE METRIX GLUCOSE TEST ST: 30 days supply | Qty: 100 | Fill #1

## 2015-12-02 MED FILL — TRUEplus LANCETS 30G MISC: 30 days supply | Qty: 100 | Fill #1

## 2015-12-12 ENCOUNTER — Encounter: Payer: Self-pay | Admitting: Internal Medicine

## 2015-12-12 ENCOUNTER — Ambulatory Visit (INDEPENDENT_AMBULATORY_CARE_PROVIDER_SITE_OTHER): Payer: 59 | Admitting: Internal Medicine

## 2015-12-12 VITALS — BP 144/70 | HR 99 | Temp 99.0°F | Ht 68.0 in | Wt 202.5 lb

## 2015-12-12 DIAGNOSIS — IMO0001 Reserved for inherently not codable concepts without codable children: Secondary | ICD-10-CM | POA: Insufficient documentation

## 2015-12-12 DIAGNOSIS — E1165 Type 2 diabetes mellitus with hyperglycemia: Secondary | ICD-10-CM | POA: Diagnosis not present

## 2015-12-12 LAB — POCT GLYCOSYLATED HEMOGLOBIN (HGB A1C): HEMOGLOBIN A1C: 9.9

## 2015-12-12 NOTE — Progress Notes (Signed)
Pre visit review using our clinic review tool, if applicable. No additional management support is needed unless otherwise documented below in the visit note. 

## 2015-12-12 NOTE — Progress Notes (Signed)
Subjective:     Patient ID: Cassandra Morris, female   DOB: 09-03-1950, 65 y.o.   MRN: [id number]  HPI Cassandra Morris is a pleasant 65 y.o. woman, returning for f/u for DM2, dx 2011, insulin-dependent, uncontrolled, without complications. Last visit 1.5 mo ago.  Pt has severe depression, which she has every summer >> cannot control her sugars well.   Since last visit >> sugars are better, but still high and fluctuating.  Lab Results  Component Value Date   HGBA1C 11.1* 09/02/2015   HGBA1C 8.8* 02/24/2015   HGBA1C 8.8* 11/29/2014   She is now on a regimen of: Lantus 40 >> 50 units at bedtime Humalog: 10 >> 14 units with a small meal 12 >> 16 units with a large meal SSI of Humalog - 150- 165: + 1 unit  - 166- 180: + 2 units  - 181- 195: + 3 units  - 196- 210: + 4 units  - 211- 225: + 5 units  - 226- 240: + 6 units  - 241- 255: + 7 units  - 256- 270: + 8 units  - 271- 285: + 9 units  - > 286: + 10 units  She stopped Metformin XR 500 mg 2x a day >> diarrhea - stopped. She mentions that she does take her insulin doses consistently along with her metformin.  She checks her sugars several times a day - 2x a day >> reviewed log: - am: 180-190 >> 180 >> 186-220, 252 >> 110-150 >> 300-400s >> 171-381 - 2h after b'fast: 170-200 >> not checking >> 150-170 >> 223-209, 310 >> 127-190s, 237 >> n/c >> 210, 273 - prelunch: 90-190 >> 140s >> 170s >> 150s >> 151, 202-290 >> 117-193, 217 >> n/c >> 104-259, 382 - 2h postlunch: 99-191 >> 170-200 >> 200s >> 180-200 >> 126-175 >> 62, 92-178 >> 250-350 >> 130-289 - predinner: 90-149 >> 90-185 >> 200s >> 150-180 >> 89, 182 >> 79-155, 170 >> n/c >> 98-295 - 2h post dinner: 118-168 >> not checking >> 200 >> 285 >> 123-168 >> n/c - bedtime: 135-187 >> not checking >> 200 (grazes) >> 242 >> 144-195 >> n/c >> 122, 254 Lows: 66 x1. Highest: 500.  Glucometer: OneTouch Verio  - No CKD: Lab Results  Component Value Date   TSH 2.26 09/02/2015   CREATININE  1.15 09/02/2015  - No neuropathy.  - no HL: Lab Results  Component Value Date   CHOL 228* 09/02/2015   HDL 38.70* 09/02/2015   LDLCALC 61 07/16/2014   LDLDIRECT 151.0 09/02/2015   TRIG 288.0* 09/02/2015   CHOLHDL 6 09/02/2015  - She had an eye exam in Summer 2016. Dr Katy Fitch. She has a h/o iritis. Had cataract sx.   She also has a medical history of hypertension, hyperlipidemia, obesity.She saw a psychiatrist in 11/2013 >> Depakote and Lamictal >> she tapered these off as she felt terrible on these >> now having another psychiatrist >> Zoloft and Xanax.  I reviewed pt's medications, allergies, PMH, social hx, family hx, and changes were documented in the history of present illness. Otherwise, unchanged from my initial visit note.  She sees pulmonology for chronic cough.  Review of Systems Constitutional: no weight gain, + fatigue, + subjective hyperthermia, + nocturia Eyes: no blurry vision, no xerophthalmia ENT: no sore throat, no nodules palpated in throat, no dysphagia/odynophagia, + hoarseness Cardiovascular: no CP/SOB/palpitations/leg swelling Respiratory: + cough/no SOB Gastrointestinal: no N/VD/C Musculoskeletal: no muscle aches/no joint aches Neuro: no HAs, +  tremors  Objective:   Physical Exam BP 144/70 mmHg  Pulse 99  Temp(Src) 99 F (37.2 C) (Oral)  Ht 5\' 8"  (1.727 m)  Wt 202 lb 8 oz (91.853 kg)  BMI 30.80 kg/m2  SpO2 95% Body mass index is 30.8 kg/(m^2). Wt Readings from Last 3 Encounters:  12/12/15 202 lb 8 oz (91.853 kg)  10/27/15 204 lb (92.534 kg)  09/02/15 211 lb (95.709 kg)  Constitutional: overweight, in NAD Eyes: unequal pupils L>R - surgical R pupil, EOMI, no exophthalmos ENT: moist mucous membranes, no thyromegaly, no cervical lymphadenopathy Cardiovascular: RRR, No MRG Respiratory: CTA B Gastrointestinal: abdomen soft, NT, ND, BS+ Musculoskeletal: no deformities, strength intact in all 4 Neuro:  + mild tremor with outstretched hands Skin: moist,  warm, no rashes  Assessment:     1. DM2, insulin-dependent, uncontrolled, without complications     Plan:     Patient with depression which greatly influences her DM control. Sugars werel VERY high at last visit as she was not following the diet and missed insulin doses. Now doing better as she is taking her insulin more frequently and we also increased her insulin doses at last visit. She did not remember to also increase Lantus >> will advised her to do so as her sugars in am are the highest of the day. -  We need to stay off Metformin, unfortunately, 2/2 diarrhea. - I advised her to: Patient Instructions  Please increase Lantus to 50 units at bedtime.  Continue Humalog 8-16 units before a meal.  Continue current SSI of Humalog - 150- 165: + 1 unit  - 166- 180: + 2 units  - 181- 195: + 3 units  - 196- 210: + 4 units  - 211- 225: + 5 units  - 226- 240: + 6 units  - 241- 255: + 7 units  - 256- 270: + 8 units  - 271- 285: + 9 units  - > 286: + 10 units   Please return in 3 months with your sugar log.   - had a PNA vaccine 04/27/2014 - checked HbA1c now >> 9.9% (better) - I will see the patient back in 3 months with her sugar log

## 2015-12-12 NOTE — Patient Instructions (Signed)
Please increase Lantus to 50 units at bedtime.  Continue Humalog 8-16 units before a meal.  Continue current SSI of Humalog - 150- 165: + 1 unit  - 166- 180: + 2 units  - 181- 195: + 3 units  - 196- 210: + 4 units  - 211- 225: + 5 units  - 226- 240: + 6 units  - 241- 255: + 7 units  - 256- 270: + 8 units  - 271- 285: + 9 units  - > 286: + 10 units   Please return in 3 months with your sugar log.

## 2015-12-30 MED FILL — SERTRALINE HCL 100 MG TAB: 100 | 90 days supply | Qty: 180 | Fill #1

## 2015-12-30 MED FILL — TRUE METRIX GLUCOSE TEST ST: 30 days supply | Qty: 100 | Fill #2

## 2015-12-30 MED FILL — HUMALOG 100 UNITS/ML KWIKPE: 100 | 30 days supply | Qty: 15 | Fill #2

## 2015-12-30 MED FILL — LANTUS SOLOSTAR 100 UNITS/M: 100 | 75 days supply | Qty: 30 | Fill #1

## 2015-12-30 MED FILL — TRUEplus LANCETS 30G MISC: 30 days supply | Qty: 100 | Fill #2

## 2016-01-02 ENCOUNTER — Encounter: Payer: Self-pay | Admitting: Family Medicine

## 2016-01-02 ENCOUNTER — Ambulatory Visit (INDEPENDENT_AMBULATORY_CARE_PROVIDER_SITE_OTHER): Payer: 59 | Admitting: Family Medicine

## 2016-01-02 VITALS — BP 134/70 | HR 101 | Temp 100.8°F | Wt 209.0 lb

## 2016-01-02 DIAGNOSIS — H66002 Acute suppurative otitis media without spontaneous rupture of ear drum, left ear: Secondary | ICD-10-CM

## 2016-01-02 MED ORDER — AMOXICILLIN-POT CLAVULANATE 875-125 MG PO TABS: 1.0000 | ORAL_TABLET | Freq: Two times a day (BID) | ORAL | Status: DC

## 2016-01-02 MED FILL — AMOX TR-K CLV 875-125 MG TA: 875-125 | 7 days supply | Qty: 14 | Fill #0

## 2016-01-02 NOTE — Progress Notes (Signed)
PCP: Cathlean Cower, MD  Subjective:  Cassandra Morris is a 65 y.o. year old very pleasant female patient who presents with sinusitis symptoms including nasal congestion with yellow drainage, sinus tenderness in left maxillary area, left sided throat pain. Has fever today for first time. Left ear pain is most notable symptom -day of illness:4 -started: friday -Symptoms are worsening -previous treatments: rest, hydration -sick contacts/travel/risks: denies flu exposure.  -Hx of: allergies  ROS-denies fever, NVD, tooth pain. Very mild shortness of breath but that is on questioning- was not initial complaint. No difficulty walking down hall just feels more tired/winded.   Pertinent Past Medical History-  Patient Active Problem List   Diagnosis Date Noted  . Uncontrolled type 2 diabetes mellitus without complication, without long-term current use of insulin ([place]) 12/12/2015  . Wellness examination 03/02/2015  . Upper airway cough syndrome 03/02/2015  . Nasal sore 03/02/2015  . Allergic rhinitis 12/08/2014  . Diabetic polyneuropathy ([place]) 04/27/2014  . ABDOMINAL PAIN 09/26/2010  . Essential hypertension 07/26/2010  . Hyperlipidemia 12/02/2007  . Depression 12/02/2007    Medications- reviewed  Current Outpatient Prescriptions  Medication Sig Dispense Refill  . Adenosine Phosphate (ADENOSINE-5-MONOPHOSPHATE) POWD 1 capsule by Does not apply route.    . ALPRAZolam (XANAX) 0.25 MG tablet Take 0.25 mg by mouth daily.    . Blood Glucose Monitoring Suppl (TRUE METRIX AIR GLUCOSE METER) w/Device KIT 1 each by Does not apply route daily. Dx: E11.65 1 kit 1  . buPROPion (WELLBUTRIN SR) 150 MG 12 hr tablet Take 150 mg by mouth daily.    . Cholecalciferol (VITAMIN D3) 400 UNITS CAPS Take 8 capsules by mouth.    . Cinnamon 500 MG capsule Take 500 mg by mouth daily.    . Cyanocobalamin (VITAMIN B-12) 2500 MCG SUBL Place 2 each under the tongue daily.      Marland Kitchen glucose blood (TRUE METRIX BLOOD GLUCOSE  TEST) test strip Use to test blood sugar 3 times daily. Dx: E11.65 100 each 5  . HUMALOG KWIKPEN 100 UNIT/ML KiwkPen INJECT 10-15 UNITS 3 TIMES DAILY 15 MINUTES BEFORE MEALS 15 mL 5  . Insulin Glargine (LANTUS SOLOSTAR) 100 UNIT/ML Solostar Pen Inject 50 Units into the skin daily at 10 pm. 30 mL 2  . losartan (COZAAR) 50 MG tablet Take 50 mg by mouth daily.    . Magnesium 250 MG TABS Take by mouth.    Marland Kitchen OVER THE COUNTER MEDICATION Garcinia Cambogia with green coffee.    . pantoprazole (PROTONIX) 40 MG tablet Take 1 tablet (40 mg total) by mouth 2 (two) times daily before a meal. (Patient taking differently: Take 40 mg by mouth as needed. ) 60 tablet 2  . PINE BARK, PYCNOGENOL, PO Take 1 capsule by mouth.    . rosuvastatin (CRESTOR) 20 MG tablet Take 1 tablet (20 mg total) by mouth daily. 90 tablet 3  . sertraline (ZOLOFT) 100 MG tablet 2 tabs by mouth per day 180 tablet 3  . TRUEPLUS LANCETS 33G MISC Use to test blood sugar 3 times daily. Dx: E11.65 100 each 5  . TURMERIC PO Take 1 capsule by mouth daily.    Marland Kitchen UNIFINE PENTIPS 31G X 8 MM MISC USE 4 TIMES DAILY 100 each 5   No current facility-administered medications for this visit.    Objective: BP 134/70 mmHg  Pulse 101  Temp(Src) 100.8 F (38.2 C)  Wt 209 lb (94.802 kg)  SpO2 95% Gen: NAD, resting comfortably HEENT: Turbinates erythematous with yellow  drainage, TM normal on right, tympanic membrane on left is erythematous by comparison, pharynx mildly erythematous with no tonsilar exudate or edema, left sinus tenderness CV: RRR no murmurs rubs or gallops Lungs: CTAB no crackles, wheeze, rhonchi Abdomen: soft/nontender/nondistended/normal bowel sounds. No rebound or guarding.  Ext: no edema Skin: warm, dry, no rash Neuro: grossly normal, moves all extremities  Assessment/Plan:  Otitis Media Left but will cover for Sinsusitis Patient with left otitis media likely responsible for the fever and left ear pain. Also with left sided  sinus pressure- would normally treat with amoxicillin alone but will increase coverage to augmentin to cover sinusitis as cause of left sided sinus pain, fever, purulent drainage  Treatment: -symptomatic care with Mucinex -Antibiotic indicated: yes Meds ordered this encounter  Medications  . amoxicillin-clavulanate (AUGMENTIN) 875-125 MG tablet    Sig: Take 1 tablet by mouth 2 (two) times daily.    Dispense:  14 tablet    Refill:  0  Higher risk patient with type II diabetes and elevated a1c at 9.9- likely immunocompromised and higehr risk if treatment delayed   Finally, we reviewed reasons to return to care including if symptoms worsen or persist (not improving by 48 hours) or new concerns arise

## 2016-01-02 NOTE — Patient Instructions (Addendum)
Otitis Media Left but will cover for Sinsusitis Treatment: -symptomatic care with Mucinex -Antibiotic indicated: yes Meds ordered this encounter  Medications  . amoxicillin-clavulanate (AUGMENTIN) 875-125 MG tablet    Sig: Take 1 tablet by mouth 2 (two) times daily.    Dispense:  14 tablet    Refill:  0   Finally, we reviewed reasons to return to care including if symptoms worsen or persist (not improving by 48 hours) or new concerns arise

## 2016-03-01 ENCOUNTER — Encounter: Payer: Self-pay | Admitting: Internal Medicine

## 2016-03-01 ENCOUNTER — Ambulatory Visit (INDEPENDENT_AMBULATORY_CARE_PROVIDER_SITE_OTHER): Payer: 59 | Admitting: Internal Medicine

## 2016-03-01 VITALS — BP 130/70 | HR 96 | Temp 98.1°F | Resp 20 | Wt 205.0 lb

## 2016-03-01 DIAGNOSIS — E785 Hyperlipidemia, unspecified: Secondary | ICD-10-CM

## 2016-03-01 DIAGNOSIS — F329 Major depressive disorder, single episode, unspecified: Secondary | ICD-10-CM

## 2016-03-01 DIAGNOSIS — I10 Essential (primary) hypertension: Secondary | ICD-10-CM | POA: Diagnosis not present

## 2016-03-01 DIAGNOSIS — F32A Depression, unspecified: Secondary | ICD-10-CM

## 2016-03-01 DIAGNOSIS — Z794 Long term (current) use of insulin: Secondary | ICD-10-CM

## 2016-03-01 DIAGNOSIS — E1140 Type 2 diabetes mellitus with diabetic neuropathy, unspecified: Secondary | ICD-10-CM | POA: Diagnosis not present

## 2016-03-01 MED ORDER — ARIPIPRAZOLE 5 MG PO TABS: ORAL_TABLET | ORAL | Status: DC

## 2016-03-01 MED FILL — ARIPiprazole 5 MG TABS: 5 | 90 days supply | Qty: 45 | Fill #0

## 2016-03-01 NOTE — Progress Notes (Signed)
Pre visit review using our clinic review tool, if applicable. No additional management support is needed unless otherwise documented below in the visit note. 

## 2016-03-01 NOTE — Patient Instructions (Signed)
Please take all new medication as prescribed- the abilify at half of the 5 mg pill per day  Please continue all other medications as before, and refills have been done if requested.  Please have the pharmacy call with any other refills you may need.  Please continue your efforts at being more active, low cholesterol diabetic diet, and weight control.  Please keep your appointments with your specialists as you may have planned

## 2016-03-01 NOTE — Progress Notes (Signed)
Subjective:    Patient ID: Cassandra Morris, female    DOB: 1951/08/22, 65 y.o.   MRN: [id number]  HPI   Here to f/u, Pt denies chest pain, increased sob or doe, wheezing, orthopnea, PND, increased LE swelling, palpitations, dizziness or syncope.  Pt denies new neurological symptoms such as new headache, or facial or extremity weakness or numbness   Pt denies polydipsia, polyuria,  Does c/o worsening depression symptoms without SI or HI -  "I'm depressed" "I hate the summer, does not like trump, and mother in memory unit" Has had mild worsening depressive symptoms, but no suicidal ideation, or panic; has ongoing anxiety, not increased recently.  Was seeing psychiatry last yr, who since has left the area.  Declines further f/u, does go to the [facility], has xanax prn from them. Past Medical History  Diagnosis Date  . Depression   . Diabetes mellitus     T2DM  . Hyperlipidemia   . Obesity (BMI 30-39.9)    No past surgical history on file.  reports that she quit smoking about 31 years ago. Her smoking use included Cigarettes. She has a 2.25 pack-year smoking history. She has never used smokeless tobacco. She reports that she drinks alcohol. She reports that she does not use illicit drugs. family history includes Allergies in her mother; Asthma in her mother; Deep vein thrombosis in her mother; Diabetes in her mother; Heart disease in her mother. Allergies  Allergen Reactions  . Metformin And Related Diarrhea   Current Outpatient Prescriptions on File Prior to Visit  Medication Sig Dispense Refill  . Adenosine Phosphate (ADENOSINE-5-MONOPHOSPHATE) POWD 1 capsule by Does not apply route.    . ALPRAZolam (XANAX) 0.25 MG tablet Take 0.25 mg by mouth daily.    . Blood Glucose Monitoring Suppl (TRUE METRIX AIR GLUCOSE METER) w/Device KIT 1 each by Does not apply route daily. Dx: E11.65 1 kit 1  . Cholecalciferol (VITAMIN D3) 400 UNITS CAPS Take 8 capsules by mouth.    . Cinnamon 500 MG capsule  Take 500 mg by mouth daily.    . Cyanocobalamin (VITAMIN B-12) 2500 MCG SUBL Place 2 each under the tongue daily.      Marland Kitchen glucose blood (TRUE METRIX BLOOD GLUCOSE TEST) test strip Use to test blood sugar 3 times daily. Dx: E11.65 100 each 5  . HUMALOG KWIKPEN 100 UNIT/ML KiwkPen INJECT 10-15 UNITS 3 TIMES DAILY 15 MINUTES BEFORE MEALS 15 mL 5  . Insulin Glargine (LANTUS SOLOSTAR) 100 UNIT/ML Solostar Pen Inject 50 Units into the skin daily at 10 pm. 30 mL 2  . losartan (COZAAR) 50 MG tablet Take 50 mg by mouth daily.    . Magnesium 250 MG TABS Take by mouth.    Marland Kitchen OVER THE COUNTER MEDICATION Garcinia Cambogia with green coffee.    . pantoprazole (PROTONIX) 40 MG tablet Take 1 tablet (40 mg total) by mouth 2 (two) times daily before a meal. (Patient taking differently: Take 40 mg by mouth as needed. ) 60 tablet 2  . PINE BARK, PYCNOGENOL, PO Take 1 capsule by mouth.    . rosuvastatin (CRESTOR) 20 MG tablet Take 1 tablet (20 mg total) by mouth daily. 90 tablet 3  . sertraline (ZOLOFT) 100 MG tablet 2 tabs by mouth per day 180 tablet 3  . TRUEPLUS LANCETS 33G MISC Use to test blood sugar 3 times daily. Dx: E11.65 100 each 5  . TURMERIC PO Take 1 capsule by mouth daily.    Marland Kitchen  UNIFINE PENTIPS 31G X 8 MM MISC USE 4 TIMES DAILY 100 each 5   No current facility-administered medications on file prior to visit.   Review of Systems  Constitutional: Negative for unusual diaphoresis or night sweats HENT: Negative for ear swelling or discharge Eyes: Negative for worsening visual haziness  Respiratory: Negative for choking and stridor.   Gastrointestinal: Negative for distension or worsening eructation Genitourinary: Negative for retention or change in urine volume.  Musculoskeletal: Negative for other MSK pain or swelling Skin: Negative for color change and worsening wound Neurological: Negative for tremors and numbness other than noted  Psychiatric/Behavioral: Negative for decreased concentration or  agitation other than above       Objective:   Physical Exam BP 130/70 mmHg  Pulse 96  Temp(Src) 98.1 F (36.7 C) (Oral)  Resp 20  Wt 205 lb (92.987 kg)  SpO2 96% VS noted,  Constitutional: Pt appears in no apparent distress HENT: Head: NCAT.  Right Ear: External ear normal.  Left Ear: External ear normal.  Eyes: . Pupils are equal, round, and reactive to light. Conjunctivae and EOM are normal Neck: Normal range of motion. Neck supple.  Cardiovascular: Normal rate and regular rhythm.   Pulmonary/Chest: Effort normal and breath sounds without rales or wheezing.  Neurological: Pt is alert. Not confused , motor grossly intact Skin: Skin is warm. No rash, no LE edema Psychiatric: Pt behavior is normal. No agitation. borderline tearful, depressed affect    Assessment & Plan:

## 2016-03-04 NOTE — Assessment & Plan Note (Signed)
stable overall by history and exam, recent data reviewed with pt, and pt to continue medical treatment as before,  to f/u any worsening symptoms or concerns Lab Results  Component Value Date   LDLCALC 61 07/16/2014

## 2016-03-04 NOTE — Assessment & Plan Note (Signed)
Mild, verified no SI or HI, declines further referral to psychiatry or counseling, for add abilify 5 mg - 1/2 qd,  to f/u any worsening symptoms or concerns

## 2016-03-04 NOTE — Assessment & Plan Note (Signed)
stable overall by history and exam, recent data reviewed with pt, and pt to continue medical treatment as before,  to f/u any worsening symptoms or concerns BP Readings from Last 3 Encounters:  03/01/16 130/70  01/02/16 134/70  12/12/15 144/70

## 2016-03-04 NOTE — Assessment & Plan Note (Signed)
stable overall by history and exam, recent data reviewed with pt, and pt to continue medical treatment as before,  to f/u any worsening symptoms or concerns Lab Results  Component Value Date   HGBA1C 9.9 12/12/2015

## 2016-03-12 ENCOUNTER — Ambulatory Visit (INDEPENDENT_AMBULATORY_CARE_PROVIDER_SITE_OTHER): Payer: 59 | Admitting: Internal Medicine

## 2016-03-12 ENCOUNTER — Encounter: Payer: Self-pay | Admitting: Internal Medicine

## 2016-03-12 VITALS — BP 142/82 | HR 82 | Ht 68.0 in | Wt 204.0 lb

## 2016-03-12 DIAGNOSIS — Z794 Long term (current) use of insulin: Secondary | ICD-10-CM

## 2016-03-12 DIAGNOSIS — E1140 Type 2 diabetes mellitus with diabetic neuropathy, unspecified: Secondary | ICD-10-CM | POA: Insufficient documentation

## 2016-03-12 LAB — POCT GLYCOSYLATED HEMOGLOBIN (HGB A1C): HEMOGLOBIN A1C: 10.3

## 2016-03-12 MED ORDER — INSULIN LISPRO 100 UNIT/ML (KWIKPEN): PEN_INJECTOR | SUBCUTANEOUS | Status: DC

## 2016-03-12 NOTE — Patient Instructions (Signed)
Please continue: - Lantus 50 units at bedtime - Humalog: 15-30 units with meals - Sliding of Humalog - 150- 165: + 1 unit  - 166- 180: + 2 units  - 181- 195: + 3 units  - 196- 210: + 4 units  - 211- 225: + 5 units  - 226- 240: + 6 units  - 241- 255: + 7 units  - 256- 270: + 8 units  - 271- 285: + 9 units  - > 286: + 10 units   Do not skip doses.  Please come back in 3 months.

## 2016-03-12 NOTE — Addendum Note (Signed)
Addended by: Caprice Beaver T on: 03/12/2016 04:34 PM   Modules accepted: Orders

## 2016-03-12 NOTE — Progress Notes (Signed)
Subjective:     Patient ID: Cassandra Morris, female   DOB: 04-13-1951, 65 y.o.   MRN: [id number]  HPI Cassandra Morris is a pleasant 65 y.o. woman, returning for f/u for DM2, dx 2011, insulin-dependent, uncontrolled, with complications (PN). Last visit 3 mo ago.  Pt has severe depression, which she has every summer >> cannot control her sugars well. She started Abilify.  She also had TMJ.  Lab Results  Component Value Date   HGBA1C 9.9 12/12/2015   HGBA1C 11.1* 09/02/2015   HGBA1C 8.8* 02/24/2015   She is now on a regimen of: Lantus 40 >> 50 units at bedtime Humalog: 15-30 units with meals SSI of Humalog - 150- 165: + 1 unit  - 166- 180: + 2 units  - 181- 195: + 3 units  - 196- 210: + 4 units  - 211- 225: + 5 units  - 226- 240: + 6 units  - 241- 255: + 7 units  - 256- 270: + 8 units  - 271- 285: + 9 units  - > 286: + 10 units  She stopped Metformin XR 500 mg 2x a day >> diarrhea - stopped. She mentions that she does take her insulin doses consistently along with her metformin.  She checks her sugars several times a day - 2x a day >> reviewed log: - am: 180-190 >> 180 >> 186-220, 252 >> 110-150 >> 300-400s >> 171-381 >> 274-452 - 2h after b'fast: 170-200 >> not checking >> 150-170 >> 223-209, 310 >> 127-190s, 237 >> n/c >> 210, 273 >> 362-464 - prelunch: 90-190 >> 140s >> 170s >> 150s >> 151, 202-290 >> 117-193, 217 >> n/c >> 104-259, 382 >> 339, 452 - 2h postlunch: 99-191 >> 170-200 >> 200s >> 180-200 >> 126-175 >> 62, 92-178 >> 250-350 >> 130-289 >> 99 - predinner: 90-149 >> 90-185 >> 200s >> 150-180 >> 89, 182 >> 79-155, 170 >> n/c >> 98-295 >> 246 - 2h post dinner: 118-168 >> not checking >> 200 >> 285 >> 123-168 >> n/c >> 88 - bedtime: 135-187 >> not checking >> 200 (grazes) >> 242 >> 144-195 >> n/c >> 122, 254 >> n/c Lows: 66 x1. Highest: 500.  Glucometer: OneTouch Verio  - No CKD: Lab Results  Component Value Date   TSH 2.26 09/02/2015   CREATININE 1.15 09/02/2015  -  No neuropathy.  - no HL: Lab Results  Component Value Date   CHOL 228* 09/02/2015   HDL 38.70* 09/02/2015   LDLCALC 61 07/16/2014   LDLDIRECT 151.0 09/02/2015   TRIG 288.0* 09/02/2015   CHOLHDL 6 09/02/2015  - She had an eye exam in Summer 2016. Dr Katy Fitch. She has a h/o iritis. Had cataract sx.   She also has a medical history of hypertension, hyperlipidemia, obesity.She saw a psychiatrist in 11/2013 >> Depakote and Lamictal >> she tapered these off as she felt terrible on these >> now having another psychiatrist >> Zoloft and Xanax.  I reviewed pt's medications, allergies, PMH, social hx, family hx, and changes were documented in the history of present illness. Otherwise, unchanged from my initial visit note.  Review of Systems Constitutional: no weight gain, + fatigue, + subjective hyperthermia, + nocturia Eyes: no blurry vision, no xerophthalmia ENT: no sore throat, no nodules palpated in throat, no dysphagia/odynophagia, + hoarseness Cardiovascular: no CP/SOB/palpitations/leg swelling Respiratory: no cough/no SOB Gastrointestinal: no N/VD/C Musculoskeletal: no muscle aches/no joint aches Neuro: no HAs, + tremors - + depression  Objective:   Physical  Exam BP 142/82 mmHg  Pulse 82  Ht 5\' 8"  (1.727 m)  Wt 204 lb (92.534 kg)  BMI 31.03 kg/m2  SpO2 95% Body mass index is 31.03 kg/(m^2). Wt Readings from Last 3 Encounters:  03/12/16 204 lb (92.534 kg)  03/01/16 205 lb (92.987 kg)  01/02/16 209 lb (94.802 kg)  Constitutional: overweight, in NAD Eyes: unequal pupils L>R - surgical R pupil, EOMI, no exophthalmos ENT: moist mucous membranes, no thyromegaly, no cervical lymphadenopathy Cardiovascular: RRR, No MRG Respiratory: CTA B Gastrointestinal: abdomen soft, NT, ND, BS+ Musculoskeletal: no deformities, strength intact in all 4 Neuro:  + mild tremor with outstretched hands Skin: moist, warm, no rashes  Assessment:     1. DM2, insulin-dependent, uncontrolled, with  complications - PN     Plan:     Patient with depression which greatly influences her DM control. Sugars are very high as she she is not compliant with her insulin doses. She started Abilify and feels a little better >> hopefully she can get back on tract with checking sugars and bolusing. Until then, no changes in her insulin doses can be made. -  We need to stay off Metformin, unfortunately, 2/2 diarrhea. - I advised her to: Patient Instructions  Please continue: - Lantus 50 units at bedtime - Humalog: 15-30 units with meals - Sliding of Humalog - 150- 165: + 1 unit  - 166- 180: + 2 units  - 181- 195: + 3 units  - 196- 210: + 4 units  - 211- 225: + 5 units  - 226- 240: + 6 units  - 241- 255: + 7 units  - 256- 270: + 8 units  - 271- 285: + 9 units  - > 286: + 10 units   Do not skip doses.  Please come back in 3 months.  - checked HbA1c now >> 10.3% (worse) - I will see the patient back in 3 months with her sugar log

## 2016-03-13 ENCOUNTER — Encounter: Payer: Self-pay | Admitting: *Deleted

## 2016-03-13 NOTE — Patient Outreach (Signed)
Lois has not been seen for Link To Wellness follow up since 06/28/15. She did not respond to an e- mail sent to her [facility] e-mail address on 6/28 requesting that she e-mail this RNCM dates and times that she can meet in August. A letter will be mailed to her today advising her to schedule a follow up appointment within 10 days of receiving the letter or risk termination from the program. Barrington Ellison RN,CCM,CDE [place] Management Coordinator Link To Wellness Office Phone [phone] Office Fax [phone]

## 2016-03-28 MED FILL — SERTRALINE HCL 100 MG TAB: 100 | 90 days supply | Qty: 180 | Fill #2

## 2016-03-28 MED FILL — TRUE METRIX GLUCOSE TEST ST: 30 days supply | Qty: 100 | Fill #3

## 2016-03-29 ENCOUNTER — Ambulatory Visit: Payer: Self-pay | Admitting: *Deleted

## 2016-03-30 ENCOUNTER — Other Ambulatory Visit: Payer: Self-pay | Admitting: *Deleted

## 2016-03-30 ENCOUNTER — Encounter: Payer: Self-pay | Admitting: Internal Medicine

## 2016-03-30 NOTE — Telephone Encounter (Signed)
Rec'd fax frpm [facility] pharmacy requesting refill on pt Humalog. Per  Chart refill already been done pt see Dr. Renne Crigler for her diabetes...Cassandra Morris

## 2016-03-30 NOTE — Telephone Encounter (Signed)
rx Done hardcopy to [facility]

## 2016-03-30 NOTE — Telephone Encounter (Signed)
Cassandra Morris - to let me know if an order is needed for use of [employer] card for [facility] massage treatment

## 2016-04-03 ENCOUNTER — Telehealth: Payer: Self-pay

## 2016-04-03 NOTE — Telephone Encounter (Signed)
Called and spoke to patient, patient advised that this needed to be faxed to a number she did not know of, she gave me a number to call to see where I needed to fax it to and they advised that the patient is to keep the order with her to show to [facility] when she is getting the massages done so that she is able to pay with her benny card. Patient is coming to pick up order

## 2016-04-25 MED FILL — TRUE METRIX GLUCOSE TEST ST: 30 days supply | Qty: 100 | Fill #4

## 2016-04-25 MED FILL — LANTUS SOLOSTAR 100 UNITS/M: 100 | 75 days supply | Qty: 30 | Fill #2

## 2016-04-26 ENCOUNTER — Encounter: Payer: Self-pay | Admitting: Internal Medicine

## 2016-04-27 ENCOUNTER — Other Ambulatory Visit: Payer: Self-pay | Admitting: Internal Medicine

## 2016-04-27 MED FILL — HUMALOG 100 UNITS/ML KWIKPE: 100 | 30 days supply | Qty: 15 | Fill #0

## 2016-05-22 ENCOUNTER — Other Ambulatory Visit: Payer: Self-pay | Admitting: *Deleted

## 2016-05-22 ENCOUNTER — Encounter: Payer: Self-pay | Admitting: *Deleted

## 2016-05-22 NOTE — Patient Outreach (Addendum)
[facility]) Care Management   05/22/16  Cassandra Morris [birth year] [id number]  Cassandra Morris is an 65 y.o. female who presents to the [place] Management office for routine Link To Wellness follow up for self management assistance with Type II DM, HTN and hyperlipidemia.  Subjective: Cassandra Morris says she is now "back on track" and following a low CHO meal. plan. She says she is occasionally checking her blood sugars and when she doesn't feel well. She reports values from 200 to 86 in the last month.She continues to prefer the use of herbs and supplements to treat her chronic disease states.    Objective:   Review of Systems  Constitutional: Negative.     Physical Exam  Constitutional: She is oriented to person, place, and time. She appears well-developed and well-nourished.  Respiratory: Effort normal.  Neurological: She is alert and oriented to person, place, and time.  Skin: Skin is warm and dry.  Psychiatric: She has a normal mood and affect. Her behavior is normal. Judgment and thought content normal.   Filed Weights   05/22/16 1654  Weight: 210 lb 6.4 oz (95.4 kg)   Vitals:   05/22/16 1654  BP: 128/74   Outpatient Encounter Prescriptions as of 06/28/2015  Medication Sig Note  . Adenosine Phosphate (ADENOSINE-5-MONOPHOSPHATE) POWD 1 capsule by Does not apply route. 06/29/2015: She calls it AMPK  . ALPRAZolam (XANAX) 0.25 MG tablet Take 0.25 mg by mouth daily. 06/28/2015: Takes in the morning  . Cinnamon 500 MG capsule Take 500 mg by mouth daily.   . Cyanocobalamin (VITAMIN B-12) 2500 MCG SUBL Place 2 each under the tongue daily.   01/06/2015: As needed  . Insulin Glargine (LANTUS SOLOSTAR) 100 UNIT/ML Solostar Pen Inject 40 Units into the skin daily at 10 pm. 06/28/2015: Taking 50 units daily  . losartan (COZAAR) 50 MG tablet Take 50 mg by mouth daily.   . pantoprazole (PROTONIX) 40 MG tablet Take 1 tablet (40 mg total) by mouth 2 (two)  times daily before a meal.   . PINE BARK, PYCNOGENOL, PO Take 1 capsule by mouth. 06/29/2015: Takes isotonic OPCV which also contains bilberry   . sertraline (ZOLOFT) 50 MG tablet Take 50 mg by mouth daily. Take 1/2 tablet daily for 7 days, then increase to 1 tablet daily. 01/06/2015: Takes 1 1/2 tablets  . TURMERIC PO Take 1 capsule by mouth daily.   Marland Kitchen UNIFINE PENTIPS 31G X 8 MM MISC USE 4 TIMES DAILY   . Cholecalciferol (VITAMIN D3) 400 UNITS CAPS Take 8 capsules by mouth.   Marland Kitchen HUMALOG KWIKPEN 100 UNIT/ML KiwkPen INJECT 10-15 UNITS 3 TIMES DAILY 15 MINUTES BEFORE MEALS   . HYDROcodone-homatropine (HYCODAN) 5-1.5 MG/5ML syrup Take 5 mLs by mouth every 6 (six) hours as needed for cough. (Patient not taking: Reported on 06/28/2015)   . Magnesium 250 MG TABS Take by mouth.   . montelukast (SINGULAIR) 10 MG tablet One at bedtime every night (Patient not taking: Reported on 06/28/2015) 06/28/2015: Not taking due to side effects of adding to depression   No facility-administered encounter medications on file as of 06/28/2015.    No current facility-administered medications for this visit.    Functional Status:   In your present state of health, do you have any difficulty performing the following activities: 06/28/2015  Hearing? N  Vision? N  Difficulty concentrating or making decisions? N  Walking or climbing stairs? N  Dressing or bathing? N  Doing errands,  shopping? 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  Plan:   [facility] CM Care Plan Problem One        Most Recent Value   Care Plan Problem One  Patient with Type II DM (IDDM) not meeting Hgb A1C target as evidenced by A1C= 10.3% on 03/12/16 (previous Hgb A1C was 9.9% on 12/12/15, with HTN and  hyperlipidemia not meeting treatment targets as evidenced by abnormal lipid profile on 09/02/15 showing elevated cholesterol, triglycerides and LDL., with obesity with current body mass index=   32.1- now following a CHO controlled meal plan   Role Documenting the Problem One  Care Management [place] for Problem One  Active   [facility] Long Term Goal (31-90 days)  Improved glycemic control as evidenced by Hgb A1C <8.5% at next assessment, improved lipid profile at next assessment and ongoing normal BP readings of <140/<90 and evidence of weight loss or no weight gain at the next assessment   [facility] Long Term Goal Start Date  05/22/16   Interventions for Problem One Long Term Goal  reviewed patient medications, discussed DM medications of Lantus and Humalog including the mechanism of action, common side effects, dosages and dosing schedule, onset and duration of action, reinforced importance of taking all medications as prescribed, reviewed how stress impacts blood sugar control and encouraged Cassandra Morris to do things she enjoys,  discussed changes to [employer] To Wellness program in 2018, arranged for Link To Wellness follow up in December      RNCM to fax today's office visit note to Dr. Jenny Reichmann and Dr. Cruzita Lederer. RNCM will meet quarterly and as needed with patient per Link To Wellness program guidelines to assist with Type II DM, hyperlipidemia, HTN and obesity self-management and assess patient's progress toward mutually set goals.  Barrington Ellison RN,CCM,CDE [place] Management Coordinator Link To Wellness Office Phone [phone] Office Fax [phone]

## 2016-06-05 DIAGNOSIS — H35373 Puckering of macula, bilateral: Secondary | ICD-10-CM | POA: Diagnosis not present

## 2016-06-05 DIAGNOSIS — D3132 Benign neoplasm of left choroid: Secondary | ICD-10-CM | POA: Diagnosis not present

## 2016-06-05 DIAGNOSIS — E119 Type 2 diabetes mellitus without complications: Secondary | ICD-10-CM | POA: Diagnosis not present

## 2016-06-05 DIAGNOSIS — Z961 Presence of intraocular lens: Secondary | ICD-10-CM | POA: Diagnosis not present

## 2016-06-05 LAB — HM DIABETES EYE EXAM

## 2016-06-12 ENCOUNTER — Ambulatory Visit (INDEPENDENT_AMBULATORY_CARE_PROVIDER_SITE_OTHER): Payer: 59 | Admitting: Internal Medicine

## 2016-06-12 ENCOUNTER — Encounter: Payer: Self-pay | Admitting: Internal Medicine

## 2016-06-12 VITALS — BP 138/78 | HR 87 | Ht 68.5 in | Wt 211.0 lb

## 2016-06-12 DIAGNOSIS — E1140 Type 2 diabetes mellitus with diabetic neuropathy, unspecified: Secondary | ICD-10-CM | POA: Diagnosis not present

## 2016-06-12 DIAGNOSIS — Z794 Long term (current) use of insulin: Secondary | ICD-10-CM | POA: Diagnosis not present

## 2016-06-12 LAB — POCT GLYCOSYLATED HEMOGLOBIN (HGB A1C): Hemoglobin A1C: 10.8

## 2016-06-12 MED ORDER — DULAGLUTIDE 1.5 MG/0.5ML ~~LOC~~ SOAJ: SUBCUTANEOUS | 5 refills | Status: DC

## 2016-06-12 MED ORDER — DULAGLUTIDE 0.75 MG/0.5ML ~~LOC~~ SOAJ: SUBCUTANEOUS | 0 refills | Status: DC

## 2016-06-12 MED FILL — TRULICITY 0.75 MG/0.5 ML PE: 0.75 | 28 days supply | Qty: 2 | Fill #0

## 2016-06-12 NOTE — Progress Notes (Signed)
Subjective:     Patient ID: Cassandra Morris, female   DOB: [birth year], 65 y.o.   MRN: [id number]  HPI Cassandra Morris is a pleasant 65 y.o. woman, returning for f/u for DM2, dx 2011, insulin-dependent, uncontrolled, with complications (PN). Last visit 3 mo ago.  Pt has severe depression, which she has every summer >> cannot control her sugars well. She started Abilify before last visit >> feeling a little better, but last 3 mo were still rough for her >> sleeps a lot, eats sweets.  Lab Results  Component Value Date   HGBA1C 10.3 03/12/2016   HGBA1C 9.9 12/12/2015   HGBA1C 11.1 (H) 09/02/2015   She is now on a regimen of: Lantus 50 units at bedtime Humalog: 15-30 units with meals SSI of Humalog - 150- 165: + 1 unit  - 166- 180: + 2 units  - 181- 195: + 3 units  - 196- 210: + 4 units  - 211- 225: + 5 units  - 226- 240: + 6 units  - 241- 255: + 7 units  - 256- 270: + 8 units  - 271- 285: + 9 units  - > 286: + 10 units  She stopped Metformin XR 500 mg 2x a day >> diarrhea - stopped. She mentions that she does take her insulin doses consistently along with her metformin. She was on   She checks her sugars several times a day - 2x a day >> reviewed log: - am: 180-190 >> 180 >> 186-220, 252 >> 110-150 >> 300-400s >> 171-381 >> 274-452 >>290-436 - 2h after b'fast: 150-170 >> 223-209, 310 >> 127-190s, 237 >> n/c >> 210, 273 >> 362-464 >> 211-425 - prelunch:170s >> 150s >> 151, 202-290 >> 117-193, 217 >> n/c >> 104-259, 382 >> 339, 452 >> 86, 185, 336 - 2h postlunch: 200s >> 180-200 >> 126-175 >> 62, 92-178 >> 250-350 >> 130-289 >> 99 >> 194-390 - predinner: 200s >> 150-180 >> 89, 182 >> 79-155, 170 >> n/c >> 98-295 >> 246 >> n/c - 2h post dinner: 118-168 >> not checking >> 200 >> 285 >> 123-168 >> n/c >> 88 >> n/c - bedtime: 135-187 >> not checking >> 200 (grazes) >> 242 >> 144-195 >> n/c >> 122, 254 >> n/c Lows: 66 x1 >> 86. Highest: 500 >> 400s.  Glucometer: OneTouch Verio  - No  CKD: Lab Results  Component Value Date   TSH 2.26 09/02/2015   CREATININE 1.15 09/02/2015  - No neuropathy.  - no HL: Lab Results  Component Value Date   CHOL 228 (H) 09/02/2015   HDL 38.70 (L) 09/02/2015   LDLCALC 61 07/16/2014   LDLDIRECT 151.0 09/02/2015   TRIG 288.0 (H) 09/02/2015   CHOLHDL 6 09/02/2015  - She had an eye exam in 05/2016. Dr Katy Fitch. She has a h/o iritis. Had cataract sx.   She also has a medical history of hypertension, hyperlipidemia, obesity.She saw a psychiatrist in 11/2013 >> Depakote and Lamictal >> she tapered these off as she felt terrible on these >> now having another psychiatrist >> Zoloft and Xanax.   I reviewed pt's medications, allergies, PMH, social hx, family hx, and changes were documented in the history of present illness. Otherwise, unchanged from my initial visit note.  Review of Systems Constitutional: no weight gain, + fatigue, + subjective hyperthermia, + nocturia Eyes: no blurry vision, no xerophthalmia ENT: no sore throat, no nodules palpated in throat, no dysphagia/odynophagia, + hoarseness Cardiovascular: no CP/SOB/palpitations/leg swelling Respiratory: no cough/no  SOB Gastrointestinal: no N/V/+D/no C Musculoskeletal: no muscle aches/no joint aches Neuro: no HAs, no tremors  Objective:   Physical Exam BP 138/78 (BP Location: Left Arm, Patient Position: Sitting)   Pulse 87   Ht 5' 8.5" (1.74 m)   Wt 211 lb (95.7 kg)   SpO2 97%   BMI 31.62 kg/m  Body mass index is 31.62 kg/m. Wt Readings from Last 3 Encounters:  06/12/16 211 lb (95.7 kg)  05/22/16 210 lb 6.4 oz (95.4 kg)  03/12/16 204 lb (92.5 kg)  Constitutional: overweight, in NAD Eyes: unequal pupils L>R - surgical R pupil, EOMI, no exophthalmos ENT: moist mucous membranes, no thyromegaly, no cervical lymphadenopathy Cardiovascular: RRR, No MRG Respiratory: CTA B Gastrointestinal: abdomen soft, NT, ND, BS+ Musculoskeletal: no deformities, strength intact in all 4 Neuro:   + mild tremor with outstretched hands Skin: moist, warm, no rashes  Assessment:     1. DM2, insulin-dependent, uncontrolled, with complications - PN     Plan:     Patient with depression which greatly influences her DM control. Sugars are again very high as she has many dietary indiscretions and is not completely compliant with her insulin doses. Will try to add Trulicuity. -  We need to stay off Metformin, unfortunately, 2/2 diarrhea. - I advised her to: Patient Instructions  Please continue: - Lantus 50 units at bedtime - Humalog: 15-30 units with meals - Sliding of Humalog - 150- 165: + 1 unit  - 166- 180: + 2 units  - 181- 195: + 3 units  - 196- 210: + 4 units  - 211- 225: + 5 units  - 226- 240: + 6 units  - 241- 255: + 7 units  - 256- 270: + 8 units  - 271- 285: + 9 units  - > 286: + 10 units   Do not skip doses.  Please add Trulicity [id number] mg weekly x 4 weeks, then increase to 1.5 mg weekly.  Please come back in 1.5 months.  - checked HbA1c now >> 10.8% (worse) - I will see the patient back in 1.5 months with her sugar log   Philemon Kingdom, MD PhD [facility] Endocrinology

## 2016-06-12 NOTE — Patient Instructions (Addendum)
Please continue: - Lantus 50 units at bedtime - Humalog: 15-30 units with meals - Sliding of Humalog - 150- 165: + 1 unit  - 166- 180: + 2 units  - 181- 195: + 3 units  - 196- 210: + 4 units  - 211- 225: + 5 units  - 226- 240: + 6 units  - 241- 255: + 7 units  - 256- 270: + 8 units  - 271- 285: + 9 units  - > 286: + 10 units   Do not skip doses.  Please add Trulicity [id number] mg weekly x 4 weeks, then increase to 1.5 mg weekly.  Please come back in 1.5 months.

## 2016-06-21 MED FILL — ARIPiprazole 5 MG TABS: 5 | 90 days supply | Qty: 45 | Fill #1

## 2016-07-09 ENCOUNTER — Telehealth: Payer: 59 | Admitting: Family

## 2016-07-09 DIAGNOSIS — J329 Chronic sinusitis, unspecified: Secondary | ICD-10-CM | POA: Diagnosis not present

## 2016-07-09 DIAGNOSIS — B9689 Other specified bacterial agents as the cause of diseases classified elsewhere: Secondary | ICD-10-CM | POA: Diagnosis not present

## 2016-07-09 MED ORDER — AMOXICILLIN-POT CLAVULANATE 875-125 MG PO TABS: 1.0000 | ORAL_TABLET | Freq: Two times a day (BID) | ORAL | 0 refills | Status: AC

## 2016-07-09 MED FILL — HUMALOG 100 UNITS/ML KWIKPE: 100 | 30 days supply | Qty: 15 | Fill #1

## 2016-07-09 MED FILL — TRULICITY 1.5 MG/0.5 ML PEN: 1.5 | 28 days supply | Qty: 2 | Fill #0

## 2016-07-09 MED FILL — TRUE METRIX GLUCOSE TEST ST: 30 days supply | Qty: 100 | Fill #5

## 2016-07-09 MED FILL — AMOX-CLAV 875-125 MG TABLET: 875-125 | 7 days supply | Qty: 14 | Fill #0

## 2016-07-09 NOTE — Progress Notes (Signed)

## 2016-07-13 ENCOUNTER — Other Ambulatory Visit: Payer: Self-pay

## 2016-07-13 MED ORDER — INSULIN GLARGINE 100 UNIT/ML SOLOSTAR PEN: 50.0000 [IU] | PEN_INJECTOR | Freq: Every day | SUBCUTANEOUS | 2 refills | Status: DC

## 2016-07-13 MED FILL — LANTUS SOLOSTAR 100 UNITS/M: 100 | 60 days supply | Qty: 30 | Fill #0

## 2016-07-27 ENCOUNTER — Other Ambulatory Visit: Payer: Self-pay

## 2016-07-27 ENCOUNTER — Encounter: Payer: Self-pay | Admitting: Internal Medicine

## 2016-07-27 MED ORDER — DULAGLUTIDE 0.75 MG/0.5ML ~~LOC~~ SOAJ: SUBCUTANEOUS | 1 refills | Status: DC

## 2016-07-27 MED FILL — TRULICITY 0.75 MG/0.5 ML PE: 0.75 | 28 days supply | Qty: 2 | Fill #0

## 2016-08-06 ENCOUNTER — Encounter: Payer: Self-pay | Admitting: Internal Medicine

## 2016-08-06 ENCOUNTER — Ambulatory Visit (INDEPENDENT_AMBULATORY_CARE_PROVIDER_SITE_OTHER): Payer: 59 | Admitting: Internal Medicine

## 2016-08-06 VITALS — BP 126/82 | HR 99 | Ht 68.5 in | Wt 212.8 lb

## 2016-08-06 DIAGNOSIS — Z794 Long term (current) use of insulin: Secondary | ICD-10-CM | POA: Diagnosis not present

## 2016-08-06 DIAGNOSIS — E1140 Type 2 diabetes mellitus with diabetic neuropathy, unspecified: Secondary | ICD-10-CM | POA: Diagnosis not present

## 2016-08-06 NOTE — Patient Instructions (Addendum)
Please try to move back Lantus 40 units to after dinner.  Please continue:  Trulicity [id number] mcg daily Humalog:  8-30 units with meals SSI of Humalog - 150- 165: + 1 unit  - 166- 180: + 2 units  - 181- 195: + 3 units  - 196- 210: + 4 units  - 211- 225: + 5 units  - 226- 240: + 6 units  - 241- 255: + 7 units  - 256- 270: + 8 units  - 271- 285: + 9 units  - > 286: + 10 units   Please return in 1.5 months with your sugar log.

## 2016-08-06 NOTE — Progress Notes (Signed)
Subjective:     Patient ID: Cassandra Morris, female   DOB: 01-16-1951, 65 y.o.   MRN: [id number]  HPI Cassandra Morris is a pleasant 65 y.o. woman, returning for f/u for DM2, dx 2011, insulin-dependent, uncontrolled, with complications (PN). Last visit 3 mo ago.  Pt has severe depression, which she has every summer >> cannot control her sugars well. Therefore, at last visit, her sugars were much higher than before. However, in last 3 months, her sugars have improved significantly.  Lab Results  Component Value Date   HGBA1C 10.8 06/12/2016   HGBA1C 10.3 03/12/2016   HGBA1C 9.9 12/12/2015   She is now on a regimen of: Lantus 50 >> 40 units hs >> in am Humalog: 15-30 units with meals SSI of Humalog - 150- 165: + 1 unit  - 166- 180: + 2 units  - 181- 195: + 3 units  - 196- 210: + 4 units  - 211- 225: + 5 units  - 226- 240: + 6 units  - 241- 255: + 7 units  - 256- 270: + 8 units  - 271- 285: + 9 units  - > 286: + 10 units  Trulicity A999333 g weekly. We tried to increase the dose to 1.5 but she had increased nausea, so we had to stay on the lower dose. She stopped Metformin XR 500 mg 2x a day >> diarrhea - stopped. She mentions that she does take her insulin doses consistently along with her metformin. She was on   She checks her sugars several times a day - 2x a day >> reviewed her new log: - am:  110-150 >> 300-400s >> 171-381 >> 274-452 >>290-436 >> 97-211, 247 - 2h after b'fast: 1127-190s, 237 >> n/c >> 210, 273 >> 362-464 >> 211-425 >> 95-199, 220 - prelunch:117-193, 217 >> n/c >> 104-259, 382 >> 339, 452 >> 86, 185, 336 >>  80-223 - 2h postlunch 62, 92-178 >> 250-350 >> 130-289 >> 99 >> 194-390 >> 123-216 - predinner: 89, 182 >> 79-155, 170 >> n/c >> 98-295 >> 246 >> n/c >> 80-134, 145 - 2h post dinner:  200 >> 285 >> 123-168 >> n/c >> 88 >> n/c - bedtime:  242 >> 144-195 >> n/c >> 122, 254 >> n/c Lows: 66 x1 >> 86. Highest: 500 >> 400s >> 2002.  Glucometer: OneTouch Verio  - No  CKD: Lab Results  Component Value Date   TSH 2.26 09/02/2015   CREATININE 1.15 09/02/2015  - No neuropathy.  - no HL: Lab Results  Component Value Date   CHOL 228 (H) 09/02/2015   HDL 38.70 (L) 09/02/2015   LDLCALC 61 07/16/2014   LDLDIRECT 151.0 09/02/2015   TRIG 288.0 (H) 09/02/2015   CHOLHDL 6 09/02/2015  - She had an eye exam in 05/2016. Dr Katy Fitch. She has a h/o iritis. Had cataract sx.   She also has a medical history of hypertension, hyperlipidemia, obesity. As mentioned above, she has severe depression. She saw a psychiatrist in 11/2013 >> Depakote and Lamictal >> she tapered these off as she felt terrible on these >> now having another psychiatrist >> Zoloft and Xanax.   I reviewed pt's medications, allergies, PMH, social hx, family hx, and changes were documented in the history of present illness. Otherwise, unchanged from my initial visit note.  Review of Systems Constitutional: no weight gain, + fatigue, no subjective hyperthermia, + nocturia Eyes: no blurry vision, no xerophthalmia ENT: no sore throat, no nodules palpated in throat,  no dysphagia/odynophagia no hoarseness Cardiovascular: no CP/SOB/palpitations/leg swelling Respiratory: no cough/no SOB Gastrointestinal: no N/V/D/C Musculoskeletal: no muscle aches/no joint aches Neuro: no HAs, no tremors  Objective:   Physical Exam BP 126/82 (BP Location: Left Arm, Patient Position: Sitting, Cuff Size: Large)   Pulse 99   Ht 5' 8.5" (1.74 m)   Wt 212 lb 12.8 oz (96.5 kg)   SpO2 96%   BMI 31.89 kg/m  Body mass index is 31.89 kg/m. Wt Readings from Last 3 Encounters:  08/06/16 212 lb 12.8 oz (96.5 kg)  06/12/16 211 lb (95.7 kg)  05/22/16 210 lb 6.4 oz (95.4 kg)  Constitutional: overweight, in NAD Eyes: unequal pupils L>R - surgical R pupil, EOMI, no exophthalmos ENT: moist mucous membranes, no thyromegaly, no cervical lymphadenopathy Cardiovascular: RRR, No MRG Respiratory: CTA B Gastrointestinal: abdomen  soft, NT, ND, BS+ Musculoskeletal: no deformities, strength intact in all 4 Neuro:  No tremor with outstretched hands Skin: moist, warm, no rashes  Assessment:     1. DM2, insulin-dependent, uncontrolled, with complications - PN     Plan:     Patient with improvement in her sugars in the last 3 months due to change in season (she is always depressed during the summer) and addition of Trulicity. She could not tolerate the higher dose of Trulicity, but I advised her to try to increase it again and let me know if she can tolerate it better so I can send a new prescription to her pharmacy. For now, since her sugars are higher in the morning and she has moved Lantus in a.m., I advised her to move the Lantus back at night. - We need to stay off Metformin, unfortunately, 2/2 diarrhea. - last HbA1c was 10.8% - I advised her to: Patient Instructions  Please try to move back Lantus 40 units to after dinner.  Please continue:  Trulicity [id number] mcg daily Humalog:  8-30 units with meals SSI of Humalog - 150- 165: + 1 unit  - 166- 180: + 2 units  - 181- 195: + 3 units  - 196- 210: + 4 units  - 211- 225: + 5 units  - 226- 240: + 6 units  - 241- 255: + 7 units  - 256- 270: + 8 units  - 271- 285: + 9 units  - > 286: + 10 units   Please return in 1.5 months with your sugar log.   - She is up-to-date with her eye exams - Return in about 6 weeks (around 09/17/2016).  Philemon Kingdom, MD PhD [facility] Endocrinology

## 2016-08-07 ENCOUNTER — Ambulatory Visit: Payer: 59 | Admitting: *Deleted

## 2016-08-15 ENCOUNTER — Other Ambulatory Visit: Payer: Self-pay | Admitting: *Deleted

## 2016-08-15 NOTE — Patient Outreach (Signed)
Cassandra Morris) Care Management   08/15/16  Cassandra Morris 04-24-1951 [id number]  Cassandra Morris is an 65 y.o. female who presents to the [facility] office for routine Link To Wellness follow up for self management assistance with Type II DM, HTN and hyperlipidemia.  Subjective: Cassandra Morris says she is not exercising and not consistently following a CHO controlled meal plan. She says she is occasionally checking her blood sugars and when she doesn't feel well. She says she is not taking the prescribed statin because of the potential side effects.   Objective:   Review of Systems  Constitutional: Negative.     Physical Exam  Constitutional: She is oriented to person, place, and time. She appears well-developed and well-nourished.  Respiratory: Effort normal.  Neurological: She is alert and oriented to person, place, and time.  Skin: Skin is warm and dry.  Psychiatric: She has a normal mood and affect. Her behavior is normal. Judgment and thought content normal.   Filed Weights   08/15/16 1605  Weight: 214 lb 12.8 oz (97.4 kg)   Vitals:   08/15/16 1605  BP: 115/70   Outpatient Encounter Prescriptions as of 06/28/2015  Medication Sig Note  . Adenosine Phosphate (ADENOSINE-5-MONOPHOSPHATE) POWD 1 capsule by Does not apply route. 06/29/2015: She calls it AMPK  . ALPRAZolam (XANAX) 0.25 MG tablet Take 0.25 mg by mouth daily. 06/28/2015: Takes in the morning  . Cinnamon 500 MG capsule Take 500 mg by mouth daily.   . Cyanocobalamin (VITAMIN B-12) 2500 MCG SUBL Place 2 each under the tongue daily.   01/06/2015: As needed  . Insulin Glargine (LANTUS SOLOSTAR) 100 UNIT/ML Solostar Pen Inject 40 Units into the skin daily at 10 pm. 06/28/2015: Taking 50 units daily  . losartan (COZAAR) 50 MG tablet Take 50 mg by mouth daily.   . pantoprazole (PROTONIX) 40 MG tablet Take 1 tablet (40 mg total) by mouth 2 (two) times daily before a meal.   . PINE  BARK, PYCNOGENOL, PO Take 1 capsule by mouth. 06/29/2015: Takes isotonic OPCV which also contains bilberry   . sertraline (ZOLOFT) 50 MG tablet Take 50 mg by mouth daily. Take 1/2 tablet daily for 7 days, then increase to 1 tablet daily. 01/06/2015: Takes 1 1/2 tablets  . TURMERIC PO Take 1 capsule by mouth daily.   Marland Kitchen UNIFINE PENTIPS 31G X 8 MM MISC USE 4 TIMES DAILY   . Cholecalciferol (VITAMIN D3) 400 UNITS CAPS Take 8 capsules by mouth.   Marland Kitchen HUMALOG KWIKPEN 100 UNIT/ML KiwkPen INJECT 10-15 UNITS 3 TIMES DAILY 15 MINUTES BEFORE MEALS   . HYDROcodone-homatropine (HYCODAN) 5-1.5 MG/5ML syrup Take 5 mLs by mouth every 6 (six) hours as needed for cough. (Patient not taking: Reported on 06/28/2015)   . Magnesium 250 MG TABS Take by mouth.   . montelukast (SINGULAIR) 10 MG tablet One at bedtime every night (Patient not taking: Reported on 06/28/2015) 06/28/2015: Not taking due to side effects of adding to depression   No facility-administered encounter medications on file as of 06/28/2015.    No current facility-administered medications for this visit.    Functional Status:   No flowsheet data found.  Fall/Depression Screening:    PHQ 2/9 Scores 03/02/2015 01/06/2015  PHQ - 2 Score 0 1    Assessment:   [employer] employee and Link To Wellness member with HTN, hyperlipidemia and Type II DM, not meeting treatment targets for DM and hyperlipidemia.  Plan:  [facility] CM Care Plan Problem One        Most Recent Value   Care Plan Problem One  Patient with Type II DM (IDDM) not meeting Hgb A1C target as evidenced by Hgb A1C= 10.8% on 06/12/16,  was 10.3% on 03/12/16 (previous Hgb A1C was 9.9% on 12/12/15, with HTN and hyperlipidemia not meeting treatment targets as evidenced by abnormal lipid profile on 09/02/15 showing elevated cholesterol, triglycerides and LDL., with obesity with current body mass index=   32.7   Role Documenting the Problem One  Care Management [place] for Problem One  Active    [facility] Long Term Goal (31-90 days)  Improved glycemic control as evidenced by Hgb A1C <8.5% at next assessment, improved lipid profile at next assessment and ongoing normal BP readings of <130/<80 and evidence of weight loss or no weight gain at the next assessment   [facility] Long Term Goal Start Date  08/15/16   Interventions for Problem One Long Term Goal  reviewed patient medications, discussed DM medications of Lantus and Humalog including the mechanism of action, common side effects, dosages and dosing schedule, onset and duration of action, reinforced importance of taking all medications as prescribed, discussed [facility] and enrolled [place] in the program, clarified that ongoing disease management will occur via [facility] in 2018      RNCM to fax today's office visit note to Dr. Jenny Reichmann and Dr. Cruzita Lederer.   Barrington Ellison RN,CCM,CDE [place] Management Coordinator Link To Wellness Office Phone [phone] Office Fax [phone]

## 2016-09-13 ENCOUNTER — Encounter: Payer: Self-pay | Admitting: Internal Medicine

## 2016-09-24 ENCOUNTER — Encounter: Payer: Self-pay | Admitting: Internal Medicine

## 2016-09-24 ENCOUNTER — Ambulatory Visit (INDEPENDENT_AMBULATORY_CARE_PROVIDER_SITE_OTHER): Payer: 59 | Admitting: Internal Medicine

## 2016-09-24 VITALS — BP 132/78 | HR 88 | Ht 68.0 in | Wt 214.0 lb

## 2016-09-24 DIAGNOSIS — Z794 Long term (current) use of insulin: Secondary | ICD-10-CM | POA: Diagnosis not present

## 2016-09-24 DIAGNOSIS — E1140 Type 2 diabetes mellitus with diabetic neuropathy, unspecified: Secondary | ICD-10-CM

## 2016-09-24 LAB — POCT GLYCOSYLATED HEMOGLOBIN (HGB A1C): Hemoglobin A1C: 7.6

## 2016-09-24 NOTE — Progress Notes (Signed)
Subjective:     Patient ID: Cassandra Morris, female   DOB: October 20, 1950, 66 y.o.   MRN: [id number]  HPI Cassandra Morris is a pleasant 66 y.o. woman, returning for f/u for DM2, dx 2011, insulin-dependent, uncontrolled, with complications (PN). Last visit 3 mo ago.  She is on [facility] pgm >> doing much better.  Lab Results  Component Value Date   HGBA1C 10.8 06/12/2016   HGBA1C 10.3 03/12/2016   HGBA1C 9.9 12/12/2015   HGBA1C 11.1 (H) 09/02/2015   HGBA1C 8.8 (H) 02/24/2015   HGBA1C 8.8 (H) 11/29/2014   HGBA1C 7.4 (H) 07/28/2014   HGBA1C 12.0 (H) 04/20/2014   HGBA1C 8.3 (H) 05/05/2013   HGBA1C 11.9 (H) 10/24/2012   HGBA1C 10.0 (H) 09/26/2010   HGBA1C 8.4 (H) 03/16/2010   HGBA1C 6.5 11/29/2008   HGBA1C 6.4 (H) 08/09/2008   HGBA1C 6.5 (H) 03/29/2008   She is now on a regimen of: Lantus 50 >> 40 >> 50 units after dinner Humalog: 8-15 units with meals SSI of Humalog (not using much) - 150- 165: + 1 unit  - 166- 180: + 2 units  - 181- 195: + 3 units  - 196- 210: + 4 units  - 211- 225: + 5 units  - 226- 240: + 6 units  - 241- 255: + 7 units  - 256- 270: + 8 units  - 271- 285: + 9 units  - > 286: + 10 units  Trulicity [id number] mg weekly >> 1.5 mg >> Nausea >> stopped 2 weeks ago. She stopped Metformin XR 500 mg 2x a day >> diarrhea - stopped. She mentions that she does take her insulin doses consistently along with her metformin. She was on   She checks her sugars several times a day - 2x a day - no log, no meter: - am:  110-150 >> 300-400s >> 171-381 >> 274-452 >>290-436 >> 97-211, 247 >> 120-180 - 2h after b'fast: 127-190s, 237 >> n/c >> 210, 273 >> 362-464 >> 211-425 >> 95-199, 220 >> n/c - prelunch:117-193, 217 >> n/c >> 104-259, 382 >> 339, 452 >> 86, 185, 336 >>  80-223 >> 130-180 - 2h postlunch 62, 92-178 >> 250-350 >> 130-289 >> 99 >> 194-390 >> 123-216 >> n/c - predinner: 89, 182 >> 79-155, 170 >> n/c >> 98-295 >> 246 >> n/c >> 80-134, 145 >> 120-150 - 2h post dinner:  200 >>  285 >> 123-168 >> n/c >> 88 >> n/c - bedtime:  242 >> 144-195 >> n/c >> 122, 254 >> n/c Lows: 66 x1 >> 86 >> 98. Highest: 500 >> 400s >> 202 >> <200  Glucometer: OneTouch Verio  - No CKD: Lab Results  Component Value Date   TSH 2.26 09/02/2015   CREATININE 1.15 09/02/2015  - No neuropathy.  - no HL: Lab Results  Component Value Date   CHOL 228 (H) 09/02/2015   HDL 38.70 (L) 09/02/2015   LDLCALC 61 07/16/2014   LDLDIRECT 151.0 09/02/2015   TRIG 288.0 (H) 09/02/2015   CHOLHDL 6 09/02/2015  - She had an eye exam in 05/2016. Dr Katy Fitch. She has a h/o iritis. Had cataract sx.   She also has a medical history of hypertension, hyperlipidemia, obesity. As mentioned above, she has severe depression. Pt has severe depression, which she has every summer >> cannot control her sugars well.  She saw a psychiatrist in 11/2013 >> Depakote and Lamictal >> she tapered these off as she felt terrible on these >> now having  another psychiatrist >> Zoloft and Xanax.   I reviewed pt's medications, allergies, PMH, social hx, family hx, and changes were documented in the history of present illness. Otherwise, unchanged from my initial visit note.  Review of Systems Constitutional: no weight gain, no  fatigue, no subjective hyperthermia, no nocturia Eyes: no blurry vision, no xerophthalmia ENT: no sore throat, no nodules palpated in throat, no dysphagia/odynophagia no hoarseness Cardiovascular: no CP/SOB/palpitations/leg swelling Respiratory: no cough/no SOB Gastrointestinal: no N/V/D/C Musculoskeletal: no muscle aches/no joint aches Neuro: no HAs, no tremors  Objective:   Physical Exam BP 132/78 (BP Location: Left Arm, Patient Position: Sitting)   Pulse 88   Ht 5\' 8"  (1.727 m)   Wt 214 lb (97.1 kg)   SpO2 97%   BMI 32.54 kg/m  Body mass index is 32.54 kg/m. Wt Readings from Last 3 Encounters:  09/24/16 214 lb (97.1 kg)  08/15/16 214 lb 12.8 oz (97.4 kg)  08/06/16 212 lb 12.8 oz (96.5 kg)   Constitutional: overweight, in NAD Eyes: unequal pupils L>R - surgical R pupil, EOMI, no exophthalmos ENT: moist mucous membranes, no thyromegaly, no cervical lymphadenopathy Cardiovascular: RRR, No MRG Respiratory: CTA B Gastrointestinal: abdomen soft, NT, ND, BS+ Musculoskeletal: no deformities, strength intact in all 4 Neuro:  No tremor with outstretched hands Skin: moist, warm, no rashes  Assessment:     1. DM2, insulin-dependent, uncontrolled, with complications - PN     Plan:     Patient with improvement in her sugars due to change in season (she is always depressed during the summer) and now being in the [facility] pgm. Her sugars are still high before meals >> advised her to increase her mealtime insulin w/in the range she already has.  - We need to stay off Metformin, unfortunately, 2/2 diarrhea. - last HbA1c was 10.8% >> now much better: 7.6%. I congratulated her! - I advised her to: Patient Instructions  Please continue:  Lantus 40 units after dinner Humalog:  8-30 units with meals SSI of Humalog - 150- 165: + 1 unit  - 166- 180: + 2 units  - 181- 195: + 3 units  - 196- 210: + 4 units  - 211- 225: + 5 units  - 226- 240: + 6 units  - 241- 255: + 7 units  - 256- 270: + 8 units  - 271- 285: + 9 units  - > 286: + 10 units   Please return in 3 months with your sugar log.   - She is up-to-date with her eye exams - RTC in 3 mo  Philemon Kingdom, MD PhD [facility] Endocrinology

## 2016-09-24 NOTE — Addendum Note (Signed)
Addended by: Caprice Beaver T on: 09/24/2016 04:44 PM   Modules accepted: Orders

## 2016-09-24 NOTE — Patient Instructions (Addendum)
Patient Instructions  Please continue:  - Lantus 50 units after dinner - Humalog: 8-15 units with meals - SSI of Humalog - 150- 165: + 1 unit  - 166- 180: + 2 units  - 181- 195: + 3 units  - 196- 210: + 4 units  - 211- 225: + 5 units  - 226- 240: + 6 units  - 241- 255: + 7 units  - 256- 270: + 8 units  - 271- 285: + 9 units  - > 286: + 10 units   Please return in 3 months with your sugar log.

## 2016-10-08 ENCOUNTER — Other Ambulatory Visit: Payer: Self-pay | Admitting: Internal Medicine

## 2016-10-08 MED FILL — HUMALOG 100 UNITS/ML KWIKPE: 100 | 30 days supply | Qty: 15 | Fill #2

## 2016-10-08 MED FILL — SERTRALINE HCL 100 MG TAB: 100 | 90 days supply | Qty: 180 | Fill #0

## 2016-10-08 MED FILL — ARIPiprazole 5 MG TABS: 5 | 90 days supply | Qty: 45 | Fill #2

## 2016-10-08 MED FILL — LANTUS SOLOSTAR 100 UNITS/M: 100 | 60 days supply | Qty: 30 | Fill #1

## 2016-10-10 ENCOUNTER — Other Ambulatory Visit: Payer: Self-pay

## 2016-10-10 MED ORDER — ACCU-CHEK GUIDE W/DEVICE KIT: 1.0000 | PACK | Freq: Every day | 0 refills | Status: AC

## 2016-10-10 MED ORDER — ACCU-CHEK MULTICLIX LANCETS MISC: 5 refills | Status: DC

## 2016-10-10 MED ORDER — GLUCOSE BLOOD VI STRP: ORAL_STRIP | 5 refills | Status: DC

## 2016-10-10 MED FILL — ACCU-CHEK GUIDE TEST STRIP: 90 days supply | Qty: 300 | Fill #0

## 2016-10-10 MED FILL — ACCU-CHEK MULTICLIX LANCETS: 90 days supply | Qty: 306 | Fill #0

## 2016-11-03 ENCOUNTER — Telehealth: Payer: 59 | Admitting: Nurse Practitioner

## 2016-11-03 DIAGNOSIS — R1115 Cyclical vomiting syndrome unrelated to migraine: Secondary | ICD-10-CM

## 2016-11-03 NOTE — Progress Notes (Signed)
Based on what you shared with me it looks like you have a serious condition that should be evaluated in a face to face office visit.  NOTE: Even if you have entered your credit card information for this eVisit, you will not be charged.   * THIS HAS BEEN GOING ON FOR TO LONG AND I FEEL THAT YOU MAY NEED SOME FURTHER TESTING.    If you are having a true medical emergency please call 911.  If you need an urgent face to face visit, [facility] has four urgent care centers for your convenience.  If you need care fast and have a high deductible or no insurance consider:   [email or web address]  [phone]  [address], [place], [place] [address] 8 am to 8 pm Monday-Friday 10 am to 4 pm Saturday-Sunday   The following sites will take your  insurance:    . [facility] [place] a Provider at this Location  [address] [place], [place] [address] . 10 am to 8 pm Monday-Friday . 12 pm to 8 pm Saturday-Sunday   . [facility] at [place] a Provider at this Location  [address], [place], [place] [address] . 8 am to 8 pm Monday-Friday . 9 am to 6 pm Saturday . 11 am to 6 pm Sunday   . [facility] at [place] Get Driving Directions  [address] [place], [place] [address] . 8 am to 8 pm Monday-Friday . 8 am to 4 pm Saturday-Sunday   Your e-visit answers were reviewed by a board certified advanced clinical practitioner to complete your personal care plan.  Thank you for using e-Visits.

## 2016-11-04 ENCOUNTER — Emergency Department (HOSPITAL_COMMUNITY): Payer: 59

## 2016-11-04 ENCOUNTER — Inpatient Hospital Stay (HOSPITAL_COMMUNITY)
Admission: EM | Admit: 2016-11-04 | Discharge: 2016-11-08 | DRG: 419 | Disposition: A | Discharge status: Home / Self Care | Payer: 59 | Attending: Internal Medicine | Admitting: Internal Medicine

## 2016-11-04 ENCOUNTER — Encounter (HOSPITAL_COMMUNITY): Payer: Self-pay | Admitting: *Deleted

## 2016-11-04 ENCOUNTER — Encounter (HOSPITAL_COMMUNITY): Payer: Self-pay | Admitting: Emergency Medicine

## 2016-11-04 ENCOUNTER — Ambulatory Visit (HOSPITAL_COMMUNITY)
Admission: EM | Admit: 2016-11-04 | Discharge: 2016-11-04 | Disposition: A | Discharge status: Home / Self Care | Payer: 59 | Source: Home / Self Care

## 2016-11-04 DIAGNOSIS — F329 Major depressive disorder, single episode, unspecified: Secondary | ICD-10-CM | POA: Diagnosis present

## 2016-11-04 DIAGNOSIS — E785 Hyperlipidemia, unspecified: Secondary | ICD-10-CM | POA: Diagnosis present

## 2016-11-04 DIAGNOSIS — R197 Diarrhea, unspecified: Secondary | ICD-10-CM

## 2016-11-04 DIAGNOSIS — R509 Fever, unspecified: Secondary | ICD-10-CM | POA: Diagnosis not present

## 2016-11-04 DIAGNOSIS — E876 Hypokalemia: Secondary | ICD-10-CM | POA: Diagnosis not present

## 2016-11-04 DIAGNOSIS — G43A1 Cyclical vomiting, intractable: Secondary | ICD-10-CM

## 2016-11-04 DIAGNOSIS — Z825 Family history of asthma and other chronic lower respiratory diseases: Secondary | ICD-10-CM

## 2016-11-04 DIAGNOSIS — I4581 Long QT syndrome: Secondary | ICD-10-CM | POA: Diagnosis present

## 2016-11-04 DIAGNOSIS — Z8249 Family history of ischemic heart disease and other diseases of the circulatory system: Secondary | ICD-10-CM | POA: Diagnosis not present

## 2016-11-04 DIAGNOSIS — Z833 Family history of diabetes mellitus: Secondary | ICD-10-CM | POA: Diagnosis not present

## 2016-11-04 DIAGNOSIS — R1115 Cyclical vomiting syndrome unrelated to migraine: Secondary | ICD-10-CM

## 2016-11-04 DIAGNOSIS — E669 Obesity, unspecified: Secondary | ICD-10-CM | POA: Diagnosis present

## 2016-11-04 DIAGNOSIS — E1140 Type 2 diabetes mellitus with diabetic neuropathy, unspecified: Secondary | ICD-10-CM | POA: Diagnosis present

## 2016-11-04 DIAGNOSIS — Z794 Long term (current) use of insulin: Secondary | ICD-10-CM

## 2016-11-04 DIAGNOSIS — Z6832 Body mass index (BMI) 32.0-32.9, adult: Secondary | ICD-10-CM

## 2016-11-04 DIAGNOSIS — K8020 Calculus of gallbladder without cholecystitis without obstruction: Secondary | ICD-10-CM | POA: Diagnosis not present

## 2016-11-04 DIAGNOSIS — R1030 Lower abdominal pain, unspecified: Secondary | ICD-10-CM | POA: Diagnosis not present

## 2016-11-04 DIAGNOSIS — R739 Hyperglycemia, unspecified: Secondary | ICD-10-CM | POA: Diagnosis not present

## 2016-11-04 DIAGNOSIS — E1165 Type 2 diabetes mellitus with hyperglycemia: Secondary | ICD-10-CM | POA: Diagnosis present

## 2016-11-04 DIAGNOSIS — Z87891 Personal history of nicotine dependence: Secondary | ICD-10-CM | POA: Diagnosis not present

## 2016-11-04 DIAGNOSIS — K8000 Calculus of gallbladder with acute cholecystitis without obstruction: Secondary | ICD-10-CM | POA: Diagnosis not present

## 2016-11-04 DIAGNOSIS — I10 Essential (primary) hypertension: Secondary | ICD-10-CM | POA: Diagnosis present

## 2016-11-04 DIAGNOSIS — Z79899 Other long term (current) drug therapy: Secondary | ICD-10-CM

## 2016-11-04 DIAGNOSIS — R1084 Generalized abdominal pain: Secondary | ICD-10-CM | POA: Diagnosis not present

## 2016-11-04 DIAGNOSIS — R1011 Right upper quadrant pain: Secondary | ICD-10-CM

## 2016-11-04 DIAGNOSIS — R1110 Vomiting, unspecified: Secondary | ICD-10-CM | POA: Diagnosis not present

## 2016-11-04 DIAGNOSIS — F3289 Other specified depressive episodes: Secondary | ICD-10-CM | POA: Diagnosis not present

## 2016-11-04 DIAGNOSIS — R9431 Abnormal electrocardiogram [ECG] [EKG]: Secondary | ICD-10-CM | POA: Diagnosis not present

## 2016-11-04 DIAGNOSIS — D72829 Elevated white blood cell count, unspecified: Secondary | ICD-10-CM | POA: Diagnosis not present

## 2016-11-04 DIAGNOSIS — F32A Depression, unspecified: Secondary | ICD-10-CM | POA: Diagnosis present

## 2016-11-04 HISTORY — DX: Essential (primary) hypertension: I10

## 2016-11-04 LAB — POCT I-STAT, CHEM 8
BUN: 18 mg/dL (ref 6–20)
CALCIUM ION: 1.08 mmol/L — AB (ref 1.15–1.40)
Chloride: 95 mmol/L — ABNORMAL LOW (ref 101–111)
Creatinine, Ser: 0.9 mg/dL (ref 0.44–1.00)
Glucose, Bld: 330 mg/dL — ABNORMAL HIGH (ref 65–99)
HCT: 42 % (ref 36.0–46.0)
Hemoglobin: 14.3 g/dL (ref 12.0–15.0)
Potassium: 3.8 mmol/L (ref 3.5–5.1)
SODIUM: 134 mmol/L — AB (ref 135–145)
TCO2: 27 mmol/L (ref 0–100)

## 2016-11-04 LAB — COMPREHENSIVE METABOLIC PANEL
ALT: 11 U/L — ABNORMAL LOW (ref 14–54)
AST: 15 U/L (ref 15–41)
Albumin: 3 g/dL — ABNORMAL LOW (ref 3.5–5.0)
Alkaline Phosphatase: 76 U/L (ref 38–126)
Anion gap: 12 (ref 5–15)
BUN: 17 mg/dL (ref 6–20)
CO2: 22 mmol/L (ref 22–32)
Calcium: 8.1 mg/dL — ABNORMAL LOW (ref 8.9–10.3)
Chloride: 98 mmol/L — ABNORMAL LOW (ref 101–111)
Creatinine, Ser: 1 mg/dL (ref 0.44–1.00)
GFR calc Af Amer: >60 mL/min (ref >60)
GFR calc non Af Amer: 58 mL/min — ABNORMAL LOW (ref >60)
Glucose, Bld: 350 mg/dL — ABNORMAL HIGH (ref 65–99)
Potassium: 3.6 mmol/L (ref 3.5–5.1)
Sodium: 132 mmol/L — ABNORMAL LOW (ref 135–145)
Total Bilirubin: 0.9 mg/dL (ref 0.3–1.2)
Total Protein: 6.9 g/dL (ref 6.5–8.1)

## 2016-11-04 LAB — URINALYSIS, ROUTINE W REFLEX MICROSCOPIC
Bilirubin Urine: NEGATIVE
Glucose, UA: >=500 mg/dL — AB
Hgb urine dipstick: NEGATIVE
Ketones, ur: NEGATIVE mg/dL
Leukocytes, UA: NEGATIVE
Nitrite: NEGATIVE
Protein, ur: 100 mg/dL — AB
Specific Gravity, Urine: 1.033 — ABNORMAL HIGH (ref 1.005–1.030)
pH: 5 (ref 5.0–8.0)

## 2016-11-04 LAB — CBG MONITORING, ED: Glucose-Capillary: 334 mg/dL — ABNORMAL HIGH (ref 65–99)

## 2016-11-04 LAB — CBC
HCT: 38 % (ref 36.0–46.0)
Hemoglobin: 12.5 g/dL (ref 12.0–15.0)
MCH: 28.1 pg (ref 26.0–34.0)
MCHC: 32.9 g/dL (ref 30.0–36.0)
MCV: 85.4 fL (ref 78.0–100.0)
Platelets: 250 10*3/uL (ref 150–400)
RBC: 4.45 MIL/uL (ref 3.87–5.11)
RDW: 12.7 % (ref 11.5–15.5)
WBC: 16.1 10*3/uL — ABNORMAL HIGH (ref 4.0–10.5)

## 2016-11-04 LAB — LIPASE, BLOOD: Lipase: <10 U/L — ABNORMAL LOW (ref 11–51)

## 2016-11-04 MED ORDER — ONDANSETRON HCL 4 MG/2ML IJ SOLN
INTRAMUSCULAR | Status: AC
  Filled 2016-11-04: qty 2

## 2016-11-04 MED ORDER — CALCIUM CARBONATE ANTACID 500 MG PO CHEW
2.0000 | CHEWABLE_TABLET | Freq: Every day | ORAL | Status: DC | PRN
  Filled 2016-11-04: qty 4

## 2016-11-04 MED ORDER — PIPERACILLIN-TAZOBACTAM 3.375 G IVPB 30 MIN
3.3750 g | Freq: Once | INTRAVENOUS | Status: AC
  Administered 2016-11-04: 3.375 g via INTRAVENOUS
  Filled 2016-11-04: qty 50

## 2016-11-04 MED ORDER — HYDROMORPHONE HCL 2 MG/ML IJ SOLN
1.0000 mg | Freq: Once | INTRAMUSCULAR | Status: AC
  Administered 2016-11-04: 1 mg via INTRAVENOUS

## 2016-11-04 MED ORDER — IBUPROFEN 200 MG PO TABS
200.0000 mg | ORAL_TABLET | Freq: Four times a day (QID) | ORAL | Status: DC | PRN
  Administered 2016-11-05 – 2016-11-08 (×3): 200 mg via ORAL
  Filled 2016-11-04 (×3): qty 1

## 2016-11-04 MED ORDER — HYDROMORPHONE HCL 2 MG/ML IJ SOLN
0.5000 mg | INTRAMUSCULAR | Status: DC | PRN
  Administered 2016-11-05 – 2016-11-07 (×5): 1 mg via INTRAVENOUS
  Filled 2016-11-04 (×6): qty 1

## 2016-11-04 MED ORDER — ACETAMINOPHEN 325 MG PO TABS
ORAL_TABLET | ORAL | Status: AC
  Filled 2016-11-04: qty 3

## 2016-11-04 MED ORDER — ARIPIPRAZOLE 5 MG PO TABS
2.5000 mg | ORAL_TABLET | Freq: Every day | ORAL | Status: DC
  Filled 2016-11-04: qty 1

## 2016-11-04 MED ORDER — ONDANSETRON HCL 4 MG/2ML IJ SOLN
4.0000 mg | Freq: Once | INTRAMUSCULAR | Status: AC
  Administered 2016-11-04: 4 mg via INTRAVENOUS

## 2016-11-04 MED ORDER — SODIUM CHLORIDE 0.9 % IV SOLN
INTRAVENOUS | Status: DC
  Administered 2016-11-05: 01:00:00 via INTRAVENOUS

## 2016-11-04 MED ORDER — ACETAMINOPHEN 325 MG PO TABS
650.0000 mg | ORAL_TABLET | Freq: Four times a day (QID) | ORAL | Status: DC | PRN
  Administered 2016-11-05: 650 mg via ORAL
  Filled 2016-11-04: qty 2

## 2016-11-04 MED ORDER — IOPAMIDOL (ISOVUE-300) INJECTION 61%
INTRAVENOUS | Status: AC
  Administered 2016-11-04: 100 mL
  Filled 2016-11-04: qty 100

## 2016-11-04 MED ORDER — INSULIN ASPART 100 UNIT/ML ~~LOC~~ SOLN
0.0000 [IU] | SUBCUTANEOUS | Status: DC
  Administered 2016-11-05: 3 [IU] via SUBCUTANEOUS
  Administered 2016-11-05 – 2016-11-06 (×3): 5 [IU] via SUBCUTANEOUS
  Administered 2016-11-06: 3 [IU] via SUBCUTANEOUS
  Administered 2016-11-06: 8 [IU] via SUBCUTANEOUS
  Administered 2016-11-06: 3 [IU] via SUBCUTANEOUS
  Administered 2016-11-06: 11 [IU] via SUBCUTANEOUS
  Administered 2016-11-06: 3 [IU] via SUBCUTANEOUS
  Administered 2016-11-07: 5 [IU] via SUBCUTANEOUS
  Administered 2016-11-07: 2 [IU] via SUBCUTANEOUS
  Administered 2016-11-07 (×2): 3 [IU] via SUBCUTANEOUS

## 2016-11-04 MED ORDER — SODIUM CHLORIDE 0.9 % IV BOLUS (SEPSIS)
1000.0000 mL | Freq: Once | INTRAVENOUS | Status: AC
  Administered 2016-11-04: 1000 mL via INTRAVENOUS

## 2016-11-04 MED ORDER — SERTRALINE HCL 100 MG PO TABS: 200.0000 mg | ORAL_TABLET | Freq: Every day | ORAL | Status: DC

## 2016-11-04 MED ORDER — ACETAMINOPHEN 325 MG PO TABS
975.0000 mg | ORAL_TABLET | Freq: Once | ORAL | Status: AC
  Administered 2016-11-04: 975 mg via ORAL

## 2016-11-04 MED ORDER — ONDANSETRON HCL 4 MG/2ML IJ SOLN
4.0000 mg | Freq: Once | INTRAMUSCULAR | Status: AC
Start: 2016-11-04 — End: 2016-11-04
  Administered 2016-11-04: 4 mg via INTRAVENOUS

## 2016-11-04 MED ORDER — HYDROMORPHONE HCL 2 MG/ML IJ SOLN
1.0000 mg | Freq: Once | INTRAMUSCULAR | Status: AC
  Administered 2016-11-04: 1 mg via INTRAVENOUS
  Filled 2016-11-04: qty 1

## 2016-11-04 MED ORDER — INSULIN ASPART 100 UNIT/ML ~~LOC~~ SOLN
8.0000 [IU] | Freq: Once | SUBCUTANEOUS | Status: AC
  Administered 2016-11-04: 8 [IU] via INTRAVENOUS
  Filled 2016-11-04: qty 1

## 2016-11-04 MED ORDER — PIPERACILLIN-TAZOBACTAM 3.375 G IVPB
3.3750 g | Freq: Three times a day (TID) | INTRAVENOUS | Status: DC
  Administered 2016-11-05 – 2016-11-07 (×7): 3.375 g via INTRAVENOUS
  Filled 2016-11-04 (×8): qty 50

## 2016-11-04 MED ORDER — ONDANSETRON HCL 4 MG/2ML IJ SOLN
4.0000 mg | Freq: Four times a day (QID) | INTRAMUSCULAR | Status: DC | PRN
Start: 2016-11-04 — End: 2016-11-08

## 2016-11-04 MED ORDER — ONDANSETRON HCL 4 MG/2ML IJ SOLN: 4.0000 mg | Freq: Once | INTRAMUSCULAR | Status: DC

## 2016-11-04 MED ORDER — SODIUM CHLORIDE 0.9 % IV SOLN
Freq: Once | INTRAVENOUS | Status: AC
  Administered 2016-11-04: 15:00:00 via INTRAVENOUS

## 2016-11-04 NOTE — ED Notes (Signed)
Pt not populating in pyxis. Temporary profile made for pt, zofran override with Richarda Blade, Rn. 1 mg ordered dilaudid pulled and wasted with Bethena Midget, RN.

## 2016-11-04 NOTE — ED Triage Notes (Signed)
Pt slightly unsteady on feet with ambulation.

## 2016-11-04 NOTE — Discharge Instructions (Signed)
Please go straight to ED for lower abdominal pain

## 2016-11-04 NOTE — ED Triage Notes (Signed)
IV left in place . Left AC.

## 2016-11-04 NOTE — ED Notes (Signed)
Pt transported to CT ?

## 2016-11-04 NOTE — ED Notes (Signed)
ED Provider at bedside. 

## 2016-11-04 NOTE — ED Notes (Signed)
Patient unable to urinate at this time. 

## 2016-11-04 NOTE — Consult Note (Signed)
Reason for Consult:abdominal pain Referring Physician: Keziah Avis Blodgett is an 66 y.o. female.  HPI:  Pt is a 66 yo F with 1 week h/o abdominal pain, nausea and diarrhea.  She thought she had a GI bug and has been trying to deal with it on her own.  She has been around many sick people.  She has had temp as high as 103 at home.  She tried tylenol without relief.  She describes the pain as severe in her upper abdomen, coming in waves.  The waves would last around 30 minutes.  She felt dramatically better on Thursday (3 days prior to admission) and thought the sickness had passed, but felt horrible again on Friday.  She never had pain like this before.  Passing gas didn't make her feel better.  Having a BM didn't make her feel better.   She denies jaundice or discolored urine/stool.  She denies family history of gallbladder disease.    Past Medical History:  Diagnosis Date  . Depression   . Diabetes mellitus    T2DM  . Hyperlipidemia   . Hypertension   . Obesity (BMI 30-39.9)     History reviewed. No pertinent surgical history.  Family History  Problem Relation Age of Onset  . Diabetes Mother   . Heart disease Mother   . Allergies Mother   . Asthma Mother   . Deep vein thrombosis Mother     Social History:  reports that she quit smoking about 32 years ago. Her smoking use included Cigarettes. She has a 2.25 pack-year smoking history. She has never used smokeless tobacco. She reports that she drinks alcohol. She reports that she does not use drugs.  Allergies:  Allergies  Allergen Reactions  . Trulicity [Dulaglutide] Nausea And Vomiting  . Metformin And Related Diarrhea    Medications:   ARIPiprazole (ABILIFY) 5 MG tablet    calcium carbonate (TUMS EX) 750 MG chewable tablet    Cholecalciferol (VITAMIN D3 PO)    Cyanocobalamin (VITAMIN B-12 SL)    ibuprofen (ADVIL,MOTRIN) 200 MG tablet    Insulin Glargine (LANTUS SOLOSTAR) 100 UNIT/ML Solostar Pen    insulin lispro  (HUMALOG KWIKPEN) 100 UNIT/ML KiwkPen    MAGNESIUM PO    polyvinyl alcohol (ARTIFICIAL TEARS) 1.4 % ophthalmic solution    sertraline (ZOLOFT) 100 MG tablet    Blood Glucose Monitoring Suppl (ACCU-CHEK GUIDE) w/Device KIT    glucose blood (ACCU-CHEK GUIDE) test strip    Lancets (ACCU-CHEK MULTICLIX) lancets    TRUEPLUS LANCETS 33G MISC    UNIFINE PENTIPS 31G X 8 MM MISC      Results for orders placed or performed during the hospital encounter of 11/04/16 (from the past 48 hour(s))  CBG monitoring, ED     Status: Abnormal   Collection Time: 11/04/16  3:58 PM  Result Value Ref Range   Glucose-Capillary 334 (H) 65 - 99 mg/dL  Lipase, blood     Status: Abnormal   Collection Time: 11/04/16  4:00 PM  Result Value Ref Range   Lipase <10 (L) 11 - 51 U/L  Comprehensive metabolic panel     Status: Abnormal   Collection Time: 11/04/16  4:00 PM  Result Value Ref Range   Sodium 132 (L) 135 - 145 mmol/L   Potassium 3.6 3.5 - 5.1 mmol/L   Chloride 98 (L) 101 - 111 mmol/L   CO2 22 22 - 32 mmol/L   Glucose, Bld 350 (H) 65 - 99 mg/dL  BUN 17 6 - 20 mg/dL   Creatinine, Ser 1.00 0.44 - 1.00 mg/dL   Calcium 8.1 (L) 8.9 - 10.3 mg/dL   Total Protein 6.9 6.5 - 8.1 g/dL   Albumin 3.0 (L) 3.5 - 5.0 g/dL   AST 15 15 - 41 U/L   ALT 11 (L) 14 - 54 U/L   Alkaline Phosphatase 76 38 - 126 U/L   Total Bilirubin 0.9 0.3 - 1.2 mg/dL   GFR calc non Af Amer 58 (L) >60 mL/min   GFR calc Af Amer >60 >60 mL/min    Comment: (NOTE) The eGFR has been calculated using the CKD EPI equation. This calculation has not been validated in all clinical situations. eGFR's persistently <60 mL/min signify possible Chronic Kidney Disease.    Anion gap 12 5 - 15  CBC     Status: Abnormal   Collection Time: 11/04/16  4:00 PM  Result Value Ref Range   WBC 16.1 (H) 4.0 - 10.5 K/uL   RBC 4.45 3.87 - 5.11 MIL/uL   Hemoglobin 12.5 12.0 - 15.0 g/dL   HCT 38.0 36.0 - 46.0 %   MCV 85.4 78.0 - 100.0 fL   MCH 28.1 26.0 - 34.0  pg   MCHC 32.9 30.0 - 36.0 g/dL   RDW 12.7 11.5 - 15.5 %   Platelets 250 150 - 400 K/uL  Urinalysis, Routine w reflex microscopic     Status: Abnormal   Collection Time: 11/04/16  8:41 PM  Result Value Ref Range   Color, Urine AMBER (A) YELLOW    Comment: BIOCHEMICALS MAY BE AFFECTED BY COLOR   APPearance HAZY (A) CLEAR   Specific Gravity, Urine 1.033 (H) 1.005 - 1.030   pH 5.0 5.0 - 8.0   Glucose, UA >=500 (A) NEGATIVE mg/dL   Hgb urine dipstick NEGATIVE NEGATIVE   Bilirubin Urine NEGATIVE NEGATIVE   Ketones, ur NEGATIVE NEGATIVE mg/dL   Protein, ur 100 (A) NEGATIVE mg/dL   Nitrite NEGATIVE NEGATIVE   Leukocytes, UA NEGATIVE NEGATIVE   RBC / HPF 0-5 0 - 5 RBC/hpf   WBC, UA 6-30 0 - 5 WBC/hpf   Bacteria, UA FEW (A) NONE SEEN   Squamous Epithelial / LPF 0-5 (A) NONE SEEN   Mucous PRESENT    Hyaline Casts, UA PRESENT     Ct Abdomen Pelvis W Contrast  Result Date: 11/04/2016 CLINICAL DATA:  Vomiting, diarrhea EXAM: CT ABDOMEN AND PELVIS WITH CONTRAST TECHNIQUE: Multidetector CT imaging of the abdomen and pelvis was performed using the standard protocol following bolus administration of intravenous contrast. CONTRAST:  126m ISOVUE-300 IOPAMIDOL (ISOVUE-300) INJECTION 61% COMPARISON:  None. FINDINGS: Lower chest: Lung bases are clear. Hepatobiliary: Liver is within normal limits. Multiple gallstones with gallbladder wall thickening and surrounding inflammatory changes, worrisome for acute cholecystitis. No intrahepatic or extrahepatic ductal dilatation. Pancreas: Within normal limits. Spleen: Within normal limits. Adrenals/Urinary Tract: Adrenal glands are within normal limits. Kidneys are within normal limits.  No hydronephrosis. Bladder is underdistended but unremarkable. Stomach/Bowel: Stomach is notable for a tiny hiatal hernia. No evidence of bowel obstruction. Normal appendix (series 201/ image 66). Vascular/Lymphatic: No evidence of abdominal aortic aneurysm. Atherosclerotic  calcifications of the abdominal aorta and branch vessels. No suspicious abdominopelvic lymphadenopathy. Reproductive: Uterus is grossly unremarkable. Bilateral ovaries are unremarkable. Other: No abdominopelvic ascites. Musculoskeletal: Visualized osseous structures are within normal limits. IMPRESSION: Cholelithiasis with findings suspicious for acute cholecystitis. These results were called by telephone at the time of interpretation on 11/04/2016 at 9:57 pm  to Dr. Virgel Manifold , who verbally acknowledged these results. Electronically Signed   By: Julian Hy M.D.   On: 11/04/2016 21:57    Review of Systems  Constitutional: Positive for chills and fever.  HENT: Negative.   Eyes: Negative.   Respiratory: Negative.   Cardiovascular: Negative.   Gastrointestinal: Positive for abdominal pain, diarrhea, nausea and vomiting.  Musculoskeletal: Positive for back pain (occasionally with the upper abdominal pain.).  Skin: Negative.   Neurological: Negative.   Endo/Heme/Allergies: Negative.   Psychiatric/Behavioral: Negative.      Blood pressure 125/60, pulse 84, temperature 100.2 F (37.9 C), temperature source Oral, resp. rate 18, SpO2 95 %. Physical Exam  Constitutional: She is oriented to person, place, and time. She appears well-developed and well-nourished. No distress.  HENT:  Head: Normocephalic and atraumatic.  Right Ear: External ear normal.  Left Ear: External ear normal.  Eyes: Conjunctivae are normal. Pupils are equal, round, and reactive to light. Right eye exhibits no discharge. Left eye exhibits no discharge. No scleral icterus.  Neck: Normal range of motion. Neck supple. No thyromegaly present.  Cardiovascular: Normal rate, regular rhythm, normal heart sounds and intact distal pulses.  Exam reveals no gallop and no friction rub.   No murmur heard. Respiratory: Effort normal and breath sounds normal. No respiratory distress. She has no wheezes. She has no rales. She exhibits  no tenderness.  GI: Soft. She exhibits distension (mildly distended). She exhibits no mass. There is tenderness (RUQ). There is no rebound and no guarding.  Musculoskeletal: Normal range of motion. She exhibits no edema, tenderness or deformity.  Neurological: She is alert and oriented to person, place, and time. Coordination normal.  Skin: Skin is warm and dry. No rash noted. She is not diaphoretic. No erythema. No pallor.  Psychiatric: She has a normal mood and affect. Her behavior is normal. Judgment and thought content normal.    Assessment/Plan: Acute cholecystitis with calculous DM- uncontrolled with neuropathy HTN Depression  NPO IV fluids EKG Check coags Appreciate TRH management in order to get blood sugar under better control and for risk stratification in this diabetic patient.   OK for clear liquids until 1 am.  Linn Clavin 11/04/2016, 11:09 PM

## 2016-11-04 NOTE — ED Notes (Signed)
Explained to pt need for urine sample for UA. Pt states she still does not have to use the restroom. Urine specimen cup left on counter, pt encouraged to try to void.

## 2016-11-04 NOTE — ED Notes (Signed)
Pt ambulatory to bathroom for UA specimen.

## 2016-11-04 NOTE — ED Triage Notes (Signed)
Pt. Stated, I took 3 Tylenol at [facility].

## 2016-11-04 NOTE — ED Notes (Signed)
First RN notified of pt.

## 2016-11-04 NOTE — Progress Notes (Signed)
Pharmacy Antibiotic Note  Cassandra Morris is a 66 y.o. female admitted on 11/04/2016 with intra-abd infection - acute cholecystitis.  Pharmacy has been consulted for Zosyn dosing.  Plan: Zosyn 3.375gm IV q8h -  doses over 4 hours Will f/u micro data, renal function, and pt's clinical condition      Temp (24hrs), Avg:100.2 F (37.9 C), Min:100.2 F (37.9 C), Max:100.2 F (37.9 C)   Recent Labs Lab 11/04/16 1338 11/04/16 1600  WBC  --  16.1*  CREATININE 0.90 1.00    CrCl cannot be calculated (Unknown ideal weight.).    Allergies  Allergen Reactions  . Trulicity [Dulaglutide] Nausea And Vomiting  . Metformin And Related Diarrhea    Antimicrobials this admission: 3/11 zosyn >>   Microbiology results:  Thank you for allowing pharmacy to be a part of this patient's care.  Sherlon Handing, PharmD, BCPS Clinical pharmacist, pager [phone] 11/04/2016 11:18 PM

## 2016-11-04 NOTE — ED Notes (Signed)
Pt reports she does not have to use the restroom as this time. Explained to pt need for urine sample, pt verbalized understanding but states she does not have to void at this time.

## 2016-11-04 NOTE — ED Notes (Signed)
Spoke with pharmacy, unable to rectify override pull medications under pt profile in the pyxis. Message reads "insufficient quantity" in both Pod E and Trauma Pyxis. Pharmacy aware.

## 2016-11-04 NOTE — ED Notes (Signed)
Admitting provider at bedside.

## 2016-11-04 NOTE — ED Triage Notes (Signed)
Pt. Stated, I've had N/V/D and abdominal pain for over a week. Went to UC and they sent me here for further work up

## 2016-11-04 NOTE — ED Triage Notes (Signed)
C/O vomiting and diarrhea x 1 wk; only able to keep down small amounts ginger ale.  Has had temps up to 103.  C/O low back pain at times and mid abd pain when hiccupping or yawning.  C/O white tongue.

## 2016-11-04 NOTE — ED Notes (Signed)
CT called and notified pt has appropriate IV for scan.

## 2016-11-04 NOTE — H&P (Signed)
History and Physical    Cassandra Morris YBW:[id number] DOB: 04-04-1951 DOA: 11/04/2016  PCP: Cathlean Cower, MD  Patient coming from: Home  I have personally briefly reviewed patient's old medical records in [place]  Chief Complaint: Abdominal pain  HPI: Cassandra Morris is a 66 y.o. female with medical history significant of DM2, HTN.  Patient presents to the ED with c/o persistent, unchanged upper abdominal pain.  Symptoms onset 1 week ago.  Associated N/V/D and fever with Tm 103.  She works for [employer] and thought she might be exposed to something from a sick patient.  She was seen at [facility] today PTA and sent in to ED to r/o appendicitis.  Tylenol given at [facility] without sig relief of symptoms.   ED Course: Work up in the ED shows normal LFTs and lipase, but CT scan reveals acute calculous cholecystitis.   Review of Systems: As per HPI otherwise 10 point review of systems negative.   Past Medical History:  Diagnosis Date  . Depression   . Diabetes mellitus    T2DM  . Hyperlipidemia   . Hypertension   . Obesity (BMI 30-39.9)     History reviewed. No pertinent surgical history.   reports that she quit smoking about 32 years ago. Her smoking use included Cigarettes. She has a 2.25 pack-year smoking history. She has never used smokeless tobacco. She reports that she drinks alcohol. She reports that she does not use drugs.  Allergies  Allergen Reactions  . Trulicity [Dulaglutide] Nausea And Vomiting  . Metformin And Related Diarrhea    Family History  Problem Relation Age of Onset  . Diabetes Mother   . Heart disease Mother   . Allergies Mother   . Asthma Mother   . Deep vein thrombosis Mother      Prior to Admission medications   Medication Sig Start Date End Date Taking? Authorizing Provider  ARIPiprazole (ABILIFY) 5 MG tablet 1/2 tab by mouth once per day Patient taking differently: Take 2.5 mg by mouth daily.  03/01/16  Yes Biagio Borg, MD  calcium carbonate  (TUMS EX) 750 MG chewable tablet Chew 1-2 tablets by mouth daily as needed for heartburn (acid indigestion).   Yes Historical Provider, MD  Cholecalciferol (VITAMIN D3 PO) Take 2 capsules by mouth daily.   Yes Historical Provider, MD  Cyanocobalamin (VITAMIN B-12 SL) Place 1 tablet under the tongue daily.   Yes Historical Provider, MD  ibuprofen (ADVIL,MOTRIN) 200 MG tablet Take 200 mg by mouth every 6 (six) hours as needed for headache (pain).   Yes Historical Provider, MD  Insulin Glargine (LANTUS SOLOSTAR) 100 UNIT/ML Solostar Pen Inject 50 Units into the skin daily at 10 pm. Patient taking differently: Inject 50 Units into the skin at bedtime.  07/13/16  Yes Philemon Kingdom, MD  insulin lispro (HUMALOG KWIKPEN) 100 UNIT/ML KiwkPen INJECT 15-30 UNITS 3 TIMES DAILY 15 MINUTES BEFORE MEALS Patient taking differently: Inject 15-30 Units into the skin See admin instructions. Inject 15-30 units subcutaneously 15 minutes before meals 03/12/16  Yes Philemon Kingdom, MD  MAGNESIUM PO Take 1 tablet by mouth daily.   Yes Historical Provider, MD  polyvinyl alcohol (ARTIFICIAL TEARS) 1.4 % ophthalmic solution Place 1 drop into both eyes daily as needed for dry eyes.   Yes Historical Provider, MD  sertraline (ZOLOFT) 100 MG tablet TAKE 2 TABLET BY MOUTH DAILY 10/08/16  Yes Biagio Borg, MD  Blood Glucose Monitoring Suppl (ACCU-CHEK GUIDE) w/Device KIT 1  each by Does not apply route daily. 10/10/16   Philemon Kingdom, MD  glucose blood (ACCU-CHEK GUIDE) test strip Use as instructed to check sugar 3 times daily 10/10/16   Philemon Kingdom, MD  Lancets (ACCU-CHEK MULTICLIX) lancets Use as instructed to check sugar 3 times daily 10/10/16   Philemon Kingdom, MD  TRUEPLUS LANCETS 33G MISC Use to test blood sugar 3 times daily. Dx: E11.65 10/27/15   Philemon Kingdom, MD  UNIFINE PENTIPS 31G X 8 MM MISC USE 4 TIMES DAILY 08/02/14   Doe-Hyun Kyra Searles, DO    Physical Exam: Vitals:   11/04/16 2045 11/04/16 2230 11/04/16 2248  11/04/16 2300  BP: (!) 131/51 133/60 125/60 123/58  Pulse: 77 84 84 84  Resp:   18   Temp:      TempSrc:      SpO2: 96% 92% 95% 95%    Constitutional: NAD, calm, comfortable Eyes: PERRL, lids and conjunctivae normal ENMT: Mucous membranes are moist. Posterior pharynx clear of any exudate or lesions.Normal dentition.  Neck: normal, supple, no masses, no thyromegaly Respiratory: clear to auscultation bilaterally, no wheezing, no crackles. Normal respiratory effort. No accessory muscle use.  Cardiovascular: Regular rate and rhythm, no murmurs / rubs / gallops. No extremity edema. 2+ pedal pulses. No carotid bruits.  Abdomen: RUQ TTP Musculoskeletal: no clubbing / cyanosis. No joint deformity upper and lower extremities. Good ROM, no contractures. Normal muscle tone.  Skin: no rashes, lesions, ulcers. No induration Neurologic: CN 2-12 grossly intact. Sensation intact, DTR normal. Strength 5/5 in all 4.  Psychiatric: Normal judgment and insight. Alert and oriented x 3. Normal mood.    Labs on Admission: I have personally reviewed following labs and imaging studies  CBC:  Recent Labs Lab 11/04/16 1338 11/04/16 1600  WBC  --  16.1*  HGB 14.3 12.5  HCT 42.0 38.0  MCV  --  85.4  PLT  --  295   Basic Metabolic Panel:  Recent Labs Lab 11/04/16 1338 11/04/16 1600  NA 134* 132*  K 3.8 3.6  CL 95* 98*  CO2  --  22  GLUCOSE 330* 350*  BUN 18 17  CREATININE 0.90 1.00  CALCIUM  --  8.1*   GFR: CrCl cannot be calculated (Unknown ideal weight.). Liver Function Tests:  Recent Labs Lab 11/04/16 1600  AST 15  ALT 11*  ALKPHOS 76  BILITOT 0.9  PROT 6.9  ALBUMIN 3.0*    Recent Labs Lab 11/04/16 1600  LIPASE <10*   No results for input(s): AMMONIA in the last 168 hours. Coagulation Profile: No results for input(s): INR, PROTIME in the last 168 hours. Cardiac Enzymes: No results for input(s): CKTOTAL, CKMB, CKMBINDEX, TROPONINI in the last 168 hours. BNP (last 3  results) No results for input(s): PROBNP in the last 8760 hours. HbA1C: No results for input(s): HGBA1C in the last 72 hours. CBG:  Recent Labs Lab 11/04/16 1558  GLUCAP 334*   Lipid Profile: No results for input(s): CHOL, HDL, LDLCALC, TRIG, CHOLHDL, LDLDIRECT in the last 72 hours. Thyroid Function Tests: No results for input(s): TSH, T4TOTAL, FREET4, T3FREE, THYROIDAB in the last 72 hours. Anemia Panel: No results for input(s): VITAMINB12, FOLATE, FERRITIN, TIBC, IRON, RETICCTPCT in the last 72 hours. Urine analysis:    Component Value Date/Time   COLORURINE AMBER (A) 11/04/2016 2041   APPEARANCEUR HAZY (A) 11/04/2016 2041   LABSPEC 1.033 (H) 11/04/2016 2041   PHURINE 5.0 11/04/2016 2041   GLUCOSEU >=500 (A) 11/04/2016 2041  GLUCOSEU NEGATIVE 09/02/2015 1652   HGBUR NEGATIVE 11/04/2016 2041   HGBUR negative 11/29/2008 0843   BILIRUBINUR NEGATIVE 11/04/2016 2041   BILIRUBINUR n 04/20/2014 [place] 11/04/2016 2041   PROTEINUR 100 (A) 11/04/2016 2041   UROBILINOGEN 0.2 09/02/2015 1652   NITRITE NEGATIVE 11/04/2016 2041   LEUKOCYTESUR NEGATIVE 11/04/2016 2041    Radiological Exams on Admission: Ct Abdomen Pelvis W Contrast  Result Date: 11/04/2016 CLINICAL DATA:  Vomiting, diarrhea EXAM: CT ABDOMEN AND PELVIS WITH CONTRAST TECHNIQUE: Multidetector CT imaging of the abdomen and pelvis was performed using the standard protocol following bolus administration of intravenous contrast. CONTRAST:  167m ISOVUE-300 IOPAMIDOL (ISOVUE-300) INJECTION 61% COMPARISON:  None. FINDINGS: Lower chest: Lung bases are clear. Hepatobiliary: Liver is within normal limits. Multiple gallstones with gallbladder wall thickening and surrounding inflammatory changes, worrisome for acute cholecystitis. No intrahepatic or extrahepatic ductal dilatation. Pancreas: Within normal limits. Spleen: Within normal limits. Adrenals/Urinary Tract: Adrenal glands are within normal limits. Kidneys are  within normal limits.  No hydronephrosis. Bladder is underdistended but unremarkable. Stomach/Bowel: Stomach is notable for a tiny hiatal hernia. No evidence of bowel obstruction. Normal appendix (series 201/ image 66). Vascular/Lymphatic: No evidence of abdominal aortic aneurysm. Atherosclerotic calcifications of the abdominal aorta and branch vessels. No suspicious abdominopelvic lymphadenopathy. Reproductive: Uterus is grossly unremarkable. Bilateral ovaries are unremarkable. Other: No abdominopelvic ascites. Musculoskeletal: Visualized osseous structures are within normal limits. IMPRESSION: Cholelithiasis with findings suspicious for acute cholecystitis. These results were called by telephone at the time of interpretation on 11/04/2016 at 9:57 pm to Dr. SVirgel Manifold, who verbally acknowledged these results. Electronically Signed   By: SJulian HyM.D.   On: 11/04/2016 21:57    EKG: Independently reviewed.  Assessment/Plan Principal Problem:   Acute calculous cholecystitis Active Problems:   Type 2 diabetes mellitus with diabetic neuropathy, with long-term current use of insulin (HJermyn    1. Acute calculous cholecystitis - 1. Zosyn 2. Surgery to eval 3. NPO and SCDs only until surgical timing determined 4. Zofran PRN nausea 5. Dilaudid PRN pain 6. Tylenol PRN fever 7. Repeat CMP and CBC in AM 8. IVF: NS at 125 cc/hr while NPO 2. DM2 - 1. Hold home insulin which it sounds like she hasnt taken in 5 days anyhow 2. Put on mod dose SSI Q4H while NPO  DVT prophylaxis: SCDs for now Code Status: Full Family Communication: No family in room Disposition Plan: Home after admit and likely cholecystectomy Consults called: EDP spoke with Dr. BBarry Dieneswho will eval patient Admission status: Admit to inpatient   GEtta QuillDO [facility] Pager [phone] If 7AM-7PM, please contact day team taking care of patient [email or web address] Password [facility] 11/04/2016, 11:16 PM

## 2016-11-04 NOTE — ED Provider Notes (Signed)
[facility] DEPT Provider Note   CSN: [id number] Arrival date & time: 11/04/16  1546  By signing my name below, I, [name], attest that this documentation has been prepared under the direction and in the presence of Virgel Manifold, MD. Electronically Signed: [name], ED Scribe. 11/04/16. 6:12 PM.  History   Chief Complaint Chief Complaint  Patient presents with  . Abdominal Pain  . Nausea  . Emesis  . Diarrhea  . Fever   The history is provided by the patient. No language interpreter was used.    HPI Comments: Cassandra Morris is a 66 y.o. female with a PMHx of DM, obesity, HLD/HTN, who presents to the Emergency Department complaining of persistent, unchanged upper abdominal pain beginning one week ago. She notes associated nausea, vomiting, diarrhea, and fever (Tmax 103) secondary to her abdominal pain. She was seen at [facility] prior to arrival and was referred into the ED to r/o appendicitis. She was given Tylenol at [facility] without significant relief of her symptoms. 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 No PSHx to the abdomen. She denies melena, hematochezia, chest pain, shortness of breath, or any other associated symptoms.   Past Medical History:  Diagnosis Date  . Depression   . Diabetes mellitus    T2DM  . Hyperlipidemia   . Obesity (BMI 30-39.9)    Patient Active Problem List   Diagnosis Date Noted  . Type 2 diabetes mellitus with diabetic neuropathy, with long-term current use of insulin ([place]) 03/12/2016  . Wellness examination 03/02/2015  . Upper airway cough syndrome 03/02/2015  . Nasal sore 03/02/2015  . Allergic rhinitis 12/08/2014  . Diabetic polyneuropathy ([place]) 04/27/2014  . ABDOMINAL PAIN 09/26/2010  . Essential hypertension 07/26/2010  . Hyperlipidemia 12/02/2007  . Depression 12/02/2007   History reviewed. No pertinent surgical history.  OB History    No data available     Home  Medications    Prior to Admission medications   Medication Sig Start Date End Date Taking? Authorizing Provider  Adenosine Phosphate (ADENOSINE-5-MONOPHOSPHATE) POWD 1 capsule by Does not apply route.    Historical Provider, MD  ARIPiprazole (ABILIFY) 5 MG tablet 1/2 tab by mouth once per day 03/01/16   Biagio Borg, MD  Blood Glucose Monitoring Suppl (ACCU-CHEK GUIDE) w/Device KIT 1 each by Does not apply route daily. 10/10/16   Philemon Kingdom, MD  Cholecalciferol (VITAMIN D3) 400 UNITS CAPS Take 8 capsules by mouth.    Historical Provider, MD  Cinnamon 500 MG capsule Take 500 mg by mouth daily.    Historical Provider, MD  Cyanocobalamin (VITAMIN B-12) 2500 MCG SUBL Place 2 each under the tongue daily.      Historical Provider, MD  Dulaglutide (TRULICITY) 8.00 LK/9.1PH SOPN Inject 0.75 mg weekly under skin 07/27/16   Philemon Kingdom, MD  glucose blood (ACCU-CHEK GUIDE) test strip Use as instructed to check sugar 3 times daily 10/10/16   Philemon Kingdom, MD  HUMALOG KWIKPEN 100 UNIT/ML KiwkPen INJECT 10-15 UNITS 3 TIMES DAILY 15 MINUTES BEFORE MEALS 04/27/16   Biagio Borg, MD  Insulin Glargine (LANTUS SOLOSTAR) 100 UNIT/ML Solostar Pen Inject 50 Units into the skin daily at 10 pm. 07/13/16   Philemon Kingdom, MD  insulin lispro (HUMALOG KWIKPEN) 100 UNIT/ML KiwkPen INJECT 15-30 UNITS 3 TIMES DAILY 15 MINUTES BEFORE MEALS 03/12/16   Philemon Kingdom, MD  Lancets (ACCU-CHEK MULTICLIX) lancets Use as instructed to check sugar 3 times daily  10/10/16   Philemon Kingdom, MD  losartan (COZAAR) 50 MG tablet Take 50 mg by mouth daily.    Historical Provider, MD  Magnesium 250 MG TABS Take by mouth.    Historical Provider, MD  OVER THE COUNTER MEDICATION Garcinia Cambogia with green coffee.    Historical Provider, MD  pantoprazole (PROTONIX) 40 MG tablet Take 1 tablet (40 mg total) by mouth 2 (two) times daily before a meal. Patient not taking: Reported on 08/15/2016 03/18/15   Tanda Rockers, MD  PINE BARK,  PYCNOGENOL, PO Take 1 capsule by mouth.    Historical Provider, MD  rosuvastatin (CRESTOR) 20 MG tablet Take 1 tablet (20 mg total) by mouth daily. Patient not taking: Reported on 08/15/2016 09/03/15   Biagio Borg, MD  sertraline (ZOLOFT) 100 MG tablet TAKE 2 TABLET BY MOUTH DAILY 10/08/16   Biagio Borg, MD  TRUEPLUS LANCETS 33G MISC Use to test blood sugar 3 times daily. Dx: E11.65 10/27/15   Philemon Kingdom, MD  TURMERIC PO Take 1 capsule by mouth daily.    Historical Provider, MD  UNIFINE PENTIPS 31G X 8 MM MISC USE 4 TIMES DAILY 08/02/14   Doe-Hyun Kyra Searles, DO   Family History Family History  Problem Relation Age of Onset  . Diabetes Mother   . Heart disease Mother   . Allergies Mother   . Asthma Mother   . Deep vein thrombosis Mother    Social History Social History  Substance Use Topics  . Smoking status: Former Smoker    Packs/day: 0.25    Years: 9.00    Types: Cigarettes    Quit date: 08/27/1984  . Smokeless tobacco: Never Used  . Alcohol use 0.0 oz/week     Comment: rare   Allergies   Metformin and related  Review of Systems Review of Systems  Constitutional: Positive for fever.  Respiratory: Negative for shortness of breath.   Cardiovascular: Negative for chest pain.  Gastrointestinal: Positive for abdominal pain, diarrhea, nausea and vomiting. Negative for blood in stool.  All other systems reviewed and are negative.  Physical Exam Updated Vital Signs BP 113/67 (BP Location: Right Arm)   Pulse 88   Temp 100.2 F (37.9 C) (Oral)   Resp 17   SpO2 96%   Physical Exam  Constitutional: She appears well-developed and well-nourished.  HENT:  Head: Normocephalic.  Right Ear: External ear normal.  Left Ear: External ear normal.  Nose: Nose normal.  Mouth/Throat: Oropharynx is clear and moist.  Eyes: Conjunctivae are normal. Right eye exhibits no discharge. Left eye exhibits no discharge.  Neck: Normal range of motion.  Cardiovascular: Normal rate, regular rhythm  and normal heart sounds.   No murmur heard. Pulmonary/Chest: Effort normal and breath sounds normal. No respiratory distress. She has no wheezes. She has no rales.  Abdominal: Soft. She exhibits no distension. There is tenderness. There is no rebound and no guarding.  There is epigastric and RUQ tenderness. No rebound or guarding.   Musculoskeletal: Normal range of motion. She exhibits no edema or tenderness.  Neurological: She is alert. No cranial nerve deficit. Coordination normal.  Skin: Skin is warm and dry. No rash noted. No erythema. No pallor.  Psychiatric: She has a normal mood and affect. Her behavior is normal.  Nursing note and vitals reviewed.  ED Treatments / Results  DIAGNOSTIC STUDIES: Oxygen Saturation is 96% on RA, normal by my interpretation.   COORDINATION OF CARE: 6:10 PM-Discussed next steps with pt. Pt  verbalized understanding and is agreeable with the plan.   Labs (all labs ordered are listed, but only abnormal results are displayed) Labs Reviewed  LIPASE, BLOOD - Abnormal; Notable for the following:       Result Value   Lipase <10 (*)    All other components within normal limits  COMPREHENSIVE METABOLIC PANEL - Abnormal; Notable for the following:    Sodium 132 (*)    Chloride 98 (*)    Glucose, Bld 350 (*)    Calcium 8.1 (*)    Albumin 3.0 (*)    ALT 11 (*)    GFR calc non Af Amer 58 (*)    All other components within normal limits  CBC - Abnormal; Notable for the following:    WBC 16.1 (*)    All other components within normal limits  CBG MONITORING, ED - Abnormal; Notable for the following:    Glucose-Capillary 334 (*)    All other components within normal limits  URINALYSIS, ROUTINE W REFLEX MICROSCOPIC   EKG  EKG Interpretation None      Radiology Ct Abdomen Pelvis W Contrast  Result Date: 11/04/2016 CLINICAL DATA:  Vomiting, diarrhea EXAM: CT ABDOMEN AND PELVIS WITH CONTRAST TECHNIQUE: Multidetector CT imaging of the abdomen and  pelvis was performed using the standard protocol following bolus administration of intravenous contrast. CONTRAST:  151m ISOVUE-300 IOPAMIDOL (ISOVUE-300) INJECTION 61% COMPARISON:  None. FINDINGS: Lower chest: Lung bases are clear. Hepatobiliary: Liver is within normal limits. Multiple gallstones with gallbladder wall thickening and surrounding inflammatory changes, worrisome for acute cholecystitis. No intrahepatic or extrahepatic ductal dilatation. Pancreas: Within normal limits. Spleen: Within normal limits. Adrenals/Urinary Tract: Adrenal glands are within normal limits. Kidneys are within normal limits.  No hydronephrosis. Bladder is underdistended but unremarkable. Stomach/Bowel: Stomach is notable for a tiny hiatal hernia. No evidence of bowel obstruction. Normal appendix (series 201/ image 66). Vascular/Lymphatic: No evidence of abdominal aortic aneurysm. Atherosclerotic calcifications of the abdominal aorta and branch vessels. No suspicious abdominopelvic lymphadenopathy. Reproductive: Uterus is grossly unremarkable. Bilateral ovaries are unremarkable. Other: No abdominopelvic ascites. Musculoskeletal: Visualized osseous structures are within normal limits. IMPRESSION: Cholelithiasis with findings suspicious for acute cholecystitis. These results were called by telephone at the time of interpretation on 11/04/2016 at 9:57 pm to Dr. SVirgel Manifold, who verbally acknowledged these results. Electronically Signed   By: SJulian HyM.D.   On: 11/04/2016 21:57    Procedures Procedures   Medications Ordered in ED Medications - No data to display  Initial Impression / Assessment and Plan / ED Course  I have reviewed the triage vital signs and the nursing notes.  Pertinent labs & imaging results that were available during my care of the patient were reviewed by me and considered in my medical decision making (see chart for details).     66year old female with about a week's worth of upper  abdominal pain and nausea/vomiting/diarrhea. Reports temp of up to 103. 100.2 in ED after tylenol at urgent prior to arrival. CT significant for acute cholecystitis. Multiple stones in the gallbladder. The gallbladder is large, has a thickened wall and pericholecystic fluid. Abx. Surgery consultation.   Final Clinical Impressions(s) / ED Diagnoses   Final diagnoses:  Acute calculous cholecystitis   New Prescriptions New Prescriptions   No medications on file   I personally preformed the services scribed in my presence. The recorded information has been reviewed is accurate. SVirgel Manifold MD.    SVirgel Manifold MD 11/13/16 [phone]

## 2016-11-04 NOTE — ED Notes (Signed)
Fluid bolus complete  

## 2016-11-04 NOTE — ED Notes (Signed)
Contacted CT regarding pt scan, per CT pt is next to be transported.

## 2016-11-04 NOTE — ED Provider Notes (Signed)
CSN: [id number]     Arrival date & time 11/04/16  1253 History   First MD Initiated Contact with Patient 11/04/16 1344     Chief Complaint  Patient presents with  . Emesis  . Diarrhea   (Consider location/radiation/quality/duration/timing/severity/associated sxs/prior Treatment) HPI  Past Medical History:  Diagnosis Date  . Depression   . Diabetes mellitus    T2DM  . Hyperlipidemia   . Obesity (BMI 30-39.9)    History reviewed. No pertinent surgical history. Family History  Problem Relation Age of Onset  . Diabetes Mother   . Heart disease Mother   . Allergies Mother   . Asthma Mother   . Deep vein thrombosis Mother    Social History  Substance Use Topics  . Smoking status: Former Smoker    Packs/day: 0.25    Years: 9.00    Types: Cigarettes    Quit date: 08/27/1984  . Smokeless tobacco: Never Used  . Alcohol use 0.0 oz/week     Comment: rare   OB History    No data available     Review of Systems  Allergies  Metformin and related  Home Medications   Prior to Admission medications   Medication Sig Start Date End Date Taking? Authorizing Provider  ARIPiprazole (ABILIFY) 5 MG tablet 1/2 tab by mouth once per day 03/01/16  Yes Biagio Borg, MD  Blood Glucose Monitoring Suppl (ACCU-CHEK GUIDE) w/Device KIT 1 each by Does not apply route daily. 10/10/16  Yes Philemon Kingdom, MD  Cholecalciferol (VITAMIN D3) 400 UNITS CAPS Take 8 capsules by mouth.   Yes Historical Provider, MD  Cyanocobalamin (VITAMIN B-12) 2500 MCG SUBL Place 2 each under the tongue daily.     Yes Historical Provider, MD  glucose blood (ACCU-CHEK GUIDE) test strip Use as instructed to check sugar 3 times daily 10/10/16  Yes Philemon Kingdom, MD  HUMALOG KWIKPEN 100 UNIT/ML KiwkPen INJECT 10-15 UNITS 3 TIMES DAILY 15 MINUTES BEFORE MEALS 04/27/16  Yes Biagio Borg, MD  Insulin Glargine (LANTUS SOLOSTAR) 100 UNIT/ML Solostar Pen Inject 50 Units into the skin daily at 10 pm. 07/13/16  Yes Philemon Kingdom,  MD  Lancets (ACCU-CHEK MULTICLIX) lancets Use as instructed to check sugar 3 times daily 10/10/16  Yes Philemon Kingdom, MD  TRUEPLUS LANCETS 33G MISC Use to test blood sugar 3 times daily. Dx: E11.65 10/27/15  Yes Philemon Kingdom, MD  UNIFINE PENTIPS 31G X 8 MM MISC USE 4 TIMES DAILY 08/02/14  Yes Doe-Hyun Kyra Searles, DO  Adenosine Phosphate (ADENOSINE-5-MONOPHOSPHATE) POWD 1 capsule by Does not apply route.    Historical Provider, MD  Cinnamon 500 MG capsule Take 500 mg by mouth daily.    Historical Provider, MD  Dulaglutide (TRULICITY) 6.75 QG/9.2EF SOPN Inject 0.75 mg weekly under skin 07/27/16   Philemon Kingdom, MD  insulin lispro (HUMALOG KWIKPEN) 100 UNIT/ML KiwkPen INJECT 15-30 UNITS 3 TIMES DAILY 15 MINUTES BEFORE MEALS 03/12/16   Philemon Kingdom, MD  losartan (COZAAR) 50 MG tablet Take 50 mg by mouth daily.    Historical Provider, MD  Magnesium 250 MG TABS Take by mouth.    Historical Provider, MD  OVER THE COUNTER MEDICATION Garcinia Cambogia with green coffee.    Historical Provider, MD  pantoprazole (PROTONIX) 40 MG tablet Take 1 tablet (40 mg total) by mouth 2 (two) times daily before a meal. Patient not taking: Reported on 08/15/2016 03/18/15   Tanda Rockers, MD  PINE BARK, PYCNOGENOL, PO Take 1 capsule by mouth.  Historical Provider, MD  rosuvastatin (CRESTOR) 20 MG tablet Take 1 tablet (20 mg total) by mouth daily. Patient not taking: Reported on 08/15/2016 09/03/15   Biagio Borg, MD  sertraline (ZOLOFT) 100 MG tablet TAKE 2 TABLET BY MOUTH DAILY 10/08/16   Biagio Borg, MD  TURMERIC PO Take 1 capsule by mouth daily.    Historical Provider, MD   Meds Ordered and Administered this Visit   Medications  0.9 %  sodium chloride infusion ( Intravenous Stopped 11/04/16 1504)  acetaminophen (TYLENOL) tablet 975 mg (975 mg Oral Given 11/04/16 1439)  ondansetron (ZOFRAN) injection 4 mg (4 mg Intravenous Given 11/04/16 1438)    BP 156/64 (BP Location: Left Arm)   Pulse 95   Temp 100.2 F  (37.9 C) (Oral)   Resp 16   SpO2 100%  No data found.   Physical Exam  Constitutional: She is oriented to person, place, and time.  Acutely ill elderly female in NAD  HENT:  Head: Normocephalic and atraumatic.  Right Ear: External ear normal.  Left Ear: External ear normal.  Mouth/Throat: Oropharynx is clear and moist.  Eyes: Conjunctivae and EOM are normal. Pupils are equal, round, and reactive to light.  Neck: Normal range of motion. Neck supple.  Cardiovascular: Normal rate, regular rhythm and normal heart sounds.   Pulmonary/Chest: Effort normal and breath sounds normal.  Abdominal: There is tenderness.  Temder periumbilical and RLQ with some guarding  Neurological: She is alert and oriented to person, place, and time.  Nursing note and vitals reviewed.   Urgent Care Course     Procedures (including critical care time)  Labs Review Labs Reviewed  POCT I-STAT, CHEM 8 - Abnormal; Notable for the following:       Result Value   Sodium 134 (*)    Chloride 95 (*)    Glucose, Bld 330 (*)    Calcium, Ion 1.08 (*)    All other components within normal limits    Imaging Review No results found.   Visual Acuity Review  Right Eye Distance:   Left Eye Distance:   Bilateral Distance:    Right Eye Near:   Left Eye Near:    Bilateral Near:         MDM   1. Intractable cyclical vomiting with nausea   2. Fever chills   3. Lower abdominal pain   4. Hyperglycemia    UA IV and one liter NS  Zofran 64m IV  After IV patient is still a little orthostatic and  hurting quite a bit in lowe abdomen  And unable to void.  Will transfer to ED for higher level of care.    WLysbeth Penner FNP 11/04/16 1537

## 2016-11-05 ENCOUNTER — Inpatient Hospital Stay (HOSPITAL_COMMUNITY): Payer: 59 | Admitting: Anesthesiology

## 2016-11-05 ENCOUNTER — Inpatient Hospital Stay (HOSPITAL_COMMUNITY): Payer: 59

## 2016-11-05 ENCOUNTER — Encounter (HOSPITAL_COMMUNITY): Payer: Self-pay | Admitting: Anesthesiology

## 2016-11-05 ENCOUNTER — Encounter (HOSPITAL_COMMUNITY)
Admission: EM | Disposition: A | Discharge status: Home / Self Care | Payer: Self-pay | Source: Home / Self Care | Attending: Internal Medicine

## 2016-11-05 DIAGNOSIS — R9431 Abnormal electrocardiogram [ECG] [EKG]: Secondary | ICD-10-CM

## 2016-11-05 HISTORY — PX: CHOLECYSTECTOMY: SHX55

## 2016-11-05 LAB — COMPREHENSIVE METABOLIC PANEL
ALBUMIN: 2.8 g/dL — AB (ref 3.5–5.0)
ALT: 13 U/L — ABNORMAL LOW (ref 14–54)
AST: 19 U/L (ref 15–41)
Alkaline Phosphatase: 71 U/L (ref 38–126)
Anion gap: 8 (ref 5–15)
BUN: 17 mg/dL (ref 6–20)
CHLORIDE: 102 mmol/L (ref 101–111)
CO2: 23 mmol/L (ref 22–32)
Calcium: 7.9 mg/dL — ABNORMAL LOW (ref 8.9–10.3)
Creatinine, Ser: 0.93 mg/dL (ref 0.44–1.00)
GFR calc Af Amer: >60 mL/min (ref >60)
GFR calc non Af Amer: >60 mL/min (ref >60)
Glucose, Bld: 239 mg/dL — ABNORMAL HIGH (ref 65–99)
POTASSIUM: 3.4 mmol/L — AB (ref 3.5–5.1)
SODIUM: 133 mmol/L — AB (ref 135–145)
Total Bilirubin: 0.9 mg/dL (ref 0.3–1.2)
Total Protein: 6.3 g/dL — ABNORMAL LOW (ref 6.5–8.1)

## 2016-11-05 LAB — GLUCOSE, CAPILLARY
GLUCOSE-CAPILLARY: 216 mg/dL — AB (ref 65–99)
GLUCOSE-CAPILLARY: 187 mg/dL — AB (ref 65–99)
GLUCOSE-CAPILLARY: 160 mg/dL — AB (ref 65–99)
Glucose-Capillary: 178 mg/dL — ABNORMAL HIGH (ref 65–99)
Glucose-Capillary: 240 mg/dL — ABNORMAL HIGH (ref 65–99)
Glucose-Capillary: 237 mg/dL — ABNORMAL HIGH (ref 65–99)

## 2016-11-05 LAB — MAGNESIUM: MAGNESIUM: 1.7 mg/dL (ref 1.7–2.4)

## 2016-11-05 LAB — CBC
HCT: 35.1 % — ABNORMAL LOW (ref 36.0–46.0)
Hemoglobin: 11.4 g/dL — ABNORMAL LOW (ref 12.0–15.0)
MCH: 27.9 pg (ref 26.0–34.0)
MCHC: 32.5 g/dL (ref 30.0–36.0)
MCV: 86 fL (ref 78.0–100.0)
PLATELETS: 254 10*3/uL (ref 150–400)
RBC: 4.08 MIL/uL (ref 3.87–5.11)
RDW: 12.8 % (ref 11.5–15.5)
WBC: 18.1 10*3/uL — AB (ref 4.0–10.5)

## 2016-11-05 LAB — PROTIME-INR
INR: 1.21
PROTHROMBIN TIME: 15.4 s — AB (ref 11.4–15.2)

## 2016-11-05 LAB — CBG MONITORING, ED: Glucose-Capillary: 154 mg/dL — ABNORMAL HIGH (ref 65–99)

## 2016-11-05 LAB — SURGICAL PCR SCREEN
MRSA, PCR: NEGATIVE
STAPHYLOCOCCUS AUREUS: NEGATIVE

## 2016-11-05 SURGERY — LAPAROSCOPIC CHOLECYSTECTOMY
Anesthesia: General | Site: Abdomen

## 2016-11-05 MED ORDER — MIDAZOLAM HCL 2 MG/2ML IJ SOLN
INTRAMUSCULAR | Status: AC
  Filled 2016-11-05: qty 2

## 2016-11-05 MED ORDER — HEMOSTATIC AGENTS (NO CHARGE) OPTIME
TOPICAL | Status: DC | PRN
  Administered 2016-11-05: 1

## 2016-11-05 MED ORDER — BUPIVACAINE HCL (PF) 0.25 % IJ SOLN
INTRAMUSCULAR | Status: DC | PRN
  Administered 2016-11-05: 11 mL

## 2016-11-05 MED ORDER — PROPOFOL 10 MG/ML IV BOLUS
INTRAVENOUS | Status: DC | PRN
  Administered 2016-11-05: 150 mg via INTRAVENOUS

## 2016-11-05 MED ORDER — MEPERIDINE HCL 25 MG/ML IJ SOLN: 6.2500 mg | INTRAMUSCULAR | Status: DC | PRN

## 2016-11-05 MED ORDER — SODIUM CHLORIDE 0.9 % IV SOLN
INTRAVENOUS | Status: DC
  Administered 2016-11-06 (×2): via INTRAVENOUS

## 2016-11-05 MED ORDER — ONDANSETRON HCL 4 MG/2ML IJ SOLN
INTRAMUSCULAR | Status: AC
  Filled 2016-11-05: qty 2

## 2016-11-05 MED ORDER — KETOROLAC TROMETHAMINE 30 MG/ML IJ SOLN
30.0000 mg | Freq: Once | INTRAMUSCULAR | Status: AC
  Administered 2016-11-05: 30 mg via INTRAVENOUS

## 2016-11-05 MED ORDER — PIPERACILLIN-TAZOBACTAM 3.375 G IVPB 30 MIN
3.3750 g | INTRAVENOUS | Status: DC
  Filled 2016-11-05 (×2): qty 50

## 2016-11-05 MED ORDER — INSULIN GLARGINE 100 UNIT/ML ~~LOC~~ SOLN
25.0000 [IU] | Freq: Once | SUBCUTANEOUS | Status: DC
  Filled 2016-11-05: qty 0.25

## 2016-11-05 MED ORDER — ROCURONIUM BROMIDE 50 MG/5ML IV SOSY
PREFILLED_SYRINGE | INTRAVENOUS | Status: AC
  Filled 2016-11-05: qty 5

## 2016-11-05 MED ORDER — POTASSIUM CHLORIDE CRYS ER 20 MEQ PO TBCR
40.0000 meq | EXTENDED_RELEASE_TABLET | Freq: Once | ORAL | Status: AC
  Administered 2016-11-05: 40 meq via ORAL
  Filled 2016-11-05: qty 2

## 2016-11-05 MED ORDER — ROCURONIUM BROMIDE 50 MG/5ML IV SOSY
PREFILLED_SYRINGE | INTRAVENOUS | Status: AC
  Filled 2016-11-05: qty 10

## 2016-11-05 MED ORDER — ROCURONIUM BROMIDE 100 MG/10ML IV SOLN
INTRAVENOUS | Status: DC | PRN
  Administered 2016-11-05: 40 mg via INTRAVENOUS
  Administered 2016-11-05 (×3): 10 mg via INTRAVENOUS

## 2016-11-05 MED ORDER — FENTANYL CITRATE (PF) 100 MCG/2ML IJ SOLN
25.0000 ug | INTRAMUSCULAR | Status: DC | PRN
  Administered 2016-11-05 (×2): 50 ug via INTRAVENOUS

## 2016-11-05 MED ORDER — SODIUM CHLORIDE 0.9 % IR SOLN
Status: DC | PRN
  Administered 2016-11-05: 1000 mL
  Administered 2016-11-05: 3000 mL

## 2016-11-05 MED ORDER — PHENYLEPHRINE 40 MCG/ML (10ML) SYRINGE FOR IV PUSH (FOR BLOOD PRESSURE SUPPORT)
PREFILLED_SYRINGE | INTRAVENOUS | Status: AC
  Filled 2016-11-05: qty 10

## 2016-11-05 MED ORDER — PROPOFOL 10 MG/ML IV BOLUS
INTRAVENOUS | Status: AC
  Filled 2016-11-05: qty 20

## 2016-11-05 MED ORDER — FENTANYL CITRATE (PF) 100 MCG/2ML IJ SOLN
INTRAMUSCULAR | Status: AC
  Filled 2016-11-05: qty 2

## 2016-11-05 MED ORDER — ONDANSETRON HCL 4 MG/2ML IJ SOLN: 4.0000 mg | Freq: Once | INTRAMUSCULAR | Status: DC | PRN

## 2016-11-05 MED ORDER — LIDOCAINE 2% (20 MG/ML) 5 ML SYRINGE
INTRAMUSCULAR | Status: AC
  Filled 2016-11-05: qty 5

## 2016-11-05 MED ORDER — LACTATED RINGERS IV SOLN
INTRAVENOUS | Status: DC | PRN
  Administered 2016-11-05: 15:00:00 via INTRAVENOUS

## 2016-11-05 MED ORDER — 0.9 % SODIUM CHLORIDE (POUR BTL) OPTIME
TOPICAL | Status: DC | PRN
  Administered 2016-11-05: 1000 mL

## 2016-11-05 MED ORDER — DEXAMETHASONE SODIUM PHOSPHATE 10 MG/ML IJ SOLN
INTRAMUSCULAR | Status: AC
  Filled 2016-11-05: qty 1

## 2016-11-05 MED ORDER — BUPIVACAINE HCL (PF) 0.25 % IJ SOLN
INTRAMUSCULAR | Status: AC
  Filled 2016-11-05: qty 30

## 2016-11-05 MED ORDER — MAGNESIUM SULFATE IN D5W 1-5 GM/100ML-% IV SOLN
1.0000 g | Freq: Once | INTRAVENOUS | Status: AC
  Administered 2016-11-05: 1 g via INTRAVENOUS
  Filled 2016-11-05: qty 100

## 2016-11-05 MED ORDER — MIDAZOLAM HCL 5 MG/5ML IJ SOLN
INTRAMUSCULAR | Status: DC | PRN
  Administered 2016-11-05 (×2): 1 mg via INTRAVENOUS

## 2016-11-05 MED ORDER — FENTANYL CITRATE (PF) 100 MCG/2ML IJ SOLN
INTRAMUSCULAR | Status: DC | PRN
  Administered 2016-11-05 (×4): 50 ug via INTRAVENOUS

## 2016-11-05 MED ORDER — LIDOCAINE HCL (CARDIAC) 20 MG/ML IV SOLN
INTRAVENOUS | Status: DC | PRN
  Administered 2016-11-05: 50 mg via INTRAVENOUS

## 2016-11-05 MED ORDER — FENTANYL CITRATE (PF) 100 MCG/2ML IJ SOLN
INTRAMUSCULAR | Status: AC
  Administered 2016-11-05: 50 ug via INTRAVENOUS
  Filled 2016-11-05: qty 2

## 2016-11-05 MED ORDER — KETOROLAC TROMETHAMINE 30 MG/ML IJ SOLN
INTRAMUSCULAR | Status: AC
  Administered 2016-11-05: 30 mg via INTRAVENOUS
  Filled 2016-11-05: qty 1

## 2016-11-05 MED ORDER — SUGAMMADEX SODIUM 200 MG/2ML IV SOLN
INTRAVENOUS | Status: DC | PRN
  Administered 2016-11-05: 200 mg via INTRAVENOUS

## 2016-11-05 SURGICAL SUPPLY — 54 items
ADH SKN CLS APL DERMABOND .7 (GAUZE/BANDAGES/DRESSINGS) ×1
APPLIER CLIP 5 13 M/L LIGAMAX5 (MISCELLANEOUS) ×2
APR CLP MED LRG 5 ANG JAW (MISCELLANEOUS) ×1
BAG SPEC RTRVL 10 TROC 200 (ENDOMECHANICALS) ×3
BIOPATCH RED 1 DISK 7.0 (GAUZE/BANDAGES/DRESSINGS) ×1 IMPLANT
BLADE CLIPPER SURG (BLADE) IMPLANT
CANISTER SUCT 3000ML PPV (MISCELLANEOUS) ×2 IMPLANT
CHLORAPREP W/TINT 26ML (MISCELLANEOUS) ×2 IMPLANT
CLIP APPLIE 5 13 M/L LIGAMAX5 (MISCELLANEOUS) ×1 IMPLANT
COVER SURGICAL LIGHT HANDLE (MISCELLANEOUS) ×2 IMPLANT
CUTTER FLEX LINEAR 45M (STAPLE) ×1 IMPLANT
DERMABOND ADVANCED (GAUZE/BANDAGES/DRESSINGS) ×1
DERMABOND ADVANCED .7 DNX12 (GAUZE/BANDAGES/DRESSINGS) ×1 IMPLANT
DEVICE TROCAR PUNCTURE CLOSURE (ENDOMECHANICALS) ×2 IMPLANT
DRAIN CHANNEL 19F RND (DRAIN) ×1 IMPLANT
DRSG TEGADERM 2-3/8X2-3/4 SM (GAUZE/BANDAGES/DRESSINGS) ×2 IMPLANT
ELECT REM PT RETURN 9FT ADLT (ELECTROSURGICAL) ×2
ELECTRODE REM PT RTRN 9FT ADLT (ELECTROSURGICAL) ×1 IMPLANT
ENDOLOOP SUT PDS II  0 18 (SUTURE) ×1
ENDOLOOP SUT PDS II 0 18 (SUTURE) IMPLANT
EVACUATOR SILICONE 100CC (DRAIN) ×1 IMPLANT
GLOVE BIO SURGEON STRL SZ7 (GLOVE) ×3 IMPLANT
GLOVE BIOGEL PI IND STRL 6.5 (GLOVE) IMPLANT
GLOVE BIOGEL PI IND STRL 7.0 (GLOVE) IMPLANT
GLOVE BIOGEL PI IND STRL 7.5 (GLOVE) ×1 IMPLANT
GLOVE BIOGEL PI INDICATOR 6.5 (GLOVE) ×1
GLOVE BIOGEL PI INDICATOR 7.0 (GLOVE) ×2
GLOVE BIOGEL PI INDICATOR 7.5 (GLOVE) ×1
GLOVE SURG SS PI 6.5 STRL IVOR (GLOVE) ×1 IMPLANT
GOWN STRL REUS W/ TWL LRG LVL3 (GOWN DISPOSABLE) ×3 IMPLANT
GOWN STRL REUS W/TWL LRG LVL3 (GOWN DISPOSABLE) ×6
HEMOSTAT SNOW SURGICEL 2X4 (HEMOSTASIS) ×1 IMPLANT
KIT BASIN OR (CUSTOM PROCEDURE TRAY) ×2 IMPLANT
KIT ROOM TURNOVER OR (KITS) ×2 IMPLANT
NS IRRIG 1000ML POUR BTL (IV SOLUTION) ×2 IMPLANT
PAD ARMBOARD 7.5X6 YLW CONV (MISCELLANEOUS) ×2 IMPLANT
POUCH RETRIEVAL ECOSAC 10 (ENDOMECHANICALS) ×1 IMPLANT
POUCH RETRIEVAL ECOSAC 10MM (ENDOMECHANICALS) ×3
RELOAD STAPLE 45 3.5 BLU ETS (ENDOMECHANICALS) IMPLANT
RELOAD STAPLE TA45 3.5 REG BLU (ENDOMECHANICALS) ×2 IMPLANT
SCISSORS LAP 5X35 DISP (ENDOMECHANICALS) ×2 IMPLANT
SET IRRIG TUBING LAPAROSCOPIC (IRRIGATION / IRRIGATOR) ×2 IMPLANT
SLEEVE ENDOPATH XCEL 5M (ENDOMECHANICALS) ×4 IMPLANT
SPECIMEN JAR SMALL (MISCELLANEOUS) ×2 IMPLANT
STRIP CLOSURE SKIN 1/2X4 (GAUZE/BANDAGES/DRESSINGS) ×2 IMPLANT
SUT ETHILON 2 0 FS 18 (SUTURE) ×1 IMPLANT
SUT MNCRL AB 4-0 PS2 18 (SUTURE) ×2 IMPLANT
SUT VICRYL 0 UR6 27IN ABS (SUTURE) ×3 IMPLANT
TOWEL OR 17X24 6PK STRL BLUE (TOWEL DISPOSABLE) ×2 IMPLANT
TOWEL OR 17X26 10 PK STRL BLUE (TOWEL DISPOSABLE) ×2 IMPLANT
TRAY LAPAROSCOPIC MC (CUSTOM PROCEDURE TRAY) ×2 IMPLANT
TROCAR XCEL BLUNT TIP 100MML (ENDOMECHANICALS) ×2 IMPLANT
TROCAR XCEL NON-BLD 5MMX100MML (ENDOMECHANICALS) ×2 IMPLANT
TUBING INSUFFLATION (TUBING) ×2 IMPLANT

## 2016-11-05 NOTE — ED Notes (Signed)
Attempted to call report

## 2016-11-05 NOTE — Transfer of Care (Signed)
Immediate Anesthesia Transfer of Care Note  Patient: Cassandra Morris  Procedure(s) Performed: Procedure(s): LAPAROSCOPIC CHOLECYSTECTOMY (N/A)  Patient Location: PACU  Anesthesia Type:General  Level of Consciousness: awake, alert , patient cooperative and responds to stimulation  Airway & Oxygen Therapy: Patient Spontanous Breathing and Patient connected to nasal cannula oxygen  Post-op Assessment: Report given to RN and Post -op Vital signs reviewed and stable  Post vital signs: Reviewed  Last Vitals: 151/68, 96,18, 96% Vitals:   11/05/16 0042 11/05/16 0401  BP: (!) 152/50 (!) 142/53  Pulse: 82 83  Resp: 17 18  Temp: (!) 38.3 C 37.7 C    Last Pain:  Vitals:   11/05/16 0401  TempSrc: Oral  PainSc:          Complications: No apparent anesthesia complications

## 2016-11-05 NOTE — Op Note (Signed)
Preoperative diagnosis: acute cholecysititis Postoperative diagnosis: gangrenous cholecystitis Procedure: Laparoscopic subtotal cholecystectomy Surgeon: Dr. Serita Grammes Anesthesia: Gen. Specimens: Portion of gb and stones to pathology Estimated blood loss: 50 mL Complications: None Drains: 19 [place] Blake drain to gallbladder fossa Sponge count was correct at completion Disposition to recovery stable  Indications: This is a 66 yo diabetic female with one week abdominal pain and cholecystitis by [place] and by exam. I discussed surgery today.   Procedure: After informed consent was obtained the patient was taken to the operating room. She was given antibiotics. SCDs were in place. She was placed under general anesthesia without complication. Her abdomen was prepped and draped in the standard sterile surgical fashion. A surgical timeout was then performed.  I then infiltrated Marcaine below the umbilicus. I made a vertical incision. I identified the fascia incised sharply. I entered into the peritoneum bluntly. There is no evidence of an entry injury. I then placed a 0 Vicryl pursestring suture. I then inserted a Hassan trocar and insufflated to 15 mmHg pressure.  I then placed a 5 mm trocar and epigastrium which was later sized to an 11 mm trocar. 2 5 mm trocars were placed in the right side of the abdomen. I grasped the gallbladder and retracted cephalad. I began to dissect the triangle of calot and it became apparent quickly that it appeared that the gallbladder was adherent to the bile duct. I thought I identified the cystic duct but the gallbladder cephalad to that was fused to the common hepatic duct it appeared. I was able to identify the cystic artery well up on the gallbladder and clipped and divided that.  I elected at this point to convert to a subtotal cholecystectomy. I did not think opening would be helpful and I was concerned about the adherence to the common duct.  I elected at that  point to take the gallbladder from the dome down. I used cautery to release this all the way down to where it appeared safe. I removed this portion of the gallbladder. I irrigated inside and removed all of the stones from the inside of the gallbladder. I then used an endoloop to close the gallbladder remnant which is not as small as I would have liked. She certainly has possibility of recurrent disease but I think this was only option given the gallbladder was dead, surrounding inflammation and adherence to common duct. I then elected to place a 63 [place] Blake drain and secured this with a 2-0 nylon. I placed a piece of Surgicel in the liver bed overlying this. I then removed the [facility] trocar and tied the pursestring down. I did place an additional 2-0 Vicryl sutures at this area. I closed the epigastric fascia with 2-0- vicryl.  I then removed the remaining trocars and these were closed with 4-0 Monocryl and glue. She tolerated this well be transferred recovery room.

## 2016-11-05 NOTE — Progress Notes (Signed)
[facility] Progress Note     Subjective: Abdominal pain improved since admission. No acute events overnight. No fever or chills. Had a normal BM today no blood.   Objective: Vital signs in last 24 hours: Temp:  [99.9 F (37.7 C)-100.9 F (38.3 C)] 99.9 F (37.7 C) (03/12 0401) Pulse Rate:  [72-95] 83 (03/12 0401) Resp:  [16-18] 18 (03/12 0401) BP: (113-156)/(50-67) 142/53 (03/12 0401) SpO2:  [92 %-100 %] 97 % (03/12 0401) Weight:  [213 lb 13.5 oz (97 kg)] 213 lb 13.5 oz (97 kg) (03/12 0042) Last BM Date: 11/04/16  Intake/Output from previous day: 03/11 0701 - 03/12 0700 In: 464.6 [I.V.:464.6] Out: 400 [Urine:400] Intake/Output this shift: No intake/output data recorded.  PE: Gen:  Alert, NAD, pleasant, cooperative, well appearing Card:  RRR, no M/G/R heard Pulm:  Rate and effort normal Abd: Soft, obese, nondistended, +BS, TTP RUQ and epigastric region Skin: no rashes noted, warm and dry  Lab Results:   Recent Labs  11/04/16 1600 11/05/16 0408  WBC 16.1* 18.1*  HGB 12.5 11.4*  HCT 38.0 35.1*  PLT 250 254   BMET  Recent Labs  11/04/16 1600 11/05/16 0408  NA 132* 133*  K 3.6 3.4*  CL 98* 102  CO2 22 23  GLUCOSE 350* 239*  BUN 17 17  CREATININE 1.00 0.93  CALCIUM 8.1* 7.9*   PT/INR  Recent Labs  11/04/16 2343  LABPROT 15.4*  INR 1.21   CMP     Component Value Date/Time   NA 133 (L) 11/05/2016 0408   K 3.4 (L) 11/05/2016 0408   CL 102 11/05/2016 0408   CO2 23 11/05/2016 0408   GLUCOSE 239 (H) 11/05/2016 0408   BUN 17 11/05/2016 0408   CREATININE 0.93 11/05/2016 0408   CALCIUM 7.9 (L) 11/05/2016 0408   PROT 6.3 (L) 11/05/2016 0408   ALBUMIN 2.8 (L) 11/05/2016 0408   AST 19 11/05/2016 0408   ALT 13 (L) 11/05/2016 0408   ALKPHOS 71 11/05/2016 0408   BILITOT 0.9 11/05/2016 0408   GFRNONAA >60 11/05/2016 0408   GFRAA >60 11/05/2016 0408   Lipase     Component Value Date/Time   LIPASE <10 (L) 11/04/2016 1600        Studies/Results: Ct Abdomen Pelvis W Contrast  Result Date: 11/04/2016 CLINICAL DATA:  Vomiting, diarrhea EXAM: CT ABDOMEN AND PELVIS WITH CONTRAST TECHNIQUE: Multidetector CT imaging of the abdomen and pelvis was performed using the standard protocol following bolus administration of intravenous contrast. CONTRAST:  187mL ISOVUE-300 IOPAMIDOL (ISOVUE-300) INJECTION 61% COMPARISON:  None. FINDINGS: Lower chest: Lung bases are clear. Hepatobiliary: Liver is within normal limits. Multiple gallstones with gallbladder wall thickening and surrounding inflammatory changes, worrisome for acute cholecystitis. No intrahepatic or extrahepatic ductal dilatation. Pancreas: Within normal limits. Spleen: Within normal limits. Adrenals/Urinary Tract: Adrenal glands are within normal limits. Kidneys are within normal limits.  No hydronephrosis. Bladder is underdistended but unremarkable. Stomach/Bowel: Stomach is notable for a tiny hiatal hernia. No evidence of bowel obstruction. Normal appendix (series 201/ image 66). Vascular/Lymphatic: No evidence of abdominal aortic aneurysm. Atherosclerotic calcifications of the abdominal aorta and branch vessels. No suspicious abdominopelvic lymphadenopathy. Reproductive: Uterus is grossly unremarkable. Bilateral ovaries are unremarkable. Other: No abdominopelvic ascites. Musculoskeletal: Visualized osseous structures are within normal limits. IMPRESSION: Cholelithiasis with findings suspicious for acute cholecystitis. These results were called by telephone at the time of interpretation on 11/04/2016 at 9:57 pm to Dr. Virgel Manifold , who verbally acknowledged these results. Electronically Signed  By: Julian Hy M.D.   On: 11/04/2016 21:57    Anti-infectives: Anti-infectives    Start     Dose/Rate Route Frequency Ordered Stop   11/05/16 0600  piperacillin-tazobactam (ZOSYN) IVPB 3.375 g     3.375 g 12.5 mL/hr over 240 Minutes Intravenous Every 8 hours 11/04/16  2321     11/04/16 2215  piperacillin-tazobactam (ZOSYN) IVPB 3.375 g     3.375 g 100 mL/hr over 30 Minutes Intravenous  Once 11/04/16 2202 11/04/16 2252       Assessment/Plan  DM- uncontrolled with neuropathy HTN Depression  Acute cholecystitis with calculous - WBC trending up, 18.1 - NPO, IVF, Zosyn - US showed cholelithiasis with largest stone measuring 2.5cmn with gallbladder wall thickening. + murphy's sign  Likely surgery today or tomorrow. Keep pt NPO until time determined. Will discuss timing with MD   LOS: 1 day    Kalman Drape , [facility] 11/05/2016, 8:01 AM Pager: [phone] Consults: [phone] Mon-Fri 7:00 am-4:30 pm Sat-Sun 7:00 am-11:30 am

## 2016-11-05 NOTE — Anesthesia Procedure Notes (Signed)
Procedure Name: Intubation Date/Time: 11/05/2016 3:02 PM Performed by: Marchelle Folks ANN Pre-anesthesia Checklist: Patient identified, Emergency Drugs available, Suction available, Patient being monitored and Timeout performed Patient Re-evaluated:Patient Re-evaluated prior to inductionOxygen Delivery Method: Circle system utilized Preoxygenation: Pre-oxygenation with 100% oxygen Intubation Type: IV induction Ventilation: Mask ventilation without difficulty Laryngoscope Size: Glidescope and 3 Grade View: Grade I Tube type: Oral Tube size: 7.0 mm Number of attempts: 1 Airway Equipment and Method: Rigid stylet Placement Confirmation: ETT inserted through vocal cords under direct vision,  positive ETCO2 and breath sounds checked- equal and bilateral Secured at: 21 cm Tube secured with: Tape Dental Injury: Teeth and Oropharynx as per pre-operative assessment

## 2016-11-05 NOTE — Progress Notes (Signed)
1850 Received pt back PACU, drowsy. Lap sites w/ dermabond dry and intact. Denies pain at this time.

## 2016-11-05 NOTE — Care Management Note (Signed)
Case Management Note  Patient Details  Name: IREAN KENDRICKS MRN: [id number] Date of Birth: 19-Jan-1951  Subjective/Objective:                    Action/Plan:   Expected Discharge Date:                  Expected Discharge Plan:  Home/Self Care  In-House Referral:     Discharge planning Services     Post Acute Care Choice:    Choice offered to:     DME Arranged:    DME Agency:     HH Arranged:    HH Agency:     Status of Service:  In process, will continue to follow  If discussed at Long Length of Stay Meetings, dates discussed:    Additional Comments:  Marilu Favre, RN 11/05/2016, 10:01 AM

## 2016-11-05 NOTE — Progress Notes (Addendum)
PROGRESS NOTE                                                                                                                                                                                                             Patient Demographics:    Cassandra Morris, is a 66 y.o. female, DOB - 25-Jul-1951, YKD:[id number]  Admit date - 11/04/2016   Admitting Physician Etta Quill, DO  Outpatient Primary MD for the patient is Cathlean Cower, MD  LOS - 1  Outpatient Specialists: none  Chief Complaint  Patient presents with  . Abdominal Pain  . Nausea  . Emesis  . Diarrhea  . Fever       Brief Narrative   66 year old female with history of diabetes mellitus (on insulin), hypertension (not on any medications), hyperlipidemia and depression presented with one-week history of epigastric and right upper quadrant pain associated with nausea vomiting and fever. Patient found to have acute cholecystitis and admitted to hospitalist service. Surgery consulted.    Subjective:   Still has pain in her right upper quadrant. Denies nausea or vomiting.   Assessment  & Plan :    Principal Problem:   Acute calculous cholecystitis Keep nothing by mouth. IV fluids. Empiric IV Zosyn. In control with when necessary Dilaudid and Motrin. [place] surgery on board.   Active Problems:   Type 2 diabetes mellitus with diabetic neuropathy, with long-term current use of insulin ([place]) Patient on Lantus and lispro at home. I will give her half the dose of Lantus this morning since she is NPO. Continue sliding scale coverage.  Hyperlipidemia Continue statin.   Prolonged QTc ( !!621) relpenish k. Check mg and replenish. Hold zoloft and abilify. Repeat EKG this am shows normal QTC (456). Will monitor on telemetry.  Depression Hold both zoloft and abilify given markedly prolonged Qt interval.    Preoperative clearance and recommendations. Patient  is undergoing intermediate risk surgery (cholecystectomy). She is able to perform 4METs  and her Revised cardiac index  For major cardiac complications perioperatively is 0.4-1.3% ( low risk).  EKG is negative for any ischemia. patient is not on any preop beta blocker and does not need it perioperatively.  She does not any preoperative cardiac evaluation.    Code Status : Full code  Family Communication  : None at bedside  Disposition Plan  home once improved postsurgery  Barriers For Discharge : Active symptoms  Consults  :  [place] [facility]  Procedures  :  CT abdomen  DVT Prophylaxis  :  Lovenox -  Lab Results  Component Value Date   PLT 254 11/05/2016    Antibiotics  :    Anti-infectives    Start     Dose/Rate Route Frequency Ordered Stop   11/05/16 0600  piperacillin-tazobactam (ZOSYN) IVPB 3.375 g     3.375 g 12.5 mL/hr over 240 Minutes Intravenous Every 8 hours 11/04/16 2321     11/04/16 2215  piperacillin-tazobactam (ZOSYN) IVPB 3.375 g     3.375 g 100 mL/hr over 30 Minutes Intravenous  Once 11/04/16 2202 11/04/16 2252        Objective:   Vitals:   11/04/16 2330 11/05/16 0016 11/05/16 0042 11/05/16 0401  BP: (!) 127/51 136/56 (!) 152/50 (!) 142/53  Pulse: 79 83 82 83  Resp:  17 17 18   Temp:   (!) 100.9 F (38.3 C) 99.9 F (37.7 C)  TempSrc:   Oral Oral  SpO2: 96% 96% 96% 97%  Weight:   97 kg (213 lb 13.5 oz)   Height:   5\' 8"  (1.727 m)     Wt Readings from Last 3 Encounters:  11/05/16 97 kg (213 lb 13.5 oz)  09/24/16 97.1 kg (214 lb)  08/15/16 97.4 kg (214 lb 12.8 oz)     Intake/Output Summary (Last 24 hours) at 11/05/16 0937 Last data filed at 11/05/16 0500  Gross per 24 hour  Intake           464.58 ml  Output              400 ml  Net            64.58 ml     Physical Exam  Gen: not in distress HEENT: No icterus, moist mucosa, supple neck Chest: clear b/l, no added sounds CVS: N S1&S2, no murmurs, GI: soft,  ND, BS+, epigastric  and right upper quadrant tenderness Musculoskeletal: warm, no edema     Data Review:    CBC  Recent Labs Lab 11/04/16 1338 11/04/16 1600 11/05/16 0408  WBC  --  16.1* 18.1*  HGB 14.3 12.5 11.4*  HCT 42.0 38.0 35.1*  PLT  --  250 254  MCV  --  85.4 86.0  MCH  --  28.1 27.9  MCHC  --  32.9 32.5  RDW  --  12.7 12.8    Chemistries   Recent Labs Lab 11/04/16 1338 11/04/16 1600 11/05/16 0408  NA 134* 132* 133*  K 3.8 3.6 3.4*  CL 95* 98* 102  CO2  --  22 23  GLUCOSE 330* 350* 239*  BUN 18 17 17   CREATININE 0.90 1.00 0.93  CALCIUM  --  8.1* 7.9*  AST  --  15 19  ALT  --  11* 13*  ALKPHOS  --  76 71  BILITOT  --  0.9 0.9   ------------------------------------------------------------------------------------------------------------------ No results for input(s): CHOL, HDL, LDLCALC, TRIG, CHOLHDL, LDLDIRECT in the last 72 hours.  Lab Results  Component Value Date   HGBA1C 7.6 09/24/2016   ------------------------------------------------------------------------------------------------------------------ No results for input(s): TSH, T4TOTAL, T3FREE, THYROIDAB in the last 72 hours.  Invalid input(s): FREET3 ------------------------------------------------------------------------------------------------------------------ No results for input(s): VITAMINB12, FOLATE, FERRITIN, TIBC, IRON, RETICCTPCT in the last 72 hours.  Coagulation profile  Recent Labs Lab 11/04/16 2343  INR 1.21    No results for  input(s): DDIMER in the last 72 hours.  Cardiac Enzymes No results for input(s): CKMB, TROPONINI, MYOGLOBIN in the last 168 hours.  Invalid input(s): CK ------------------------------------------------------------------------------------------------------------------ No results found for: BNP  Inpatient Medications  Scheduled Meds: . ARIPiprazole  2.5 mg Oral Daily  . insulin aspart  0-15 Units Subcutaneous Q4H  . piperacillin-tazobactam (ZOSYN)  IV  3.375 g  Intravenous Q8H  . sertraline  200 mg Oral Daily   Continuous Infusions: . sodium chloride 125 mL/hr at 11/05/16 0117   PRN Meds:.acetaminophen, calcium carbonate, HYDROmorphone (DILAUDID) injection, ibuprofen, ondansetron (ZOFRAN) IV  Micro Results Recent Results (from the past 240 hour(s))  Surgical pcr screen     Status: None   Collection Time: 11/05/16  2:02 AM  Result Value Ref Range Status   MRSA, PCR NEGATIVE NEGATIVE Final   Staphylococcus aureus NEGATIVE NEGATIVE Final    Comment:        The Xpert SA Assay (FDA approved for NASAL specimens in patients over 80 years of age), is one component of a comprehensive surveillance program.  Test performance has been validated by [facility] for patients greater than or equal to 82 year old. It is not intended to diagnose infection nor to guide or monitor treatment.     Radiology Reports Ct Abdomen Pelvis W Contrast  Result Date: 11/04/2016 CLINICAL DATA:  Vomiting, diarrhea EXAM: CT ABDOMEN AND PELVIS WITH CONTRAST TECHNIQUE: Multidetector CT imaging of the abdomen and pelvis was performed using the standard protocol following bolus administration of intravenous contrast. CONTRAST:  140mL ISOVUE-300 IOPAMIDOL (ISOVUE-300) INJECTION 61% COMPARISON:  None. FINDINGS: Lower chest: Lung bases are clear. Hepatobiliary: Liver is within normal limits. Multiple gallstones with gallbladder wall thickening and surrounding inflammatory changes, worrisome for acute cholecystitis. No intrahepatic or extrahepatic ductal dilatation. Pancreas: Within normal limits. Spleen: Within normal limits. Adrenals/Urinary Tract: Adrenal glands are within normal limits. Kidneys are within normal limits.  No hydronephrosis. Bladder is underdistended but unremarkable. Stomach/Bowel: Stomach is notable for a tiny hiatal hernia. No evidence of bowel obstruction. Normal appendix (series 201/ image 66). Vascular/Lymphatic: No evidence of abdominal aortic aneurysm.  Atherosclerotic calcifications of the abdominal aorta and branch vessels. No suspicious abdominopelvic lymphadenopathy. Reproductive: Uterus is grossly unremarkable. Bilateral ovaries are unremarkable. Other: No abdominopelvic ascites. Musculoskeletal: Visualized osseous structures are within normal limits. IMPRESSION: Cholelithiasis with findings suspicious for acute cholecystitis. These results were called by telephone at the time of interpretation on 11/04/2016 at 9:57 pm to Dr. Virgel Manifold , who verbally acknowledged these results. Electronically Signed   By: Julian Hy M.D.   On: 11/04/2016 21:57   US Abdomen Limited Ruq  Result Date: 11/05/2016 CLINICAL DATA:  Right upper quadrant pain. EXAM: US ABDOMEN LIMITED - RIGHT UPPER QUADRANT COMPARISON:  CT 11/04/2016. FINDINGS: Gallbladder: Multiple gallstones noted measuring up to 2.5 cm. Gallbladder wall thickened at 3.8 mm. Positive Murphy sign. Common bile duct: Diameter: 4.7 mm Liver: Increased echogenicity consistent with fatty infiltration and/or hepatocellular disease. IMPRESSION: 1. Multiple gallstones measuring up to 2.5 cm. Gallbladder wall is thickened at 3.8 mm. Positive Murphy sign. Cholecystitis cannot be excluded. 2. Increased echogenicity liver consistent fatty infiltration and/or pedis 80 disease. Electronically Signed   By: Marcello Moores  Register   On: 11/05/2016 08:46    Time Spent in minutes  35   Louellen Molder M.D on 11/05/2016 at 9:37 AM  Between 7am to 7pm - Pager - [phone]  After 7pm go to [email or web address] - password [email or web address]  [facility] -  Office  [phone]     

## 2016-11-05 NOTE — Anesthesia Preprocedure Evaluation (Signed)
Anesthesia Evaluation  Patient identified by MRN, date of birth, ID band Patient awake    Reviewed: Allergy & Precautions, NPO status , Patient's Chart, lab work & pertinent test results  Airway Mallampati: I       Dental no notable dental hx.    Pulmonary neg pulmonary ROS, former smoker,    Pulmonary exam normal        Cardiovascular hypertension, Normal cardiovascular exam     Neuro/Psych    GI/Hepatic negative GI ROS, Neg liver ROS,   Endo/Other  diabetes, Type 2, Insulin Dependent  Renal/GU negative Renal ROS  negative genitourinary   Musculoskeletal negative musculoskeletal ROS (+)   Abdominal (+) + obese,   Peds  Hematology   Anesthesia Other Findings   Reproductive/Obstetrics                             Anesthesia Physical Anesthesia Plan  ASA: II  Anesthesia Plan: General   Post-op Pain Management:    Induction: Intravenous  Airway Management Planned: Oral ETT  Additional Equipment:   Intra-op Plan:   Post-operative Plan: Extubation in OR  Informed Consent: I have reviewed the patients History and Physical, chart, labs and discussed the procedure including the risks, benefits and alternatives for the proposed anesthesia with the patient or authorized representative who has indicated his/her understanding and acceptance.     Plan Discussed with: CRNA and Surgeon  Anesthesia Plan Comments:         Anesthesia Quick Evaluation

## 2016-11-05 NOTE — Anesthesia Postprocedure Evaluation (Addendum)
Anesthesia Post Note  Patient: Cassandra Morris  Procedure(s) Performed: Procedure(s) (LRB): LAPAROSCOPIC CHOLECYSTECTOMY (N/A)  Patient location during evaluation: PACU Anesthesia Type: General Level of consciousness: awake and sedated Pain management: pain level not controlled Vital Signs Assessment: post-procedure vital signs reviewed and stable Respiratory status: spontaneous breathing Cardiovascular status: stable Postop Assessment: no signs of nausea or vomiting Anesthetic complications: no        Last Vitals:  Vitals:   11/05/16 0401 11/05/16 1735  BP: (!) 142/53 (!) 157/58  Pulse: 83 93  Resp: 18 13  Temp: 37.7 C 36.5 C    Last Pain:  Vitals:   11/05/16 1745  TempSrc:   PainSc: 8    Pain Goal:                 Vlasta Baskin JR,JOHN Tarrance Januszewski

## 2016-11-06 ENCOUNTER — Encounter (HOSPITAL_COMMUNITY): Payer: Self-pay | Admitting: General Surgery

## 2016-11-06 DIAGNOSIS — K8000 Calculus of gallbladder with acute cholecystitis without obstruction: Principal | ICD-10-CM

## 2016-11-06 DIAGNOSIS — F3289 Other specified depressive episodes: Secondary | ICD-10-CM

## 2016-11-06 DIAGNOSIS — Z794 Long term (current) use of insulin: Secondary | ICD-10-CM

## 2016-11-06 DIAGNOSIS — E1140 Type 2 diabetes mellitus with diabetic neuropathy, unspecified: Secondary | ICD-10-CM

## 2016-11-06 DIAGNOSIS — D72829 Elevated white blood cell count, unspecified: Secondary | ICD-10-CM

## 2016-11-06 LAB — GLUCOSE, CAPILLARY
GLUCOSE-CAPILLARY: 197 mg/dL — AB (ref 65–99)
GLUCOSE-CAPILLARY: 249 mg/dL — AB (ref 65–99)
GLUCOSE-CAPILLARY: 282 mg/dL — AB (ref 65–99)
GLUCOSE-CAPILLARY: 296 mg/dL — AB (ref 65–99)
Glucose-Capillary: 177 mg/dL — ABNORMAL HIGH (ref 65–99)
Glucose-Capillary: 169 mg/dL — ABNORMAL HIGH (ref 65–99)

## 2016-11-06 LAB — COMPREHENSIVE METABOLIC PANEL
ALK PHOS: 77 U/L (ref 38–126)
ALT: 36 U/L (ref 14–54)
AST: 40 U/L (ref 15–41)
Albumin: 2.4 g/dL — ABNORMAL LOW (ref 3.5–5.0)
Anion gap: 9 (ref 5–15)
BILIRUBIN TOTAL: 0.5 mg/dL (ref 0.3–1.2)
BUN: 15 mg/dL (ref 6–20)
CALCIUM: 7.7 mg/dL — AB (ref 8.9–10.3)
CO2: 21 mmol/L — ABNORMAL LOW (ref 22–32)
CREATININE: 0.78 mg/dL (ref 0.44–1.00)
Chloride: 105 mmol/L (ref 101–111)
Glucose, Bld: 190 mg/dL — ABNORMAL HIGH (ref 65–99)
Potassium: 3.7 mmol/L (ref 3.5–5.1)
Sodium: 135 mmol/L (ref 135–145)
TOTAL PROTEIN: 5.6 g/dL — AB (ref 6.5–8.1)

## 2016-11-06 LAB — CBC
HEMATOCRIT: 33.2 % — AB (ref 36.0–46.0)
HEMOGLOBIN: 10.2 g/dL — AB (ref 12.0–15.0)
MCH: 26.9 pg (ref 26.0–34.0)
MCHC: 30.7 g/dL (ref 30.0–36.0)
MCV: 87.6 fL (ref 78.0–100.0)
PLATELETS: 247 10*3/uL (ref 150–400)
RBC: 3.79 MIL/uL — AB (ref 3.87–5.11)
RDW: 12.9 % (ref 11.5–15.5)
WBC: 13.1 10*3/uL — ABNORMAL HIGH (ref 4.0–10.5)

## 2016-11-06 MED ORDER — OXYCODONE HCL 5 MG PO TABS
5.0000 mg | ORAL_TABLET | ORAL | Status: DC | PRN
  Administered 2016-11-06 – 2016-11-08 (×8): 5 mg via ORAL
  Filled 2016-11-06 (×8): qty 1

## 2016-11-06 MED ORDER — INSULIN GLARGINE 100 UNIT/ML ~~LOC~~ SOLN
25.0000 [IU] | Freq: Every day | SUBCUTANEOUS | Status: DC
  Administered 2016-11-06 – 2016-11-07 (×2): 25 [IU] via SUBCUTANEOUS
  Filled 2016-11-06 (×3): qty 0.25

## 2016-11-06 NOTE — Progress Notes (Addendum)
Patient ID: Cassandra Morris, female   DOB: Jun 16, 1951, 66 y.o.   MRN: [id number]  PROGRESS NOTE    Cassandra Morris  [id number] DOB: 1951/03/23 DOA: 11/04/2016  PCP: Cathlean Cower, MD   Brief Narrative:  66 year old female with history of insulin dependent diabetes mellitus, hypertension (not on any medications), hyperlipidemia and depression who presented with one-week history of epigastric and right upper quadrant pain associated with nausea, vomiting and fever. Patient found to have acute cholecystitis and underwent cholecystectomy 11/05/2016.   Assessment & Plan:   Principal Problem:   Acute calculous cholecystitis / leukocytosis - Status post cholecystectomy 11/05/2016 - Patient is currently on full liquid diet, advance diet per surgery - Continue Zosyn, per surgery total of 5 days postoperative - Appreciate surgery following - Continue pain management efforts  Active Problems:   Type 2 diabetes mellitus with diabetic neuropathy, with long-term current use of insulin ([place]) - Patient is currently on sliding scale insulin - CBGs in past 24 hours: 296, 249, 177 - We will resume patient's Lantus at bedtime, she takes 50 units at bedtime but will start at 25 units until she is eating full meals   Prolonged QTc  - Holding zoloft and abilify - Repeat EKG showed normal QTC (456)    Depression - Holding both zoloft and abilify given recent prolonged Qt interval.      Hypokalemia - Due to acute GI issues, cholecystitis - Supplemented and WNL this am   DVT prophylaxis: SCD's bilaterally  Code Status: full code  Family Communication: no family at the bedside this am Disposition Plan: home once cleared by surgery    Consultants:   Surgery   Procedures:   Cholecystectomy 11/05/2016   Antimicrobials:   Zosyn    Subjective: Pain is 8/10 this am, in mid abdomen.  Objective: Vitals:   11/05/16 2151 11/06/16 0228 11/06/16 0607 11/06/16 1001  BP: (!) 125/50 (!) 137/51  (!) 141/53 138/62  Pulse: 75 71 71 75  Resp: 18 17 17    Temp: 98.5 F (36.9 C) 98.1 F (36.7 C) 98.6 F (37 C) 99.1 F (37.3 C)  TempSrc: Oral Oral Oral Oral  SpO2: 97% 96% 98%   Weight:      Height:        Intake/Output Summary (Last 24 hours) at 11/06/16 1008 Last data filed at 11/06/16 4818  Gross per 24 hour  Intake             2340 ml  Output             1105 ml  Net             1235 ml   Filed Weights   11/05/16 0042  Weight: 97 kg (213 lb 13.5 oz)    Examination:  General exam: Appears calm and comfortable  Respiratory system: Clear to auscultation. Respiratory effort normal. Cardiovascular system: S1 & S2 heard, Rate controlled  Gastrointestinal system: Abdomen is tender diffusely, (+) BS Central nervous system: Alert and oriented. No focal neurological deficits. Extremities: Symmetric 5 x 5 power. Skin: No rashes, lesions or ulcers Psychiatry: Judgement and insight appear normal. Mood & affect appropriate.   Data Reviewed: I have personally reviewed following labs and imaging studies  CBC:  Recent Labs Lab 11/04/16 1338 11/04/16 1600 11/05/16 0408 11/06/16 0748  WBC  --  16.1* 18.1* 13.1*  HGB 14.3 12.5 11.4* 10.2*  HCT 42.0 38.0 35.1* 33.2*  MCV  --  85.4 86.0 87.6  PLT  --  250 254 384   Basic Metabolic Panel:  Recent Labs Lab 11/04/16 1338 11/04/16 1600 11/05/16 0408 11/05/16 1000 11/06/16 0748  NA 134* 132* 133*  --  135  K 3.8 3.6 3.4*  --  3.7  CL 95* 98* 102  --  105  CO2  --  22 23  --  21*  GLUCOSE 330* 350* 239*  --  190*  BUN 18 17 17   --  15  CREATININE 0.90 1.00 0.93  --  0.78  CALCIUM  --  8.1* 7.9*  --  7.7*  MG  --   --   --  1.7  --    GFR: Estimated Creatinine Clearance: 85.3 mL/min (by C-G formula based on SCr of 0.78 mg/dL). Liver Function Tests:  Recent Labs Lab 11/04/16 1600 11/05/16 0408 11/06/16 0748  AST 15 19 40  ALT 11* 13* 36  ALKPHOS 76 71 77  BILITOT 0.9 0.9 0.5  PROT 6.9 6.3* 5.6*  ALBUMIN  3.0* 2.8* 2.4*    Recent Labs Lab 11/04/16 1600  LIPASE <10*   No results for input(s): AMMONIA in the last 168 hours. Coagulation Profile:  Recent Labs Lab 11/04/16 2343  INR 1.21   Cardiac Enzymes: No results for input(s): CKTOTAL, CKMB, CKMBINDEX, TROPONINI in the last 168 hours. BNP (last 3 results) No results for input(s): PROBNP in the last 8760 hours. HbA1C: No results for input(s): HGBA1C in the last 72 hours. CBG:  Recent Labs Lab 11/05/16 1731 11/05/16 2000 11/06/16 0006 11/06/16 0411 11/06/16 0737  GLUCAP 240* 237* 296* 249* 177*   Lipid Profile: No results for input(s): CHOL, HDL, LDLCALC, TRIG, CHOLHDL, LDLDIRECT in the last 72 hours. Thyroid Function Tests: No results for input(s): TSH, T4TOTAL, FREET4, T3FREE, THYROIDAB in the last 72 hours. Anemia Panel: No results for input(s): VITAMINB12, FOLATE, FERRITIN, TIBC, IRON, RETICCTPCT in the last 72 hours. Urine analysis:    Component Value Date/Time   COLORURINE AMBER (A) 11/04/2016 2041   APPEARANCEUR HAZY (A) 11/04/2016 2041   LABSPEC 1.033 (H) 11/04/2016 2041   PHURINE 5.0 11/04/2016 2041   GLUCOSEU >=500 (A) 11/04/2016 2041   GLUCOSEU NEGATIVE 09/02/2015 1652   HGBUR NEGATIVE 11/04/2016 2041   HGBUR negative 11/29/2008 0843   BILIRUBINUR NEGATIVE 11/04/2016 2041   BILIRUBINUR n 04/20/2014 1705   KETONESUR NEGATIVE 11/04/2016 2041   PROTEINUR 100 (A) 11/04/2016 2041   UROBILINOGEN 0.2 09/02/2015 1652   NITRITE NEGATIVE 11/04/2016 2041   LEUKOCYTESUR NEGATIVE 11/04/2016 2041   Sepsis Labs: @LABRCNTIP (procalcitonin:4,lacticidven:4)    Surgical pcr screen     Status: None   Collection Time: 11/05/16  2:02 AM  Result Value Ref Range Status   MRSA, PCR NEGATIVE NEGATIVE Final   Staphylococcus aureus NEGATIVE NEGATIVE Final      Radiology Studies: Ct Abdomen Pelvis W Contrast Result Date: 11/04/2016 Cholelithiasis with findings suspicious for acute cholecystitis.   US Abdomen  Limited Ruq Result Date: 11/05/2016 1. Multiple gallstones measuring up to 2.5 cm. Gallbladder wall is thickened at 3.8 mm. Positive Murphy sign. Cholecystitis cannot be excluded. 2. Increased echogenicity liver consistent fatty infiltration and/or pedis 80 disease.    Scheduled Meds: . insulin aspart  0-15 Units Subcutaneous Q4H  . insulin glargine  25 Units Subcutaneous Once  . piperacillin-tazobactam (ZOSYN)  IV  3.375 g Intravenous Q8H   Continuous Infusions: . sodium chloride 50 mL/hr at 11/06/16 0945     LOS: 2 days    Time spent: 25 minutes  Greater than  50% of the time spent on counseling and coordinating the care.   Leisa Lenz, MD [facility] Pager [phone]  If 7PM-7AM, please contact night-coverage [email or web address] Password [email or web address] 11/06/2016, 10:08 AM

## 2016-11-06 NOTE — Progress Notes (Signed)
Pharmacy Antibiotic Note  Cassandra Morris is a 66 y.o. female admitted on 11/04/2016 with intra-abd infection - acute cholecystitis, now s/p cholecystectomy.  Pharmacy has been consulted for Zosyn dosing.  Patient's renal function is stable  Plan: Zosyn 3.375gm IV q8h -  doses over 4 hours Pharmacy will sign off and follow peripherally.  Thank you for the consult! Consider adding stop date to abx order.    Height: 5\' 8"  (172.7 cm) Weight: 213 lb 13.5 oz (97 kg) IBW/kg (Calculated) : 63.9  Temp (24hrs), Avg:98.1 F (36.7 C), Min:97 F (36.1 C), Max:98.6 F (37 C)   Recent Labs Lab 11/04/16 1338 11/04/16 1600 11/05/16 0408 11/06/16 0748  WBC  --  16.1* 18.1* 13.1*  CREATININE 0.90 1.00 0.93  --     Estimated Creatinine Clearance: 73.4 mL/min (by C-G formula based on SCr of 0.93 mg/dL).    Allergies  Allergen Reactions  . Trulicity [Dulaglutide] Nausea And Vomiting  . Metformin And Related Diarrhea    3/11 zosyn >>    Devun Anna D. Mina Marble, PharmD, BCPS Pager:  [phone] 11/06/2016, 9:25 AM

## 2016-11-06 NOTE — Progress Notes (Signed)
[facility] Progress Note  1 Day Post-Op  Subjective:  Patient is s/p cholecystectomy. She reports she is doing well, but states that she is in a lot of pain. She rates the pain around 5/10 at rest but increased to 8/10 when she coughs, breathes or moves. She reports that her current pain management does help to reduce the pain somewhat. She denies any bowel movement or flatus at this time. She is tolerating her clear liquid diet well and is urinating regularly. Patient has a drain in place, that is intact and draining clear sanguinous liquids.  Patient states that she has had limited ambulation to the bathroom but states she will try to move more today.   Per nursing: Requested to have an oral narcotic added to orders for pain control. Currently receiving Dilaudid IV, Motrin and Tylenol as needed.  Objective: Vital signs in last 24 hours: Temp:  [97 F (36.1 C)-98.6 F (37 C)] 98.6 F (37 C) (03/13 0607) Pulse Rate:  [71-93] 71 (03/13 0607) Resp:  [13-18] 17 (03/13 0607) BP: (125-157)/(50-58) 141/53 (03/13 0607) SpO2:  [95 %-99 %] 98 % (03/13 0607) Last BM Date: 11/05/16  Intake/Output from previous day: 03/12 0701 - 03/13 0700 In: 2340 [P.O.:780; I.V.:1510; IV Piggyback:50] Out: 1105 [Urine:850; Drains:205; Blood:50] Intake/Output this shift: No intake/output data recorded.  PE: Gen:  Alert, NAD, pleasant Card:  RRR, no murmurs gallops or rubs auscultated. Pulm:  CTA, no W/R/R Abd: Soft, and tender to palpation, normoactive bowel sounds, incisions are healing well, drain with minimal serosanguinous drainage. Ext:  No erythema, edema, or tenderness. Pulses 2+ and regular bilaterally  Lab Results:   Recent Labs  11/04/16 1600 11/05/16 0408  WBC 16.1* 18.1*  HGB 12.5 11.4*  HCT 38.0 35.1*  PLT 250 254   BMET  Recent Labs  11/04/16 1600 11/05/16 0408  NA 132* 133*  K 3.6 3.4*  CL 98* 102  CO2 22 23  GLUCOSE 350* 239*  BUN 17 17  CREATININE 1.00  0.93  CALCIUM 8.1* 7.9*   PT/INR  Recent Labs  11/04/16 2343  LABPROT 15.4*  INR 1.21   CMP     Component Value Date/Time   NA 133 (L) 11/05/2016 0408   K 3.4 (L) 11/05/2016 0408   CL 102 11/05/2016 0408   CO2 23 11/05/2016 0408   GLUCOSE 239 (H) 11/05/2016 0408   BUN 17 11/05/2016 0408   CREATININE 0.93 11/05/2016 0408   CALCIUM 7.9 (L) 11/05/2016 0408   PROT 6.3 (L) 11/05/2016 0408   ALBUMIN 2.8 (L) 11/05/2016 0408   AST 19 11/05/2016 0408   ALT 13 (L) 11/05/2016 0408   ALKPHOS 71 11/05/2016 0408   BILITOT 0.9 11/05/2016 0408   GFRNONAA >60 11/05/2016 0408   GFRAA >60 11/05/2016 0408   Lipase     Component Value Date/Time   LIPASE <10 (L) 11/04/2016 1600       Studies/Results: Ct Abdomen Pelvis W Contrast  Result Date: 11/04/2016 CLINICAL DATA:  Vomiting, diarrhea EXAM: CT ABDOMEN AND PELVIS WITH CONTRAST TECHNIQUE: Multidetector CT imaging of the abdomen and pelvis was performed using the standard protocol following bolus administration of intravenous contrast. CONTRAST:  161mL ISOVUE-300 IOPAMIDOL (ISOVUE-300) INJECTION 61% COMPARISON:  None. FINDINGS: Lower chest: Lung bases are clear. Hepatobiliary: Liver is within normal limits. Multiple gallstones with gallbladder wall thickening and surrounding inflammatory changes, worrisome for acute cholecystitis. No intrahepatic or extrahepatic ductal dilatation. Pancreas: Within normal limits. Spleen: Within normal limits. Adrenals/Urinary Tract: Adrenal  glands are within normal limits. Kidneys are within normal limits.  No hydronephrosis. Bladder is underdistended but unremarkable. Stomach/Bowel: Stomach is notable for a tiny hiatal hernia. No evidence of bowel obstruction. Normal appendix (series 201/ image 66). Vascular/Lymphatic: No evidence of abdominal aortic aneurysm. Atherosclerotic calcifications of the abdominal aorta and branch vessels. No suspicious abdominopelvic lymphadenopathy. Reproductive: Uterus is grossly  unremarkable. Bilateral ovaries are unremarkable. Other: No abdominopelvic ascites. Musculoskeletal: Visualized osseous structures are within normal limits. IMPRESSION: Cholelithiasis with findings suspicious for acute cholecystitis. These results were called by telephone at the time of interpretation on 11/04/2016 at 9:57 pm to Dr. Virgel Manifold , who verbally acknowledged these results. Electronically Signed   By: Julian Hy M.D.   On: 11/04/2016 21:57   US Abdomen Limited Ruq  Result Date: 11/05/2016 CLINICAL DATA:  Right upper quadrant pain. EXAM: US ABDOMEN LIMITED - RIGHT UPPER QUADRANT COMPARISON:  CT 11/04/2016. FINDINGS: Gallbladder: Multiple gallstones noted measuring up to 2.5 cm. Gallbladder wall thickened at 3.8 mm. Positive Murphy sign. Common bile duct: Diameter: 4.7 mm Liver: Increased echogenicity consistent with fatty infiltration and/or hepatocellular disease. IMPRESSION: 1. Multiple gallstones measuring up to 2.5 cm. Gallbladder wall is thickened at 3.8 mm. Positive Murphy sign. Cholecystitis cannot be excluded. 2. Increased echogenicity liver consistent fatty infiltration and/or pedis 80 disease. Electronically Signed   By: Marcello Moores  Register   On: 11/05/2016 08:46    Anti-infectives: Anti-infectives    Start     Dose/Rate Route Frequency Ordered Stop   11/05/16 1530  piperacillin-tazobactam (ZOSYN) IVPB 3.375 g  Status:  Discontinued     3.375 g 100 mL/hr over 30 Minutes Intravenous To Surgery 11/05/16 1524 11/05/16 1839   11/05/16 0600  piperacillin-tazobactam (ZOSYN) IVPB 3.375 g     3.375 g 12.5 mL/hr over 240 Minutes Intravenous Every 8 hours 11/04/16 2321     11/04/16 2215  piperacillin-tazobactam (ZOSYN) IVPB 3.375 g     3.375 g 100 mL/hr over 30 Minutes Intravenous  Once 11/04/16 2202 11/04/16 2252       Assessment/Plan  1. Acute cholecystitis with calculous: -  Patient POD#1 s/p cholecystectomy. Appears well. Tolerated procedure. She continues to have some  pain and will add an oral option for pain control. - Encouraged to ambulate as tolerated - Tolerating diet well; will advance to full liquids - Will continue zosyn regimen as directed for five days total postop of abx    LOS: 2 days    Roselle Locus , PA-S  Agree with above, have seen and evaluated patient

## 2016-11-07 DIAGNOSIS — E876 Hypokalemia: Secondary | ICD-10-CM | POA: Diagnosis present

## 2016-11-07 LAB — BASIC METABOLIC PANEL
Anion gap: 8 (ref 5–15)
BUN: 8 mg/dL (ref 6–20)
CALCIUM: 7.6 mg/dL — AB (ref 8.9–10.3)
CHLORIDE: 101 mmol/L (ref 101–111)
CO2: 26 mmol/L (ref 22–32)
CREATININE: 0.83 mg/dL (ref 0.44–1.00)
GFR calc Af Amer: >60 mL/min (ref >60)
GFR calc non Af Amer: >60 mL/min (ref >60)
GLUCOSE: 136 mg/dL — AB (ref 65–99)
Potassium: 3.1 mmol/L — ABNORMAL LOW (ref 3.5–5.1)
Sodium: 135 mmol/L (ref 135–145)

## 2016-11-07 LAB — CBC
HEMATOCRIT: 29.8 % — AB (ref 36.0–46.0)
Hemoglobin: 9.4 g/dL — ABNORMAL LOW (ref 12.0–15.0)
MCH: 27.5 pg (ref 26.0–34.0)
MCHC: 31.5 g/dL (ref 30.0–36.0)
MCV: 87.1 fL (ref 78.0–100.0)
Platelets: 250 10*3/uL (ref 150–400)
RBC: 3.42 MIL/uL — ABNORMAL LOW (ref 3.87–5.11)
RDW: 13 % (ref 11.5–15.5)
WBC: 11.1 10*3/uL — ABNORMAL HIGH (ref 4.0–10.5)

## 2016-11-07 LAB — GLUCOSE, CAPILLARY
GLUCOSE-CAPILLARY: 199 mg/dL — AB (ref 65–99)
GLUCOSE-CAPILLARY: 195 mg/dL — AB (ref 65–99)
Glucose-Capillary: 130 mg/dL — ABNORMAL HIGH (ref 65–99)
Glucose-Capillary: 135 mg/dL — ABNORMAL HIGH (ref 65–99)
Glucose-Capillary: 170 mg/dL — ABNORMAL HIGH (ref 65–99)
Glucose-Capillary: 201 mg/dL — ABNORMAL HIGH (ref 65–99)

## 2016-11-07 MED ORDER — POTASSIUM CHLORIDE CRYS ER 20 MEQ PO TBCR
40.0000 meq | EXTENDED_RELEASE_TABLET | Freq: Once | ORAL | Status: AC
  Administered 2016-11-07: 40 meq via ORAL
  Filled 2016-11-07: qty 2

## 2016-11-07 MED ORDER — INSULIN ASPART 100 UNIT/ML ~~LOC~~ SOLN
0.0000 [IU] | Freq: Three times a day (TID) | SUBCUTANEOUS | Status: DC
  Administered 2016-11-08: 3 [IU] via SUBCUTANEOUS
  Administered 2016-11-08: 5 [IU] via SUBCUTANEOUS

## 2016-11-07 MED ORDER — INSULIN ASPART 100 UNIT/ML ~~LOC~~ SOLN: 0.0000 [IU] | Freq: Every day | SUBCUTANEOUS | Status: DC

## 2016-11-07 MED ORDER — AMOXICILLIN-POT CLAVULANATE 875-125 MG PO TABS
1.0000 | ORAL_TABLET | Freq: Two times a day (BID) | ORAL | Status: DC
  Administered 2016-11-07 – 2016-11-08 (×3): 1 via ORAL
  Filled 2016-11-07 (×3): qty 1

## 2016-11-07 NOTE — Consult Note (Addendum)
   [facility] CM Inpatient Consult   11/07/2016  Cassandra Morris 11-27-1950 [id number]    Cassandra Morris is active with [facility] Care Management/Link to [facility] program for DM management for [facility] employees/dependents. Spoke with Cassandra Morris at bedside to make aware that her Link to Wellness RN Coordinator will follow up with her post discharge. Appreciative of visit and was very complimentary of Jonathon Jordan to [employer]. Provided contact information. Will make inpatient RNCM aware.   Marthenia Rolling, MSN-Ed, RN,BSN [facility] Liaison [phone]

## 2016-11-07 NOTE — Progress Notes (Signed)
[facility] Progress Note  2 Days Post-Op  Subjective:  Cassandra Morris is a 66 year old female that is POD#2 s/p cholecystectomy with a drain placed.  She reports that she is feeling much improved even since yesterday. She states that she only has mild abdominal pain during palpation. She states that she had some gas pain as well, but notes that a Gas-X helped to resolve that pain last night. She was able to ambulate to the chair yesterday and sat in it for a few hours without much discomfort. She was encouraged to increase her ambulation effort. She has not had a bowel movement yet, but is passing flatus regularly. She has had decent fluid intake, but denies having any solid foods yet.   Overall, the patient is doing well. She does have a slight temperature. However, she denies any fever, chills, nausea, vomiting, or diarrhea.   Objective: Vital signs in last 24 hours: Temp:  [98.5 F (36.9 C)-100.7 F (38.2 C)] 100.1 F (37.8 C) (03/14 0455) Pulse Rate:  [75-80] 78 (03/14 0455) Resp:  [17-18] 17 (03/14 0455) BP: (117-143)/(43-66) 117/43 (03/14 0455) SpO2:  [94 %-97 %] 94 % (03/14 0455) Last BM Date: 11/05/16  Intake/Output from previous day: 03/13 0701 - 03/14 0700 In: 2150 [P.O.:800; I.V.:1300; IV Piggyback:50] Out: 1380 [Urine:1250; Drains:130] Intake/Output this shift: No intake/output data recorded.  PE: Gen:  Alert, NAD, pleasant Card:  RRR, no murmurs, gallops, or rubs Pulm:  CTA bilaterally Abd: Soft, nondistended with mild tenderness to palpation, no HSM, incisions are healing well with no discharge, drain with minimal serosanguinous drainage Ext:  No erythema, edema, or tenderness  Lab Results:   Recent Labs  11/06/16 0748 11/07/16 0759  WBC 13.1* 11.1*  HGB 10.2* 9.4*  HCT 33.2* 29.8*  PLT 247 250   BMET  Recent Labs  11/06/16 0748 11/07/16 0759  NA 135 135  K 3.7 3.1*  CL 105 101  CO2 21* 26  GLUCOSE 190* 136*  BUN 15 8   CREATININE 0.78 0.83  CALCIUM 7.7* 7.6*   PT/INR  Recent Labs  11/04/16 2343  LABPROT 15.4*  INR 1.21   CMP     Component Value Date/Time   NA 135 11/07/2016 0759   K 3.1 (L) 11/07/2016 0759   CL 101 11/07/2016 0759   CO2 26 11/07/2016 0759   GLUCOSE 136 (H) 11/07/2016 0759   BUN 8 11/07/2016 0759   CREATININE 0.83 11/07/2016 0759   CALCIUM 7.6 (L) 11/07/2016 0759   PROT 5.6 (L) 11/06/2016 0748   ALBUMIN 2.4 (L) 11/06/2016 0748   AST 40 11/06/2016 0748   ALT 36 11/06/2016 0748   ALKPHOS 77 11/06/2016 0748   BILITOT 0.5 11/06/2016 0748   GFRNONAA >60 11/07/2016 0759   GFRAA >60 11/07/2016 0759   Lipase     Component Value Date/Time   LIPASE <10 (L) 11/04/2016 1600       Studies/Results: No results found.  Anti-infectives: Anti-infectives    Start     Dose/Rate Route Frequency Ordered Stop   11/05/16 1530  piperacillin-tazobactam (ZOSYN) IVPB 3.375 g  Status:  Discontinued     3.375 g 100 mL/hr over 30 Minutes Intravenous To Surgery 11/05/16 1524 11/05/16 1839   11/05/16 0600  piperacillin-tazobactam (ZOSYN) IVPB 3.375 g     3.375 g 12.5 mL/hr over 240 Minutes Intravenous Every 8 hours 11/04/16 2321     11/04/16 2215  piperacillin-tazobactam (ZOSYN) IVPB 3.375 g     3.375  g 100 mL/hr over 30 Minutes Intravenous  Once 11/04/16 2202 11/04/16 2252       Assessment/Plan 1. Acute calculous cholecystitis: - POD#2 s/p cholecystectomy; will continue pain management as appears well controlled - Continue ABX therapy for 5 days total postop; patient with low grade fever of 100.1 this morning; monitor and administer tylenol if persists - Encouraged to ambulate and encouraged incentive spirometry. - May advance to soft diet       LOS: 3 days    Roselle Locus , PA-S   Doing well, expected jp output, will remove this next week in office, regular diet, needs total 5 days abx, I put on augmentin today and can go home on that as well.  Ok for [employer] from my  standpoint once tolerates regular diet.

## 2016-11-07 NOTE — Progress Notes (Signed)
Patient ID: Cassandra Morris, female   DOB: 09-Dec-1950, 66 y.o.   MRN: [id number]  PROGRESS NOTE    Cassandra Morris  YQM:[id number] DOB: [birth year] DOA: 11/04/2016  PCP: Cathlean Cower, MD   Brief Narrative:  66 year old female with history of insulin dependent diabetes mellitus, hypertension (not on any medications), hyperlipidemia and depression who presented with one-week history of epigastric and right upper quadrant pain associated with nausea, vomiting and fever. Patient found to have acute cholecystitis and underwent cholecystectomy 11/05/2016.  Assessment & Plan:   Principal Problem:   Acute calculous cholecystitis / leukocytosis - Status post cholecystectomy 11/05/2016 - Tolerates regular diet  - Continue Zosyn, per surgery will need total of 5 days from the surgery  - Spiked fever overnight   Active Problems:   Type 2 diabetes mellitus with diabetic neuropathy, with long-term current use of insulin (HCC) - Continue sliding scale insulin - Added yesterday Lantus 25 units at bedtime (this is half of pt home Lantus dose) - CBG's in past 24 hours: 135, 201, 130   Prolonged QTc  - Holding zoloft and abilify - Repeat EKG showed normal QTC (456)    Depression - Holding zoloft and abilify given recent prolonged Qt interval.      Hypokalemia - Due to acute GI issues, cholecystitis - Continue to supplement - Check BMP in am    DVT prophylaxis: SCD's bilaterally  Code Status: full code  Family Communication: no family at the bedside this am Disposition Plan: home once cleared by surgery    Consultants:   Surgery   Procedures:   Cholecystectomy 11/05/2016   Antimicrobials:   Zosyn    Subjective: Pain better this am.  Objective: Vitals:   11/06/16 1001 11/06/16 1320 11/06/16 2055 11/07/16 0455  BP: 138/62 (!) 138/47 (!) 143/66 (!) 117/43  Pulse: 75 77 80 78  Resp:   18 17  Temp: 99.1 F (37.3 C) 98.5 F (36.9 C) (!) 100.7 F (38.2 C) 100.1 F (37.8 C)    TempSrc: Oral Oral Oral Oral  SpO2:  97% 94% 94%  Weight:      Height:        Intake/Output Summary (Last 24 hours) at 11/07/16 1011 Last data filed at 11/07/16 0511  Gross per 24 hour  Intake             2150 ml  Output             1380 ml  Net              770 ml   Filed Weights   11/05/16 0042  Weight: 97 kg (213 lb 13.5 oz)    Examination:  General exam: No acute distress  Respiratory system: No wheezing, no rhonchi  Cardiovascular system: S1 & S2 heard, RRR Gastrointestinal system: (+) BS, non tender abdomen, non distended  Central nervous system: No focal neurological deficits. Extremities: No erythema, palpable pulses  Skin: No rashes, lesions or ulcers, skin is warm and dry  Psychiatry: Normal mood and behavior   Data Reviewed: I have personally reviewed following labs and imaging studies  CBC:  Recent Labs Lab 11/04/16 1338 11/04/16 1600 11/05/16 0408 11/06/16 0748 11/07/16 0759  WBC  --  16.1* 18.1* 13.1* 11.1*  HGB 14.3 12.5 11.4* 10.2* 9.4*  HCT 42.0 38.0 35.1* 33.2* 29.8*  MCV  --  85.4 86.0 87.6 87.1  PLT  --  250 254 247 528   Basic Metabolic Panel:  Recent Labs Lab  11/04/16 1338 11/04/16 1600 11/05/16 0408 11/05/16 1000 11/06/16 0748 11/07/16 0759  NA 134* 132* 133*  --  135 135  K 3.8 3.6 3.4*  --  3.7 3.1*  CL 95* 98* 102  --  105 101  CO2  --  22 23  --  21* 26  GLUCOSE 330* 350* 239*  --  190* 136*  BUN 18 17 17   --  15 8  CREATININE 0.90 1.00 0.93  --  0.78 0.83  CALCIUM  --  8.1* 7.9*  --  7.7* 7.6*  MG  --   --   --  1.7  --   --    GFR: Estimated Creatinine Clearance: 82.2 mL/min (by C-G formula based on SCr of 0.83 mg/dL). Liver Function Tests:  Recent Labs Lab 11/04/16 1600 11/05/16 0408 11/06/16 0748  AST 15 19 40  ALT 11* 13* 36  ALKPHOS 76 71 77  BILITOT 0.9 0.9 0.5  PROT 6.9 6.3* 5.6*  ALBUMIN 3.0* 2.8* 2.4*    Recent Labs Lab 11/04/16 1600  LIPASE <10*   No results for input(s): AMMONIA in the  last 168 hours. Coagulation Profile:  Recent Labs Lab 11/04/16 2343  INR 1.21   Cardiac Enzymes: No results for input(s): CKTOTAL, CKMB, CKMBINDEX, TROPONINI in the last 168 hours. BNP (last 3 results) No results for input(s): PROBNP in the last 8760 hours. HbA1C: No results for input(s): HGBA1C in the last 72 hours. CBG:  Recent Labs Lab 11/06/16 1649 11/06/16 2038 11/07/16 0003 11/07/16 0454 11/07/16 0827  GLUCAP 197* 169* 135* 201* 130*   Lipid Profile: No results for input(s): CHOL, HDL, LDLCALC, TRIG, CHOLHDL, LDLDIRECT in the last 72 hours. Thyroid Function Tests: No results for input(s): TSH, T4TOTAL, FREET4, T3FREE, THYROIDAB in the last 72 hours. Anemia Panel: No results for input(s): VITAMINB12, FOLATE, FERRITIN, TIBC, IRON, RETICCTPCT in the last 72 hours. Urine analysis:    Component Value Date/Time   COLORURINE AMBER (A) 11/04/2016 2041   APPEARANCEUR HAZY (A) 11/04/2016 2041   LABSPEC 1.033 (H) 11/04/2016 2041   PHURINE 5.0 11/04/2016 2041   GLUCOSEU >=500 (A) 11/04/2016 2041   GLUCOSEU NEGATIVE 09/02/2015 1652   HGBUR NEGATIVE 11/04/2016 2041   HGBUR negative 11/29/2008 0843   BILIRUBINUR NEGATIVE 11/04/2016 2041   BILIRUBINUR n 04/20/2014 1705   KETONESUR NEGATIVE 11/04/2016 2041   PROTEINUR 100 (A) 11/04/2016 2041   UROBILINOGEN 0.2 09/02/2015 1652   NITRITE NEGATIVE 11/04/2016 2041   LEUKOCYTESUR NEGATIVE 11/04/2016 2041   Sepsis Labs: @LABRCNTIP (procalcitonin:4,lacticidven:4)    Surgical pcr screen     Status: None   Collection Time: 11/05/16  2:02 AM  Result Value Ref Range Status   MRSA, PCR NEGATIVE NEGATIVE Final   Staphylococcus aureus NEGATIVE NEGATIVE Final      Radiology Studies: Ct Abdomen Pelvis W Contrast Result Date: 11/04/2016 Cholelithiasis with findings suspicious for acute cholecystitis.   US Abdomen Limited Ruq Result Date: 11/05/2016 1. Multiple gallstones measuring up to 2.5 cm. Gallbladder wall is thickened at  3.8 mm. Positive Murphy sign. Cholecystitis cannot be excluded. 2. Increased echogenicity liver consistent fatty infiltration and/or pedis 80 disease.    Scheduled Meds: . insulin aspart  0-15 Units Subcutaneous Q4H  . insulin glargine  25 Units Subcutaneous Once  . insulin glargine  25 Units Subcutaneous QHS  . piperacillin-tazobactam (ZOSYN)  IV  3.375 g Intravenous Q8H   Continuous Infusions: . sodium chloride 50 mL/hr at 11/07/16 0511     LOS: 3 days  Time spent: 15 minutes  Greater than 50% of the time spent on counseling and coordinating the care.   Leisa Lenz, MD [facility] Pager [phone]  If 7PM-7AM, please contact night-coverage [email or web address] Password [facility] 11/07/2016, 10:11 AM

## 2016-11-08 LAB — CBC
HEMATOCRIT: 28.6 % — AB (ref 36.0–46.0)
HEMOGLOBIN: 9.2 g/dL — AB (ref 12.0–15.0)
MCH: 28.3 pg (ref 26.0–34.0)
MCHC: 32.2 g/dL (ref 30.0–36.0)
MCV: 88 fL (ref 78.0–100.0)
Platelets: 232 10*3/uL (ref 150–400)
RBC: 3.25 MIL/uL — AB (ref 3.87–5.11)
RDW: 13.1 % (ref 11.5–15.5)
WBC: 9.6 10*3/uL (ref 4.0–10.5)

## 2016-11-08 LAB — GLUCOSE, CAPILLARY
GLUCOSE-CAPILLARY: 208 mg/dL — AB (ref 65–99)
Glucose-Capillary: 171 mg/dL — ABNORMAL HIGH (ref 65–99)

## 2016-11-08 LAB — BASIC METABOLIC PANEL
ANION GAP: 4 — AB (ref 5–15)
BUN: 5 mg/dL — ABNORMAL LOW (ref 6–20)
CALCIUM: 7.9 mg/dL — AB (ref 8.9–10.3)
CO2: 28 mmol/L (ref 22–32)
Chloride: 104 mmol/L (ref 101–111)
Creatinine, Ser: 0.77 mg/dL (ref 0.44–1.00)
GFR calc Af Amer: >60 mL/min (ref >60)
Glucose, Bld: 194 mg/dL — ABNORMAL HIGH (ref 65–99)
POTASSIUM: 4.2 mmol/L (ref 3.5–5.1)
SODIUM: 136 mmol/L (ref 135–145)

## 2016-11-08 MED ORDER — ACETAMINOPHEN 325 MG PO TABS: 650.0000 mg | ORAL_TABLET | Freq: Four times a day (QID) | ORAL | 0 refills | Status: AC | PRN

## 2016-11-08 MED ORDER — AMOXICILLIN-POT CLAVULANATE 875-125 MG PO TABS: 1.0000 | ORAL_TABLET | Freq: Two times a day (BID) | ORAL | 0 refills | Status: DC

## 2016-11-08 MED FILL — AMOX-CLAV 875-125 MG TABLET: 875-125 | 5 days supply | Qty: 10 | Fill #0

## 2016-11-08 NOTE — Discharge Instructions (Signed)
Please arrive at least 30 min before your appointment to complete your check in paperwork.  If you are unable to arrive 30 min prior to your appointment time we may have to cancel or reschedule you. ° °LAPAROSCOPIC SURGERY: POST OP INSTRUCTIONS  °1. DIET: Follow a light bland diet the first 24 hours after arrival home, such as soup, liquids, crackers, etc. Be sure to include lots of fluids daily. Avoid fast food or heavy meals as your are more likely to get nauseated. Eat a low fat the next few days after surgery.  °2. Take your usually prescribed home medications unless otherwise directed. °3. PAIN CONTROL:  °1. Pain is best controlled by a usual combination of three different methods TOGETHER:  °1. Ice/Heat °2. Over the counter pain medication °3. Prescription pain medication °2. Most patients will experience some swelling and bruising around the incisions. Ice packs or heating pads (30-60 minutes up to 6 times a day) will help. Use ice for the first few days to help decrease swelling and bruising, then switch to heat to help relax tight/sore spots and speed recovery. Some people prefer to use ice alone, heat alone, alternating between ice & heat. Experiment to what works for you. Swelling and bruising can take several weeks to resolve.  °3. It is helpful to take an over-the-counter pain medication regularly for the first few weeks. Choose one of the following that works best for you:  °1. Naproxen (Aleve, etc) Two 220mg tabs twice a day °2. Ibuprofen (Advil, etc) Three 200mg tabs four times a day (every meal & bedtime) °3. Acetaminophen (Tylenol, etc) 500-650mg four times a day (every meal & bedtime) °4. A prescription for pain medication (such as oxycodone, hydrocodone, etc) should be given to you upon discharge. Take your pain medication as prescribed.  °1. If you are having problems/concerns with the prescription medicine (does not control pain, nausea, vomiting, rash, itching, etc), please call us [phone]  [phone] to see if we need to switch you to a different pain medicine that will work better for you and/or control your side effect better. °2. If you need a refill on your pain medication, please contact your pharmacy. They will contact our office to request authorization. Prescriptions will not be filled after 5 pm or on week-ends. °4. Avoid getting constipated. Between the surgery and the pain medications, it is common to experience some constipation. Increasing fluid intake and taking a fiber supplement (such as Metamucil, Citrucel, FiberCon, MiraLax, etc) 1-2 times a day regularly will usually help prevent this problem from occurring. A mild laxative (prune juice, Milk of Magnesia, MiraLax, etc) should be taken according to package directions if there are no bowel movements after 48 hours.  °5. Watch out for diarrhea. If you have many loose bowel movements, simplify your diet to bland foods & liquids for a few days. Stop any stool softeners and decrease your fiber supplement. Switching to mild anti-diarrheal medications (Kayopectate, Pepto Bismol) can help. If this worsens or does not improve, please call us. °6. Wash / shower every day. You may shower over the dressings as they are waterproof. Continue to shower over incision(s) after the dressing is off. °7. Remove your waterproof bandages 5 days after surgery. You may leave the incision open to air. You may replace a dressing/Band-Aid to cover the incision for comfort if you wish.  °8. ACTIVITIES as tolerated:  °1. You may resume regular (light) daily activities beginning the next day--such as daily self-care, walking, climbing stairs--gradually   increasing activities as tolerated. If you can walk 30 minutes without difficulty, it is safe to try more intense activity such as jogging, treadmill, bicycling, low-impact aerobics, swimming, etc. 2. Save the most intensive and strenuous activity for last such as sit-ups, heavy lifting, contact sports, etc Refrain  from any heavy lifting or straining until you are off narcotics for pain control.  3. DO NOT PUSH THROUGH PAIN. Let pain be your guide: If it hurts to do something, don't do it. Pain is your body warning you to avoid that activity for another week until the pain goes down. 4. You may drive when you are no longer taking prescription pain medication, you can comfortably wear a seatbelt, and you can safely maneuver your car and apply brakes. 5. You may have sexual intercourse when it is comfortable.  9. FOLLOW UP in our office  1. Please call [facility] at [phone] to set up an appointment to see your surgeon in the office for a follow-up appointment approximately 2-3 weeks after your surgery. 2. Make sure that you call for this appointment the day you arrive home to insure a convenient appointment time.      10. IF YOU HAVE DISABILITY OR FAMILY LEAVE FORMS, BRING THEM TO THE               OFFICE FOR PROCESSING.   WHEN TO CALL us [phone]:  1. Poor pain control 2. Reactions / problems with new medications (rash/itching, nausea, etc)  3. Fever over 101.5 F (38.5 C) 4. Inability to urinate 5. Nausea and/or vomiting 6. Worsening swelling or bruising 7. Continued bleeding from incision. 8. Increased pain, redness, or drainage from the incision  The clinic staff is available to answer your questions during regular business hours (8:30am-5pm). Please dont hesitate to call and ask to speak to one of our nurses for clinical concerns.  If you have a medical emergency, go to the nearest emergency room or call 911.  A surgeon from [facility] is always on call at the [facility], [place], [place], [place], [place] [address] ?  MAIN: [phone] ? TOLL FREE: [phone] ?  FAX [phone]  [email or web address]   [facility] Care Surgical drains are used to remove extra fluid that normally builds up in a surgical  wound after surgery. A surgical drain helps to heal a surgical wound. Different kinds of surgical drains include:  Active drains. These drains use suction to pull drainage away from the surgical wound. Drainage flows through a tube to a container outside of the body. It is important to keep the bulb or the drainage container flat (compressed) at all times, except while you empty it. Flattening the bulb or container creates suction. The two most common types of active drains are bulb drains and Hemovac drains.  Passive drains. These drains allow fluid to drain naturally, by gravity. Drainage flows through a tube to a bandage (dressing) or a container outside of the body. Passive drains do not need to be emptied. The most common type of passive drain is the Penrose drain. A drain is placed during surgery. Immediately after surgery, drainage is usually bright red and a little thicker than water. The drainage may gradually turn yellow or pink and become thinner. It is likely that your health care provider will remove the drain when the drainage stops or when the amount decreases to 1-2 Tbsp (15-30 mL) during a  24-hour period. How to care for your surgical drain  Keep the skin around the drain dry and covered with a dressing at all times.  Check your drain area every day for signs of infection. Check for:  More redness, swelling, or pain.  Pus or a bad smell.  Cloudy drainage. Follow instructions from your health care provider about how to take care of your drain and how to change your dressing. Change your dressing at least one time every day. Change it more often if needed to keep the dressing dry. Make sure you: 1. Gather your supplies, including:  Tape.  Germ-free cleaning solution (sterile saline).  Split gauze drain sponge: 4 x 4 inches (10 x 10 cm).  Gauze square: 4 x 4 inches (10 x 10 cm). 2. Wash your hands with soap and water before you change your dressing. If soap and water are not  available, use hand sanitizer. 3. Remove the old dressing. Avoid using scissors to do that. 4. Use sterile saline to clean your skin around the drain. 5. Place the tube through the slit in a drain sponge. Place the drain sponge so that it covers your wound. 6. Place the gauze square or another drain sponge on top of the drain sponge that is on the wound. Make sure the tube is between those layers. 7. Tape the dressing to your skin. 8. If you have an active bulb or Hemovac drain, tape the drainage tube to your skin 1-2 inches (2.5-5 cm) below the place where the tube enters your body. Taping keeps the tube from pulling on any stitches (sutures) that you have. 9. Wash your hands with soap and water. 10. Write down the color of your drainage and how often you change your dressing. How to empty your active bulb or Hemovac drain 1. Make sure that you have a measuring cup that you can empty your drainage into. 2. Wash your hands with soap and water. If soap and water are not available, use hand sanitizer. 3. Gently move your fingers down the tube while squeezing very lightly. This is called stripping the tube. This clears any drainage, clots, or tissue from the tube.  Do not pull on the tube.  You may need to strip the tube several times every day to keep the tube clear. 4. Open the bulb cap or the drain plug. Do not touch the inside of the cap or the bottom of the plug. 5. Empty all of the drainage into the measuring cup. 6. Compress the bulb or the container and replace the cap or the plug. To compress the bulb or the container, squeeze it firmly in the middle while you close the cap or plug the container. 7. Write down the amount of drainage that you have in each 24-hour period. If you have less than 2 Tbsp (30 mL) of drainage during 24 hours, contact your health care provider. 8. Flush the drainage down the toilet. 9. Wash your hands with soap and water. Contact a health care provider if:  You  have more redness, swelling, or pain around your drain area.  The amount of drainage that you have is increasing instead of decreasing.  You have pus or a bad smell coming from your drain area.  You have a fever.  You have drainage that is cloudy.  There is a sudden stop or a sudden decrease in the amount of drainage that you have.  Your tube falls out.  Your active draindoes not stay  compressedafter you empty it. This information is not intended to replace advice given to you by your health care provider. Make sure you discuss any questions you have with your health care provider. Document Released: 08/10/2000 Document Revised: 01/19/2016 Document Reviewed: 03/02/2015 Elsevier Interactive Patient Education  2017 [employer].

## 2016-11-08 NOTE — Progress Notes (Signed)
[facility] Progress Note  3 Days Post-Op  Subjective:  Cassandra Morris is a 66 year old female POD#2 s/p cholecystectomy with a drain in place.  She reports that she continues to improve. She notes mild tenderness over the midepigastric area that has diminished greatly. She has been able to tolerate her diet of soft foods and has had no nausea, vomiting. She reports passing gas and having a few normal bowel movements yesterday.   She reports minimal ambulation and use of spirometry and was encouraged to do both as much as tolerated. . Patient states willingness to be discharged today and appears well enough to do so.  Objective: Vital signs in last 24 hours: Temp:  [97.5 F (36.4 C)-98.9 F (37.2 C)] 97.5 F (36.4 C) (03/15 0459) Pulse Rate:  [65-74] 65 (03/15 0459) Resp:  [16-17] 17 (03/15 0459) BP: (114-128)/(41-50) 121/41 (03/15 0459) SpO2:  [95 %-97 %] 95 % (03/15 0459) Last BM Date: 11/07/16  Intake/Output from previous day: 03/14 0701 - 03/15 0700 In: 1100 [P.O.:300; I.V.:800] Out: 1600 [Urine:1500; Drains:100] Intake/Output this shift: No intake/output data recorded.  PE: Gen:  Alert, NAD, pleasant Card:  RRR, no murmurs gallops or rubs auscultated. Pulm:  CTA, no W/R/R Abd: Soft, and tender to palpation, normoactive bowel sounds, incisions are healing well, drain with minimal serosanguinous drainage. Ext:  No erythema, edema, or tenderness. Pulses 2+ and regular bilaterally  Lab Results:   Recent Labs  11/07/16 0759 11/08/16 0412  WBC 11.1* 9.6  HGB 9.4* 9.2*  HCT 29.8* 28.6*  PLT 250 232   BMET  Recent Labs  11/07/16 0759 11/08/16 0412  NA 135 136  K 3.1* 4.2  CL 101 104  CO2 26 28  GLUCOSE 136* 194*  BUN 8 5*  CREATININE 0.83 0.77  CALCIUM 7.6* 7.9*   PT/INR No results for input(s): LABPROT, INR in the last 72 hours. CMP     Component Value Date/Time   NA 136 11/08/2016 0412   K 4.2 11/08/2016 0412   CL 104 11/08/2016 0412    CO2 28 11/08/2016 0412   GLUCOSE 194 (H) 11/08/2016 0412   BUN 5 (L) 11/08/2016 0412   CREATININE 0.77 11/08/2016 0412   CALCIUM 7.9 (L) 11/08/2016 0412   PROT 5.6 (L) 11/06/2016 0748   ALBUMIN 2.4 (L) 11/06/2016 0748   AST 40 11/06/2016 0748   ALT 36 11/06/2016 0748   ALKPHOS 77 11/06/2016 0748   BILITOT 0.5 11/06/2016 0748   GFRNONAA >60 11/08/2016 0412   GFRAA >60 11/08/2016 0412   Lipase     Component Value Date/Time   LIPASE <10 (L) 11/04/2016 1600       Studies/Results: No results found.  Anti-infectives: Anti-infectives    Start     Dose/Rate Route Frequency Ordered Stop   11/07/16 1300  amoxicillin-clavulanate (AUGMENTIN) 875-125 MG per tablet 1 tablet     1 tablet Oral Every 12 hours 11/07/16 1237     11/05/16 1530  piperacillin-tazobactam (ZOSYN) IVPB 3.375 g  Status:  Discontinued     3.375 g 100 mL/hr over 30 Minutes Intravenous To Surgery 11/05/16 1524 11/05/16 1839   11/05/16 0600  piperacillin-tazobactam (ZOSYN) IVPB 3.375 g  Status:  Discontinued     3.375 g 12.5 mL/hr over 240 Minutes Intravenous Every 8 hours 11/04/16 2321 11/07/16 1237   11/04/16 2215  piperacillin-tazobactam (ZOSYN) IVPB 3.375 g     3.375 g 100 mL/hr over 30 Minutes Intravenous  Once 11/04/16 2202 11/04/16  2252       Assessment/Plan 1. Acute cholecystitis with calculous: -  Patient POD#4 s/p cholecystectomy. Appears well. Tolerated procedure. Her pain is well controlled - Encouraged to continue to ambulate and use spirometer as tolerated - Tolerating diet well - WBC normalized; Will continue Augmentin for 5 days postop with outpatient f/u.    LOS: 4 days    Roselle Locus , PA-S  Home today, I will see next week to take out drain if doing well

## 2016-11-08 NOTE — Discharge Summary (Signed)
Physician Discharge Summary  Cassandra Morris RXY:[id number] DOB: 1950-09-04 DOA: 11/04/2016  PCP: Cathlean Cower, MD  Admit date: 11/04/2016 Discharge date: 11/08/2016  Recommendations for Outpatient Follow-up:  Take Augmentin for 5 days on discharge Please note that we used lower dose Lantus in hospital for blood glucose control. If you eat normal full meal than you may resume your regular insulin dose.  Discharge Diagnoses:  Principal Problem:   Acute calculous cholecystitis Active Problems:   Depression   Type 2 diabetes mellitus with diabetic neuropathy, with long-term current use of insulin (HCC)   Hypokalemia    Discharge Condition: stable   Diet recommendation: as tolerated   History of present illness:  66 year old female with history of insulin dependent diabetes mellitus, hypertension (not on any medications), hyperlipidemia and depression who presented with one-week history of epigastric and right upper quadrant pain associated with nausea, vomiting and fever. Patient found to have acute cholecystitis and underwent cholecystectomy 11/05/2016.  Hospital Course:  Principal Problem: Acute calculous cholecystitis / leukocytosis - Status post cholecystectomy 11/05/2016 - Pt tolerates reg diet so far - Continue Zosyn per surgery through today and then augemntin for 5 days on discharge  - WBC count WNL this am  Active Problems: Type 2 diabetes mellitus with diabetic neuropathy, with long-term current use of insulin ([place]) - May resume home dose insulin if eating full meals at home    Prolonged QTc  - Holding zoloft and abilify - Repeat EKG showed normal QTC (456)    Depression - We were holding zoloftand abilify given recent prolonged Qt interval.  - Since QTc normal pt may resume those meds and instructed to follow up with PCP    Hypokalemia - Due to acute GI issues, cholecystitis - Supplemented - Potassium WNL this am    DVT prophylaxis: SCD's  bilaterally  Code Status: full code  Family Communication: no family at the bedside this am    Consultants:   Surgery   Procedures:   Cholecystectomy 11/05/2016   Antimicrobials:   Zosyn    Signed:  Leisa Lenz, MD  [facility] 11/08/2016, 2:30 PM  Pager #: [phone]  Time spent in minutes: less than 30 minutes    Discharge Exam: Vitals:   11/07/16 2042 11/08/16 0459  BP: (!) 114/46 (!) 121/41  Pulse: 68 65  Resp: 16 17  Temp: 98.6 F (37 C) 97.5 F (36.4 C)   Vitals:   11/07/16 0455 11/07/16 1532 11/07/16 2042 11/08/16 0459  BP: (!) 117/43 (!) 128/50 (!) 114/46 (!) 121/41  Pulse: 78 74 68 65  Resp: _0 Temp: 100.1 F (37.8 C) 98.9 F (37.2 C) 98.6 F (37 C) 97.5 F (36.4 C)  TempSrc: Oral Oral Oral Oral  SpO2: 94% 95% 97% 95%  Weight:      Height:        General: Pt is alert, follows commands appropriately, not in acute distress Cardiovascular: Regular rate and rhythm, S1/S2 +, no murmurs Respiratory: Clear to auscultation bilaterally, no wheezing, no crackles, no rhonchi Abdominal: Soft, non tender, non distended, bowel sounds +, no guarding Extremities: no edema, no cyanosis, pulses palpable bilaterally DP and PT Neuro: Grossly nonfocal  Discharge Instructions  Discharge Instructions    Call MD for:  persistant nausea and vomiting    Complete by:  As directed    Call MD for:  redness, tenderness, or signs of infection (pain, swelling, redness, odor or green/yellow discharge around incision site)    Complete  by:  As directed    Call MD for:  severe uncontrolled pain    Complete by:  As directed    Diet - low sodium heart healthy    Complete by:  As directed    Discharge instructions    Complete by:  As directed    Take Augmentin for 5 days on discharge Please note that we used lower dose Lantus in hospital for blood glucose control. If you eat normal full meal than you may resume your regular insulin dose.   Increase  activity slowly    Complete by:  As directed      Allergies as of 11/08/2016      Reactions   Trulicity [dulaglutide] Nausea And Vomiting   Metformin And Related Diarrhea      Medication List    TAKE these medications   ACCU-CHEK GUIDE w/Device Kit 1 each by Does not apply route daily.   acetaminophen 325 MG tablet Commonly known as:  TYLENOL Take 2 tablets (650 mg total) by mouth every 6 (six) hours as needed for fever.   amoxicillin-clavulanate 875-125 MG tablet Commonly known as:  AUGMENTIN Take 1 tablet by mouth every 12 (twelve) hours.   ARIPiprazole 5 MG tablet Commonly known as:  ABILIFY 1/2 tab by mouth once per day What changed:  how much to take  how to take this  when to take this  additional instructions   ARTIFICIAL TEARS 1.4 % ophthalmic solution Generic drug:  polyvinyl alcohol Place 1 drop into both eyes daily as needed for dry eyes.   calcium carbonate 750 MG chewable tablet Commonly known as:  TUMS EX Chew 1-2 tablets by mouth daily as needed for heartburn (acid indigestion).   glucose blood test strip Commonly known as:  ACCU-CHEK GUIDE Use as instructed to check sugar 3 times daily   ibuprofen 200 MG tablet Commonly known as:  ADVIL,MOTRIN Take 200 mg by mouth every 6 (six) hours as needed for headache (pain).   Insulin Glargine 100 UNIT/ML Solostar Pen Commonly known as:  LANTUS SOLOSTAR Inject 50 Units into the skin daily at 10 pm. What changed:  when to take this   insulin lispro 100 UNIT/ML KiwkPen Commonly known as:  HUMALOG KWIKPEN INJECT 15-30 UNITS 3 TIMES DAILY 15 MINUTES BEFORE MEALS What changed:  how much to take  how to take this  when to take this  additional instructions   MAGNESIUM PO Take 1 tablet by mouth daily.   sertraline 100 MG tablet Commonly known as:  ZOLOFT TAKE 2 TABLET BY MOUTH DAILY   TRUEPLUS LANCETS 33G Misc Use to test blood sugar 3 times daily. Dx: E11.65   accu-chek multiclix  lancets Use as instructed to check sugar 3 times daily   UNIFINE PENTIPS 31G X 8 MM Misc Generic drug:  Insulin Pen Needle USE 4 TIMES DAILY   VITAMIN B-12 SL Place 1 tablet under the tongue daily.   VITAMIN D3 PO Take 2 capsules by mouth daily.      Follow-up Information    WAKEFIELD,MATTHEW, MD. Schedule an appointment as soon as possible for a visit in 1 week(s).   Specialty:  General Surgery Why:  for follow up Contact information: [address] [place] [address] [phone]        Cathlean Cower, MD. Schedule an appointment as soon as possible for a visit in 1 week(s).   Specialties:  Internal Medicine, Radiology Contact information: [place]  [place] [address] [phone]        Leisa Lenz, MD Follow up.   Specialty:  Internal Medicine Why:  Please call me if any questions or concerns after discahrge. My cell number is [phone] Contact information: [place] [address] [place] [address] [phone]            The results of significant diagnostics from this hospitalization (including imaging, microbiology, ancillary and laboratory) are listed below for reference.    Significant Diagnostic Studies: Ct Abdomen Pelvis W Contrast  Result Date: 11/04/2016 CLINICAL DATA:  Vomiting, diarrhea EXAM: CT ABDOMEN AND PELVIS WITH CONTRAST TECHNIQUE: Multidetector CT imaging of the abdomen and pelvis was performed using the standard protocol following bolus administration of intravenous contrast. CONTRAST:  15m ISOVUE-300 IOPAMIDOL (ISOVUE-300) INJECTION 61% COMPARISON:  None. FINDINGS: Lower chest: Lung bases are clear. Hepatobiliary: Liver is within normal limits. Multiple gallstones with gallbladder wall thickening and surrounding inflammatory changes, worrisome for acute cholecystitis. No intrahepatic or extrahepatic ductal dilatation. Pancreas: Within normal limits. Spleen: Within normal limits. Adrenals/Urinary Tract:  Adrenal glands are within normal limits. Kidneys are within normal limits.  No hydronephrosis. Bladder is underdistended but unremarkable. Stomach/Bowel: Stomach is notable for a tiny hiatal hernia. No evidence of bowel obstruction. Normal appendix (series 201/ image 66). Vascular/Lymphatic: No evidence of abdominal aortic aneurysm. Atherosclerotic calcifications of the abdominal aorta and branch vessels. No suspicious abdominopelvic lymphadenopathy. Reproductive: Uterus is grossly unremarkable. Bilateral ovaries are unremarkable. Other: No abdominopelvic ascites. Musculoskeletal: Visualized osseous structures are within normal limits. IMPRESSION: Cholelithiasis with findings suspicious for acute cholecystitis. These results were called by telephone at the time of interpretation on 11/04/2016 at 9:57 pm to Dr. SVirgel Manifold, who verbally acknowledged these results. Electronically Signed   By: SJulian HyM.D.   On: 11/04/2016 21:57   [place]oreaAbdomen Limited Ruq  Result Date: 11/05/2016 CLINICAL DATA:  Right upper quadrant pain. EXAM: [place]ABDOMEN LIMITED - RIGHT UPPER QUADRANT COMPARISON:  CT 11/04/2016. FINDINGS: Gallbladder: Multiple gallstones noted measuring up to 2.5 cm. Gallbladder wall thickened at 3.8 mm. Positive Murphy sign. Common bile duct: Diameter: 4.7 mm Liver: Increased echogenicity consistent with fatty infiltration and/or hepatocellular disease. IMPRESSION: 1. Multiple gallstones measuring up to 2.5 cm. Gallbladder wall is thickened at 3.8 mm. Positive Murphy sign. Cholecystitis cannot be excluded. 2. Increased echogenicity liver consistent fatty infiltration and/or pedis 80 disease. Electronically Signed   By: TMarcello Moores Register   On: 11/05/2016 08:46    Microbiology: Recent Results (from the past 240 hour(s))  Surgical pcr screen     Status: None   Collection Time: 11/05/16  2:02 AM  Result Value Ref Range Status   MRSA, PCR NEGATIVE NEGATIVE Final   Staphylococcus aureus NEGATIVE  NEGATIVE Final    Comment:        The Xpert SA Assay (FDA approved for NASAL specimens in patients over 234years of age), is one component of a comprehensive surveillance program.  Test performance has been validated by [facility] patients greater than or equal to 122year old. It is not intended to diagnose infection nor to guide or monitor treatment.      Labs: Basic Metabolic Panel:  Recent Labs Lab 11/04/16 1600 11/05/16 0408 11/05/16 1000 11/06/16 0748 11/07/16 0759 11/08/16 0412  NA 132* 133*  --  135 135 136  K 3.6 3.4*  --  3.7 3.1* 4.2  CL 98* 102  --  105 101 104  CO2 22 23  --  21* 26 28  GLUCOSE 350* 239*  --  190* 136* 194*  BUN 17 17  --  15 8 5*  CREATININE 1.00 0.93  --  0.78 0.83 0.77  CALCIUM 8.1* 7.9*  --  7.7* 7.6* 7.9*  MG  --   --  1.7  --   --   --    Liver Function Tests:  Recent Labs Lab 11/04/16 1600 11/05/16 0408 11/06/16 0748  AST 15 19 40  ALT 11* 13* 36  ALKPHOS 76 71 77  BILITOT 0.9 0.9 0.5  PROT 6.9 6.3* 5.6*  ALBUMIN 3.0* 2.8* 2.4*    Recent Labs Lab 11/04/16 1600  LIPASE <10*   No results for input(s): AMMONIA in the last 168 hours. CBC:  Recent Labs Lab 11/04/16 1600 11/05/16 0408 11/06/16 0748 11/07/16 0759 11/08/16 0412  WBC 16.1* 18.1* 13.1* 11.1* 9.6  HGB 12.5 11.4* 10.2* 9.4* 9.2*  HCT 38.0 35.1* 33.2* 29.8* 28.6*  MCV 85.4 86.0 87.6 87.1 88.0  PLT 250 254 247 250 232   Cardiac Enzymes: No results for input(s): CKTOTAL, CKMB, CKMBINDEX, TROPONINI in the last 168 hours. BNP: BNP (last 3 results) No results for input(s): BNP in the last 8760 hours.  ProBNP (last 3 results) No results for input(s): PROBNP in the last 8760 hours.  CBG:  Recent Labs Lab 11/07/16 1149 11/07/16 1811 11/07/16 2047 11/08/16 0814 11/08/16 1203  GLUCAP 170* 199* 195* 208* 171*

## 2016-11-08 NOTE — Progress Notes (Signed)
Discharge instructions given, instructed and demonstrated to pt on how to empty the JP drain and how to change the dressing. Pt verbalized understanding. Discharged to home accompanied by brother.

## 2016-11-08 NOTE — Progress Notes (Signed)
Patient ID: Cassandra Morris, female   DOB: Jun 03, 1951, 66 y.o.   MRN: [id number]  PROGRESS NOTE    Margaretta Chittum Huster  CBS:[id number] DOB: [birth year] DOA: 11/04/2016  PCP: Cathlean Cower, MD   Brief Narrative:  66 year old female with history of insulin dependent diabetes mellitus, hypertension (not on any medications), hyperlipidemia and depression who presented with one-week history of epigastric and right upper quadrant pain associated with nausea, vomiting and fever. Patient found to have acute cholecystitis and underwent cholecystectomy 11/05/2016.  Assessment & Plan:   Principal Problem:   Acute calculous cholecystitis / leukocytosis - Status post cholecystectomy 11/05/2016 - Pt tolerates reg diet so far - Continue Zosyn per surgery - WBC count WNL this am  Active Problems:   Type 2 diabetes mellitus with diabetic neuropathy, with long-term current use of insulin (HCC) - Continue sliding scale insulin and Lantus but increase to 35 units at bedtime for better CBG control - CBG's in past 24 hours: 199, 195, 208   Prolonged QTc  - Holding zoloft and abilify - Repeat EKG showed normal QTC (456)    Depression - Continue to hold zoloft and abilify given recent prolonged Qt interval.      Hypokalemia - Due to acute GI issues, cholecystitis - Supplemented - Potassium WNL this am    DVT prophylaxis: SCD's bilaterally  Code Status: full code  Family Communication: no family at the bedside this am Disposition Plan: home once cleared by surgery    Consultants:   Surgery   Procedures:   Cholecystectomy 11/05/2016   Antimicrobials:   Zosyn    Subjective: Feels better this am.  Objective: Vitals:   11/07/16 0455 11/07/16 1532 11/07/16 2042 11/08/16 0459  BP: (!) 117/43 (!) 128/50 (!) 114/46 (!) 121/41  Pulse: 78 74 68 65  Resp: 17  16 17   Temp: 100.1 F (37.8 C) 98.9 F (37.2 C) 98.6 F (37 C) 97.5 F (36.4 C)  TempSrc: Oral Oral Oral Oral  SpO2: 94% 95% 97%  95%  Weight:      Height:        Intake/Output Summary (Last 24 hours) at 11/08/16 0934 Last data filed at 11/08/16 0600  Gross per 24 hour  Intake             1100 ml  Output             1600 ml  Net             -500 ml   Filed Weights   11/05/16 0042  Weight: 97 kg (213 lb 13.5 oz)    Examination:  General exam: No acute distress, calm and comfortable  Respiratory system: No wheezing, no rhonchi, bilateral air entry  Cardiovascular system: S1 & S2 heard, Rate controlled  Gastrointestinal system: (+) BS, non distended  Central nervous system: Nonfocal  Extremities: No erythema, palpable pulses bilateral  Skin: warm and dry  Psychiatry: No restlessness or agitation   Data Reviewed: I have personally reviewed following labs and imaging studies  CBC:  Recent Labs Lab 11/04/16 1600 11/05/16 0408 11/06/16 0748 11/07/16 0759 11/08/16 0412  WBC 16.1* 18.1* 13.1* 11.1* 9.6  HGB 12.5 11.4* 10.2* 9.4* 9.2*  HCT 38.0 35.1* 33.2* 29.8* 28.6*  MCV 85.4 86.0 87.6 87.1 88.0  PLT 250 254 247 250 846   Basic Metabolic Panel:  Recent Labs Lab 11/04/16 1600 11/05/16 0408 11/05/16 1000 11/06/16 0748 11/07/16 0759 11/08/16 0412  NA 132* 133*  --  135 135  136  K 3.6 3.4*  --  3.7 3.1* 4.2  CL 98* 102  --  105 101 104  CO2 22 23  --  21* 26 28  GLUCOSE 350* 239*  --  190* 136* 194*  BUN 17 17  --  15 8 5*  CREATININE 1.00 0.93  --  0.78 0.83 0.77  CALCIUM 8.1* 7.9*  --  7.7* 7.6* 7.9*  MG  --   --  1.7  --   --   --    GFR: Estimated Creatinine Clearance: 85.3 mL/min (by C-G formula based on SCr of 0.77 mg/dL). Liver Function Tests:  Recent Labs Lab 11/04/16 1600 11/05/16 0408 11/06/16 0748  AST 15 19 40  ALT 11* 13* 36  ALKPHOS 76 71 77  BILITOT 0.9 0.9 0.5  PROT 6.9 6.3* 5.6*  ALBUMIN 3.0* 2.8* 2.4*    Recent Labs Lab 11/04/16 1600  LIPASE <10*   No results for input(s): AMMONIA in the last 168 hours. Coagulation Profile:  Recent Labs Lab  11/04/16 2343  INR 1.21   Cardiac Enzymes: No results for input(s): CKTOTAL, CKMB, CKMBINDEX, TROPONINI in the last 168 hours. BNP (last 3 results) No results for input(s): PROBNP in the last 8760 hours. HbA1C: No results for input(s): HGBA1C in the last 72 hours. CBG:  Recent Labs Lab 11/07/16 0827 11/07/16 1149 11/07/16 1811 11/07/16 2047 11/08/16 0814  GLUCAP 130* 170* 199* 195* 208*   Lipid Profile: No results for input(s): CHOL, HDL, LDLCALC, TRIG, CHOLHDL, LDLDIRECT in the last 72 hours. Thyroid Function Tests: No results for input(s): TSH, T4TOTAL, FREET4, T3FREE, THYROIDAB in the last 72 hours. Anemia Panel: No results for input(s): VITAMINB12, FOLATE, FERRITIN, TIBC, IRON, RETICCTPCT in the last 72 hours. Urine analysis:    Component Value Date/Time   COLORURINE AMBER (A) 11/04/2016 2041   APPEARANCEUR HAZY (A) 11/04/2016 2041   LABSPEC 1.033 (H) 11/04/2016 2041   PHURINE 5.0 11/04/2016 2041   GLUCOSEU >=500 (A) 11/04/2016 2041   GLUCOSEU NEGATIVE 09/02/2015 1652   HGBUR NEGATIVE 11/04/2016 2041   HGBUR negative 11/29/2008 0843   BILIRUBINUR NEGATIVE 11/04/2016 2041   BILIRUBINUR n 04/20/2014 1705   KETONESUR NEGATIVE 11/04/2016 2041   PROTEINUR 100 (A) 11/04/2016 2041   UROBILINOGEN 0.2 09/02/2015 1652   NITRITE NEGATIVE 11/04/2016 2041   LEUKOCYTESUR NEGATIVE 11/04/2016 2041   Sepsis Labs: @LABRCNTIP (procalcitonin:4,lacticidven:4)    Surgical pcr screen     Status: None   Collection Time: 11/05/16  2:02 AM  Result Value Ref Range Status   MRSA, PCR NEGATIVE NEGATIVE Final   Staphylococcus aureus NEGATIVE NEGATIVE Final      Radiology Studies: Ct Abdomen Pelvis W Contrast Result Date: 11/04/2016 Cholelithiasis with findings suspicious for acute cholecystitis.   US Abdomen Limited Ruq Result Date: 11/05/2016 1. Multiple gallstones measuring up to 2.5 cm. Gallbladder wall is thickened at 3.8 mm. Positive Murphy sign. Cholecystitis cannot be  excluded. 2. Increased echogenicity liver consistent fatty infiltration and/or pedis 80 disease.    Scheduled Meds: . amoxicillin-clavulanate  1 tablet Oral Q12H  . insulin aspart  0-15 Units Subcutaneous TID WC  . insulin aspart  0-5 Units Subcutaneous QHS  . insulin glargine  25 Units Subcutaneous Once  . insulin glargine  25 Units Subcutaneous QHS   Continuous Infusions: . sodium chloride 50 mL/hr at 11/08/16 0600     LOS: 4 days    Time spent: 15 minutes  Greater than 50% of the time spent on counseling and  coordinating the care.   Leisa Lenz, MD [facility] Pager [phone]  If 7PM-7AM, please contact night-coverage [email or web address] Password [facility] 11/08/2016, 9:34 AM

## 2016-11-09 ENCOUNTER — Other Ambulatory Visit: Payer: Self-pay | Admitting: *Deleted

## 2016-11-09 NOTE — Patient Outreach (Signed)
Completed first transition of care phone call. Cassandra Morris was admitted to [facility] on 3/11 after initially  presenting to [facility] then sent to [facility]  ED with persistent upper abdominal pain with nausea, vomiting and diarrhea with maximum temp of 103, WBC= 16.1. Dx= Acute calculous cholecystitis per CT of abdomen. See transition of care template details. [name] reports her recovery is going well except for some diarrhea. She was encouraged to avoid high fat foods to assist with controlling diarrhea after having her gall bladder removed. She denies fever or chills. She says her pain is well controlled with over the counter Motrin.  She has not checked her blood sugar since returning home on 11/08/16. She is a participate in the [employer] for DM self management. No blood sugar readings have been recorded in [facility] since 7:37 pm on 3/8 . Reviewed discharge instruction that stated she can resume her usual dose of insulins, both Lantus and humalog, when she is eating regular portioned meals.  She says she has a JP drain and will see her surgeon on Monday 3/19 for removal of the drain and post op assessment. Her brother will provide transportation as she is not to drive until she sees her surgeon on Monday.   She says her surgical puncture sites are unremarkable and open to air.  Her brother and sister in law are staying with her to assist in her recovery as she lives alone.  Advised [name] this RNCM will contact her again next week for the second transition of care phone call to assess her ongoing recovery from surgery.   Barrington Ellison RN,CCM,CDE [place] Management Coordinator [facility] To Well[facility] Office Phone [phone] Office Fax [phone]

## 2016-11-16 ENCOUNTER — Other Ambulatory Visit: Payer: Self-pay | Admitting: *Deleted

## 2016-11-16 NOTE — Patient Outreach (Addendum)
Second transition of care call completed.  Cassandra Morris states she just returned home from her brother and sister in [place] where she was recovering from gallbladder surgery on 11/05/16. Cassandra Morris says she is doing well and will return to work on 3/26 for 4 hours initially and then build back up to full time. She says she saw her surgeon on 3/19 and her JP drain was removed and he told her she was doing well.  She asked this RNCM about removal of her incisional steri strips and she was told she could remove them as they loosen. She says her incision stab wound are without redness or drainage or induration.  [facility] data shows she checked her blood sugar once yesterday and it was elevated at 264 post lunch so she says she will get back on track now that she is home. She says her daughter has her activity tracker so not to expect to see activity recorded in the app from the tracker. Will monitor her DM management including blood sugar readings, daily activity step goals, medication taking behavior and weekly weights through the [facility] app.  She says she has all her medications as listed on her discharge instructions. Says she is still having a little diarrhea but that is to be expected now that she has no gallbladder. Again advised her to avoid fatty foods to reduce likelihood of worsening diarrhea. Will continue to monitor Cassandra Morris's diabetes self Per transition of care protocol , will contact Cassandra Morris in one week to assess her ongoing recovery from surgery.  Barrington Ellison RN,CCM,CDE [place] Management Coordinator [facility] To Well[facility] Office Phone [phone] Office Fax [phone]

## 2016-11-20 ENCOUNTER — Encounter: Payer: Self-pay | Admitting: Internal Medicine

## 2016-11-23 ENCOUNTER — Other Ambulatory Visit: Payer: Self-pay | Admitting: *Deleted

## 2016-11-23 NOTE — Patient Outreach (Signed)
Attempted to complete the 3rd transition of care phone call to [place] but the recording on her mobile number stated a voice mail box has not been set up so unable to leave message. Also, sent Rabiah a secure e-mail to her [place] e-mail address asking if her recovery from  gallbladder surgery remains unremarkable. Await response from [place]. Barrington Ellison RN,CCM,CDE [place] Management Coordinator [facility] To Wellness Office Phone [phone] Office Fax [phone]

## 2016-11-29 MED FILL — HUMALOG 100 UNITS/ML KWIKPE: 100 | 30 days supply | Qty: 15 | Fill #3

## 2016-12-18 ENCOUNTER — Telehealth: Payer: 59 | Admitting: Family

## 2016-12-18 DIAGNOSIS — B9689 Other specified bacterial agents as the cause of diseases classified elsewhere: Secondary | ICD-10-CM | POA: Diagnosis not present

## 2016-12-18 DIAGNOSIS — J329 Chronic sinusitis, unspecified: Secondary | ICD-10-CM

## 2016-12-18 MED ORDER — AZITHROMYCIN 250 MG PO TABS: ORAL_TABLET | ORAL | 0 refills | Status: DC

## 2016-12-18 MED ORDER — BENZONATATE 100 MG PO CAPS: 100.0000 mg | ORAL_CAPSULE | Freq: Three times a day (TID) | ORAL | 0 refills | Status: DC | PRN

## 2016-12-18 MED FILL — AZITHROMYCIN 250 MG TABLET: 250 | 6 days supply | Qty: 6 | Fill #0

## 2016-12-18 MED FILL — BENZONATATE 100 MG CAP: 100 | 5 days supply | Qty: 30 | Fill #0

## 2016-12-18 NOTE — Progress Notes (Signed)

## 2016-12-21 ENCOUNTER — Encounter: Payer: Self-pay | Admitting: Internal Medicine

## 2016-12-21 ENCOUNTER — Ambulatory Visit (INDEPENDENT_AMBULATORY_CARE_PROVIDER_SITE_OTHER): Payer: 59 | Admitting: Internal Medicine

## 2016-12-21 VITALS — BP 116/56 | HR 100 | Resp 16 | Wt 209.0 lb

## 2016-12-21 DIAGNOSIS — Z794 Long term (current) use of insulin: Secondary | ICD-10-CM

## 2016-12-21 DIAGNOSIS — E1140 Type 2 diabetes mellitus with diabetic neuropathy, unspecified: Secondary | ICD-10-CM

## 2016-12-21 LAB — POCT GLYCOSYLATED HEMOGLOBIN (HGB A1C): Hemoglobin A1C: 8.2

## 2016-12-21 NOTE — Progress Notes (Signed)
Subjective:     Patient ID: Cassandra Morris, female   DOB: 05/16/1951, 66 y.o.   MRN: [id number]  HPI Cassandra Morris is a pleasant 66 y.o. woman, returning for f/u for DM2, dx 2011, insulin-dependent, uncontrolled, with complications (PN). Last visit 3 mo ago.  She is on [employer] pgm.  She had acalculous cholecystitis since last visit (10/2016) >> had cholecystectomy. Her sugars have increased after this << she developed a sinus infection. She is now finishing an antibiotic course.  Lab Results  Component Value Date   HGBA1C 7.6 09/24/2016   HGBA1C 10.8 06/12/2016   HGBA1C 10.3 03/12/2016   HGBA1C 9.9 12/12/2015   HGBA1C 11.1 (H) 09/02/2015   HGBA1C 8.8 (H) 02/24/2015   HGBA1C 8.8 (H) 11/29/2014   HGBA1C 7.4 (H) 07/28/2014   HGBA1C 12.0 (H) 04/20/2014   HGBA1C 8.3 (H) 05/05/2013   HGBA1C 11.9 (H) 10/24/2012   HGBA1C 10.0 (H) 09/26/2010   HGBA1C 8.4 (H) 03/16/2010   HGBA1C 6.5 11/29/2008   HGBA1C 6.4 (H) 08/09/2008   She is now on a regimen of: Lantus 50 >> 40 >> 50 >> 60 units after dinner Humalog: 10-18 units with meals SSI of Humalog (not using much) - 150- 165: + 1 unit  - 166- 180: + 2 units  - 181- 195: + 3 units  - 196- 210: + 4 units  - 211- 225: + 5 units  - 226- 240: + 6 units  - 241- 255: + 7 units  - 256- 270: + 8 units  - 271- 285: + 9 units  - > 286: + 10 units  Trulicity 7.67 mg weekly >> 1.5 mg >> Nausea >> stopped. She stopped Metformin XR 500 mg 2x a day >> diarrhea >> stopped.  She checks her sugars several times a day - 2x a day + meter download: - am:  110-150 >> 300-400s >> 171-381 >> 274-452 >>290-436 >> 97-211, 247 >> 120-180 >> 171-359 - 2h after b'fast: 127-190s, 237 >> n/c >> 210, 273 >> 362-464 >> 211-425 >> 95-199, 220 >> n/c  - prelunch:117-193, 217 >> n/c >> 104-259, 382 >> 339, 452 >> 86, 185, 336 >>  80-223 >> 130-180 >> 169-282 - 2h postlunch 62, 92-178 >> 250-350 >> 130-289 >> 99 >> 194-390 >> 123-216 >> n/c - predinner: 89, 182 >>  79-155, 170 >> n/c >> 98-295 >> 246 >> n/c >> 80-134, 145 >> 120-150 >> 67 x1, 184-328 - 2h post dinner:  200 >> 285 >> 123-168 >> n/c >> 88 >> n/c >> 196-168 - bedtime:  242 >> 144-195 >> n/c >> 122, 254 >> n/c Lows: 66 x1 >> 86 >> 98 >> 67 x1. Highest: 500 >> 400s >> 202 >> <200 >> 300s  Glucometer: OneTouch Verio  - No chronic kidney disease: Lab Results  Component Value Date   TSH 2.26 09/02/2015   CREATININE 0.77 11/08/2016  - No neuropathic symptoms - no HL: Lab Results  Component Value Date   CHOL 228 (H) 09/02/2015   HDL 38.70 (L) 09/02/2015   LDLCALC 61 07/16/2014   LDLDIRECT 151.0 09/02/2015   TRIG 288.0 (H) 09/02/2015   CHOLHDL 6 09/02/2015  - She had an eye exam in 05/2016. Dr Katy Fitch. She has a h/o iritis. Had cataract sx.   She also has hypertension, hyperlipidemia, obesity. She has severe depression. Pt has severe depression, which she has every summer >> cannot control her sugars well.  She saw a psychiatrist in 11/2013 >> Depakote  and Lamictal >> she tapered these off as she felt terrible on these >> She now sees and other psychiatrist >> Zoloft and Xanax.   I reviewed pt's medications, allergies, PMH, social hx, family hx, and changes were documented in the history of present illness. Otherwise, unchanged from my initial visit note.  Review of Systems Constitutional: no weight gain, no  fatigue, no subjective hyperthermia, + nocturia Eyes: no blurry vision, no xerophthalmia ENT: no sore throat, no nodules palpated in throat, no dysphagia/odynophagia no hoarseness Cardiovascular: no CP/SOB/palpitations/leg swelling Respiratory: + cough/no SOB Gastrointestinal: no N/V/D/C/+ heartburn Musculoskeletal: no muscle aches/no joint aches Neuro: no HAs, no tremors  Objective:   Physical Exam BP (!) 116/56   Pulse 100   Resp 16   Wt 209 lb (94.8 kg)   SpO2 96%   BMI 31.78 kg/m  Body mass index is 31.78 kg/m. Wt Readings from Last 3 Encounters:  12/21/16 209 lb  (94.8 kg)  11/05/16 213 lb 13.5 oz (97 kg)  09/24/16 214 lb (97.1 kg)  Constitutional: overweight, in NAD Eyes: unequal pupils L>R - surgical R pupil, EOMI, no exophthalmos ENT: moist mucous membranes, no thyromegaly, no cervical lymphadenopathy Cardiovascular: Tachycardia, RR, No MRG Respiratory: CTA B Gastrointestinal: abdomen soft, NT, ND, BS+ Musculoskeletal: no deformities, strength intact in all 4 Neuro:  No tremor with outstretched hands Skin: moist, warm, no rashes  Assessment:     1. DM2, insulin-dependent, uncontrolled, with complications - PN  Plan:     Patient with poorly controlled diabetes, with fluctuating level of control, depending on the time of the year. She is usually more depressed during the summer because she is getting very hot. She was doing better at last visit, however, sugars increased after her cystectomy. Right after the surgery, she started to feel better, and less hot than before. However, she developed sinusitis, and her sugars started to increase. She was started on antibiotics, but the sugars are still very high. We will need to increase the doses of her insulin today, but I did advise her to start decreasing the doses back when she sees improvement in her CBGs during resolution of her URI. - She continues in the [facility] program - We need to stay off Metformin, unfortunately, 2/2 diarrhea. - last HbA1c was 10.8% >> now higher: 8.2%.  - I advised her to: Patient Instructions  Please continue: - Lantus 60 units at bedtime  Please increase: - Humalog: 15 units with a smaller meal 20 units with a larger meal (25 units if you go out to eat or have dessert)  Please come back for a follow-up appointment in 3 months.   - She is up-to-date with her eye exams - She will schedule her annual exam with her PCP - Return in about 3 months (around 03/22/2017).  Philemon Kingdom, MD PhD [facility] Endocrinology

## 2016-12-21 NOTE — Patient Instructions (Signed)
Please continue: - Lantus 60 units at bedtime  Please increase: - Humalog: 15 units with a smaller meal 20 units with a larger meal (25 units if you go out to eat or have dessert)  Please come back for a follow-up appointment in 3 months.

## 2017-02-01 NOTE — Addendum Note (Signed)
Addendum  created 02/01/17 1051 by Lyn Hollingshead, MD   Sign clinical note

## 2017-02-08 MED FILL — LANTUS SOLOSTAR 100 UNITS/M: 100 | 60 days supply | Qty: 30 | Fill #2

## 2017-02-08 MED FILL — HUMALOG 100 UNITS/ML KWIKPE: 100 | 30 days supply | Qty: 15 | Fill #4

## 2017-02-08 MED FILL — ACCU-CHEK GUIDE TEST STRIP: 90 days supply | Qty: 300 | Fill #1

## 2017-02-08 MED FILL — ARIPiprazole 5 MG TABS: 5 | 90 days supply | Qty: 45 | Fill #3

## 2017-02-08 MED FILL — SERTRALINE HCL 100 MG TAB: 100 | 90 days supply | Qty: 180 | Fill #1

## 2017-04-09 ENCOUNTER — Encounter: Payer: Self-pay | Admitting: Internal Medicine

## 2017-04-09 ENCOUNTER — Ambulatory Visit: Payer: 59 | Admitting: Internal Medicine

## 2017-04-19 ENCOUNTER — Other Ambulatory Visit: Payer: Self-pay | Admitting: Internal Medicine

## 2017-04-19 MED FILL — LANTUS SOLOSTAR 100 UNITS/M: 100 | 60 days supply | Qty: 30 | Fill #0

## 2017-04-19 MED FILL — HUMALOG 100 UNITS/ML KWIKPE: 100 | 30 days supply | Qty: 15 | Fill #5

## 2017-06-03 ENCOUNTER — Telehealth: Payer: 59 | Admitting: Nurse Practitioner

## 2017-06-03 DIAGNOSIS — J0100 Acute maxillary sinusitis, unspecified: Secondary | ICD-10-CM | POA: Diagnosis not present

## 2017-06-03 MED ORDER — AMOXICILLIN-POT CLAVULANATE 875-125 MG PO TABS: 1.0000 | ORAL_TABLET | Freq: Two times a day (BID) | ORAL | 0 refills | Status: DC

## 2017-06-03 MED FILL — AMOX-CLAV 875-125 MG TABLET: 875-125 | 7 days supply | Qty: 14 | Fill #0

## 2017-06-03 NOTE — Progress Notes (Signed)

## 2017-06-13 ENCOUNTER — Other Ambulatory Visit: Payer: Self-pay | Admitting: Internal Medicine

## 2017-06-13 MED FILL — SERTRALINE HCL 100 MG TAB: 100 | 90 days supply | Qty: 180 | Fill #2

## 2017-06-13 NOTE — Telephone Encounter (Signed)
Done erx  Please ask pt to make ROV for further refills 

## 2017-06-14 MED FILL — ARIPiprazole 5 MG TABS: 5 | 30 days supply | Qty: 15 | Fill #0

## 2017-06-28 ENCOUNTER — Other Ambulatory Visit: Payer: Self-pay | Admitting: Internal Medicine

## 2017-06-28 MED FILL — ACCU-CHEK GUIDE TEST STRIP: 90 days supply | Qty: 300 | Fill #2

## 2017-06-28 MED FILL — LANTUS SOLOSTAR 100 UNITS/M: 100 | 60 days supply | Qty: 30 | Fill #1

## 2017-07-03 ENCOUNTER — Other Ambulatory Visit: Payer: Self-pay

## 2017-07-03 MED ORDER — ACCU-CHEK FASTCLIX LANCETS MISC: 5 refills | Status: AC

## 2017-07-03 MED ORDER — ACCU-CHEK FASTCLIX LANCET KIT: PACK | 0 refills | Status: AC

## 2017-07-03 MED ORDER — INSULIN LISPRO 100 UNIT/ML (KWIKPEN): PEN_INJECTOR | SUBCUTANEOUS | 5 refills | Status: DC

## 2017-07-03 MED FILL — ACCU-CHEK FASTCLIX LANCETS: 90 days supply | Qty: 306 | Fill #0

## 2017-07-03 MED FILL — ACCU-CHEK FASTCLIX LANCET K: 30 days supply | Qty: 1 | Fill #0

## 2017-07-23 DIAGNOSIS — H35373 Puckering of macula, bilateral: Secondary | ICD-10-CM | POA: Diagnosis not present

## 2017-07-23 DIAGNOSIS — D3132 Benign neoplasm of left choroid: Secondary | ICD-10-CM | POA: Diagnosis not present

## 2017-07-23 DIAGNOSIS — E119 Type 2 diabetes mellitus without complications: Secondary | ICD-10-CM | POA: Diagnosis not present

## 2017-07-23 DIAGNOSIS — Z961 Presence of intraocular lens: Secondary | ICD-10-CM | POA: Diagnosis not present

## 2017-07-23 LAB — HM DIABETES EYE EXAM

## 2017-07-25 ENCOUNTER — Other Ambulatory Visit: Payer: Self-pay | Admitting: *Deleted

## 2017-07-25 NOTE — Patient Outreach (Signed)
Cassandra Morris transitioned from the [employer] [facility] program to the [employer] on 09/13/16 for Type II diabetes self-management assistance so will close case to the diabetes [facility] program due to delegation of disease management services to [employer] from [employer] for [place] members in 2019. Barrington Ellison RN,CCM,CDE [place] Management Coordinator [facility] and [employer] [phone] Office Fax [phone]

## 2017-11-27 ENCOUNTER — Telehealth: Payer: 59 | Admitting: Nurse Practitioner

## 2017-11-27 DIAGNOSIS — J0100 Acute maxillary sinusitis, unspecified: Secondary | ICD-10-CM

## 2017-11-27 MED ORDER — AMOXICILLIN-POT CLAVULANATE 875-125 MG PO TABS: 1.0000 | ORAL_TABLET | Freq: Two times a day (BID) | ORAL | 0 refills | Status: DC

## 2017-11-27 MED FILL — AMOX-CLAV 875-125 MG TABLET: 875-125 | 7 days supply | Qty: 14 | Fill #0

## 2017-11-27 NOTE — Progress Notes (Signed)

## 2017-12-18 ENCOUNTER — Ambulatory Visit (INDEPENDENT_AMBULATORY_CARE_PROVIDER_SITE_OTHER): Payer: 59 | Admitting: Internal Medicine

## 2017-12-18 ENCOUNTER — Other Ambulatory Visit: Payer: Self-pay | Admitting: Internal Medicine

## 2017-12-18 ENCOUNTER — Encounter: Payer: Self-pay | Admitting: Internal Medicine

## 2017-12-18 ENCOUNTER — Other Ambulatory Visit (INDEPENDENT_AMBULATORY_CARE_PROVIDER_SITE_OTHER): Payer: 59

## 2017-12-18 VITALS — BP 142/86 | HR 88 | Temp 98.7°F | Ht 68.0 in | Wt 199.0 lb

## 2017-12-18 DIAGNOSIS — F3289 Other specified depressive episodes: Secondary | ICD-10-CM

## 2017-12-18 DIAGNOSIS — R5383 Other fatigue: Secondary | ICD-10-CM

## 2017-12-18 DIAGNOSIS — D649 Anemia, unspecified: Secondary | ICD-10-CM

## 2017-12-18 DIAGNOSIS — E1140 Type 2 diabetes mellitus with diabetic neuropathy, unspecified: Secondary | ICD-10-CM | POA: Diagnosis not present

## 2017-12-18 DIAGNOSIS — Z1159 Encounter for screening for other viral diseases: Secondary | ICD-10-CM

## 2017-12-18 DIAGNOSIS — Z0000 Encounter for general adult medical examination without abnormal findings: Secondary | ICD-10-CM

## 2017-12-18 DIAGNOSIS — Z794 Long term (current) use of insulin: Secondary | ICD-10-CM

## 2017-12-18 DIAGNOSIS — Z0001 Encounter for general adult medical examination with abnormal findings: Secondary | ICD-10-CM

## 2017-12-18 LAB — CBC WITH DIFFERENTIAL/PLATELET
BASOS PCT: 0.9 % (ref 0.0–3.0)
Basophils Absolute: 0.1 10*3/uL (ref 0.0–0.1)
Eosinophils Absolute: 0.2 10*3/uL (ref 0.0–0.7)
Eosinophils Relative: 2.3 % (ref 0.0–5.0)
HCT: 44.6 % (ref 36.0–46.0)
HEMOGLOBIN: 14.8 g/dL (ref 12.0–15.0)
Lymphocytes Relative: 44.6 % (ref 12.0–46.0)
Lymphs Abs: 4.5 10*3/uL — ABNORMAL HIGH (ref 0.7–4.0)
MCHC: 33.3 g/dL (ref 30.0–36.0)
MCV: 85 fl (ref 78.0–100.0)
MONO ABS: 0.5 10*3/uL (ref 0.1–1.0)
MONOS PCT: 5.1 % (ref 3.0–12.0)
Neutro Abs: 4.7 10*3/uL (ref 1.4–7.7)
Neutrophils Relative %: 47.1 % (ref 43.0–77.0)
Platelets: 255 10*3/uL (ref 150.0–400.0)
RBC: 5.24 Mil/uL — AB (ref 3.87–5.11)
RDW: 13.1 % (ref 11.5–15.5)
WBC: 10.1 10*3/uL (ref 4.0–10.5)

## 2017-12-18 LAB — BASIC METABOLIC PANEL
BUN: 13 mg/dL (ref 6–23)
CALCIUM: 9.6 mg/dL (ref 8.4–10.5)
CO2: 27 mEq/L (ref 19–32)
Chloride: 96 mEq/L (ref 96–112)
Creatinine, Ser: 0.86 mg/dL (ref 0.40–1.20)
GFR: 70.01 mL/min (ref >60.00)
Glucose, Bld: 406 mg/dL — ABNORMAL HIGH (ref 70–99)
POTASSIUM: 4.5 meq/L (ref 3.5–5.1)
SODIUM: 132 meq/L — AB (ref 135–145)

## 2017-12-18 LAB — LIPID PANEL
CHOLESTEROL: 229 mg/dL — AB (ref 0–200)
HDL: 40.9 mg/dL (ref >39.00)
NonHDL: 188.57
TRIGLYCERIDES: 292 mg/dL — AB (ref 0.0–149.0)
Total CHOL/HDL Ratio: 6
VLDL: 58.4 mg/dL — AB (ref 0.0–40.0)

## 2017-12-18 LAB — HEPATIC FUNCTION PANEL
ALBUMIN: 4 g/dL (ref 3.5–5.2)
ALT: 15 U/L (ref 0–35)
AST: 12 U/L (ref 0–37)
Alkaline Phosphatase: 90 U/L (ref 39–117)
Bilirubin, Direct: 0.1 mg/dL (ref 0.0–0.3)
TOTAL PROTEIN: 7.1 g/dL (ref 6.0–8.3)
Total Bilirubin: 0.4 mg/dL (ref 0.2–1.2)

## 2017-12-18 LAB — URINALYSIS, ROUTINE W REFLEX MICROSCOPIC
BILIRUBIN URINE: NEGATIVE
HGB URINE DIPSTICK: NEGATIVE
Ketones, ur: NEGATIVE
LEUKOCYTES UA: NEGATIVE
Nitrite: NEGATIVE
RBC / HPF: NONE SEEN (ref 0–?)
Specific Gravity, Urine: 1.015 (ref 1.000–1.030)
TOTAL PROTEIN, URINE-UPE24: NEGATIVE
Urine Glucose: >=1000 — AB
Urobilinogen, UA: 0.2 (ref 0.0–1.0)
pH: 5.5 (ref 5.0–8.0)

## 2017-12-18 LAB — MICROALBUMIN / CREATININE URINE RATIO
Creatinine,U: 55.4 mg/dL
MICROALB/CREAT RATIO: 1.3 mg/g (ref 0.0–30.0)
Microalb, Ur: <0.7 mg/dL (ref 0.0–1.9)

## 2017-12-18 LAB — VITAMIN B12: Vitamin B-12: >1500 pg/mL — ABNORMAL HIGH (ref 211–911)

## 2017-12-18 LAB — LDL CHOLESTEROL, DIRECT: LDL DIRECT: 157 mg/dL

## 2017-12-18 LAB — HEMOGLOBIN A1C: HEMOGLOBIN A1C: 11.6 % — AB (ref 4.6–6.5)

## 2017-12-18 LAB — VITAMIN D 25 HYDROXY (VIT D DEFICIENCY, FRACTURES): VITD: 35.98 ng/mL (ref 30.00–100.00)

## 2017-12-18 LAB — TSH: TSH: 3.8 u[IU]/mL (ref 0.35–4.50)

## 2017-12-18 MED ORDER — INSULIN GLARGINE 100 UNIT/ML SOLOSTAR PEN: 50.0000 [IU] | PEN_INJECTOR | Freq: Every day | SUBCUTANEOUS | 2 refills | Status: DC

## 2017-12-18 MED ORDER — INSULIN LISPRO 100 UNIT/ML (KWIKPEN): PEN_INJECTOR | SUBCUTANEOUS | 2 refills | Status: DC

## 2017-12-18 MED ORDER — ATORVASTATIN CALCIUM 40 MG PO TABS: 40.0000 mg | ORAL_TABLET | Freq: Every day | ORAL | 3 refills | Status: AC

## 2017-12-18 MED FILL — HUMALOG 100 UNITS/ML KWIKPE: 100 | 17 days supply | Qty: 15 | Fill #0

## 2017-12-18 MED FILL — LANTUS SOLOSTAR 100 UNITS/M: 100 | 60 days supply | Qty: 30 | Fill #0

## 2017-12-18 NOTE — Patient Instructions (Addendum)
You will be contacted regarding the referral for: colonoscopy, and Psychiatry  Please continue all other medications as before, and refills have been done if requested, including restarting the diabetic medications  Please have the pharmacy call with any other refills you may need.  Please continue your efforts at being more active, low cholesterol diet, and weight control.  You are otherwise up to date with prevention measures today.  Please keep your appointments with your specialists as you may have planned  Please go to the LAB in the Basement (turn left off the elevator) for the tests to be done today   You will be contacted by phone if any changes need to be made immediately.  Otherwise, you will receive a letter about your results with an explanation, but please check with MyChart first.  Please remember to sign up for MyChart if you have not done so, as this will be important to you in the future with finding out test results, communicating by private email, and scheduling acute appointments online when needed.  Please return in 1 year for your yearly visit, or sooner if needed, with Lab testing done 3-5 days before

## 2017-12-18 NOTE — Progress Notes (Signed)
Subjective:    Patient ID: Cassandra Morris, female    DOB: 06/23/1951, 67 y.o.   MRN: [id number]  HPI  Here for wellness and f/u;  Overall doing ok;  Pt denies Chest pain, worsening SOB, DOE, wheezing, orthopnea, PND, worsening LE edema, palpitations, dizziness or syncope.  Pt denies neurological change such as new headache, facial or extremity weakness.  Pt denies polydipsia, polyuria, or low sugar symptoms. Pt states overall good compliance with treatment and medications, good tolerability, and has been trying to follow appropriate diet.  Pt denies worsening depressive symptoms, suicidal ideation or panic. No fever, night sweats, wt loss, loss of appetite, or other constitutional symptoms.  Pt states good ability with ADL's, has low fall risk, home safety reviewed and adequate, no other significant changes in hearing or vision, and not active with exercise.  Has not taken DM meds recently, needs refills now, plans to f/u with endo  Dr Cruzita Lederer.   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   Seeing therapist for depression, wants to see psychiatry as well.   Did have some recent GI upset, ? Viral illness now resolve but has lost some wt.  Does c/o ongoing fatigue, but denies signficant daytime hypersomnolence.  Did also have mild ABL anemia post CCX last yr, no overt bleeding since. Wt Readings from Last 3 Encounters:  12/18/17 199 lb (90.3 kg)  12/21/16 209 lb (94.8 kg)  11/05/16 213 lb 13.5 oz (97 kg)  No other interval change or new complaint Past Medical History:  Diagnosis Date  . Depression   . Diabetes mellitus    T2DM  . Hyperlipidemia   . Hypertension   . Obesity (BMI 30-39.9)    Past Surgical History:  Procedure Laterality Date  . CHOLECYSTECTOMY N/A 11/05/2016   Procedure: LAPAROSCOPIC CHOLECYSTECTOMY;  Surgeon: Rolm Bookbinder, MD;  Location: [place];  Service: General;  Laterality: N/A;    reports that she quit smoking about 33 years  ago. Her smoking use included cigarettes. She has a 2.25 pack-year smoking history. She has never used smokeless tobacco. She reports that she drinks alcohol. She reports that she does not use drugs. family history includes Allergies in her mother; Asthma in her mother; Deep vein thrombosis in her mother; Diabetes in her mother; Heart disease in her mother. Allergies  Allergen Reactions  . Trulicity [Dulaglutide] Nausea And Vomiting  . Metformin And Related Diarrhea   Current Outpatient Medications on File Prior to Visit  Medication Sig Dispense Refill  . ACCU-CHEK FASTCLIX LANCETS MISC Use to check sugar 3 times daily 300 each 5  . acetaminophen (TYLENOL) 325 MG tablet Take 2 tablets (650 mg total) by mouth every 6 (six) hours as needed for fever. 30 tablet 0  . ARIPiprazole (ABILIFY) 5 MG tablet TAKE 1/2 TABLET BY MOUTH ONCE DAILY 15 tablet 0  . Blood Glucose Monitoring Suppl (ACCU-CHEK GUIDE) w/Device KIT 1 each by Does not apply route daily. 1 kit 0  . calcium carbonate (TUMS EX) 750 MG chewable tablet Chew 1-2 tablets by mouth daily as needed for heartburn (acid indigestion).    . Cholecalciferol (VITAMIN D3 PO) Take 2 capsules by mouth daily.    . Cyanocobalamin (VITAMIN B-12 SL) Place 1 tablet under the tongue daily.    Marland Kitchen glucose blood (ACCU-CHEK GUIDE) test strip Use as instructed to check sugar 3 times daily 300 each 5  . ibuprofen (ADVIL,MOTRIN) 200 MG tablet Take 200 mg by  mouth every 6 (six) hours as needed for headache (pain).    . Lancets Misc. (ACCU-CHEK FASTCLIX LANCET) KIT Use to check sugar 3 times daily 1 kit 0  . MAGNESIUM PO Take 1 tablet by mouth daily.    . polyvinyl alcohol (ARTIFICIAL TEARS) 1.4 % ophthalmic solution Place 1 drop into both eyes daily as needed for dry eyes.    Marland Kitchen sertraline (ZOLOFT) 100 MG tablet TAKE 2 TABLET BY MOUTH DAILY 180 tablet 3  . UNIFINE PENTIPS 31G X 8 MM MISC USE 4 TIMES DAILY 100 each 5   No current facility-administered medications on  file prior to visit.    Review of Systems Constitutional: Negative for other unusual diaphoresis, sweats, appetite or weight changes HENT: Negative for other worsening hearing loss, ear pain, facial swelling, mouth sores or neck stiffness.   Eyes: Negative for other worsening pain, redness or other visual disturbance.  Respiratory: Negative for other stridor or swelling Cardiovascular: Negative for other palpitations or other chest pain  Gastrointestinal: Negative for worsening diarrhea or loose stools, blood in stool, distention or other pain Genitourinary: Negative for hematuria, flank pain or other change in urine volume.  Musculoskeletal: Negative for myalgias or other joint swelling.  Skin: Negative for other color change, or other wound or worsening drainage.  Neurological: Negative for other syncope or numbness. Hematological: Negative for other adenopathy or swelling Psychiatric/Behavioral: Negative for hallucinations, other worsening agitation, SI, self-injury, or new decreased concentration All other system neg per pt    Objective:   Physical Exam BP (!) 142/86   Pulse 88   Temp 98.7 F (37.1 C) (Oral)   Ht 5' 8" (1.727 m)   Wt 199 lb (90.3 kg)   SpO2 98%   BMI 30.26 kg/m  VS noted,  Constitutional: Pt is oriented to person, place, and time. Appears well-developed and well-nourished, in no significant distress and comfortable Head: Normocephalic and atraumatic  Eyes: Conjunctivae and EOM are normal. Pupils are equal, round, and reactive to light Right Ear: External ear normal without discharge Left Ear: External ear normal without discharge Nose: Nose without discharge or deformity Mouth/Throat: Oropharynx is without other ulcerations and moist  Neck: Normal range of motion. Neck supple. No JVD present. No tracheal deviation present or significant neck LA or mass Cardiovascular: Normal rate, regular rhythm, normal heart sounds and intact distal pulses.     Pulmonary/Chest: WOB normal and breath sounds without rales or wheezing  Abdominal: Soft. Bowel sounds are normal. NT. No HSM  Musculoskeletal: Normal range of motion. Exhibits no edema Lymphadenopathy: Has no other cervical adenopathy.  Neurological: Pt is alert and oriented to person, place, and time. Pt has normal reflexes. No cranial nerve deficit. Motor grossly intact, Gait intact Skin: Skin is warm and dry. No rash noted or new ulcerations Psychiatric:  Has nervous depressed mood and affect. Behavior is normal without agitation No other exam findings    Assessment & Plan:

## 2017-12-18 NOTE — Assessment & Plan Note (Signed)
Likely improved, for f/u lab

## 2017-12-18 NOTE — Assessment & Plan Note (Signed)

## 2017-12-18 NOTE — Assessment & Plan Note (Signed)
?   Control, o/w stable overall by history and exam, recent data reviewed with pt, and pt to continue medical treatment as before,  to f/u any worsening symptoms or concerns

## 2017-12-18 NOTE — Assessment & Plan Note (Signed)
Etiology unclear, for b12 and Vit D

## 2017-12-18 NOTE — Assessment & Plan Note (Signed)
Ok for psychiatry referral 

## 2017-12-19 ENCOUNTER — Telehealth: Payer: Self-pay

## 2017-12-19 LAB — HEPATITIS C ANTIBODY
HEP C AB: NONREACTIVE
SIGNAL TO CUT-OFF: 0 (ref <1.00)

## 2017-12-19 MED FILL — ATORVASTATIN 40 MG TABLET: 40 | 90 days supply | Qty: 90 | Fill #0

## 2017-12-19 NOTE — Telephone Encounter (Signed)
Pt has viewed results via MyChart  

## 2017-12-19 NOTE — Telephone Encounter (Signed)
-----   Message from Biagio Borg, MD sent at 12/18/2017  6:40 PM EDT ----- Left message on MyChart, pt to cont same tx except  The test results show that your current treatment is OK, except the sugar and cholesterol are too high.  Please restart your medications for diabetes, and also start generic lipitor 40 mg per day.  I will send the new prescription, and you should hear from the office today    Marguriete Wootan to please inform pt, I will do rx

## 2017-12-23 ENCOUNTER — Encounter: Payer: Self-pay | Admitting: Internal Medicine

## 2017-12-23 ENCOUNTER — Ambulatory Visit: Payer: 59 | Admitting: Internal Medicine

## 2017-12-23 VITALS — BP 158/84 | HR 95 | Ht 68.0 in | Wt 193.2 lb

## 2017-12-23 DIAGNOSIS — E1140 Type 2 diabetes mellitus with diabetic neuropathy, unspecified: Secondary | ICD-10-CM | POA: Diagnosis not present

## 2017-12-23 DIAGNOSIS — Z794 Long term (current) use of insulin: Secondary | ICD-10-CM | POA: Diagnosis not present

## 2017-12-23 DIAGNOSIS — E785 Hyperlipidemia, unspecified: Secondary | ICD-10-CM

## 2017-12-23 MED ORDER — INSULIN GLARGINE 100 UNIT/ML SOLOSTAR PEN: 60.0000 [IU] | PEN_INJECTOR | Freq: Every day | SUBCUTANEOUS | 2 refills | Status: DC

## 2017-12-23 MED ORDER — GLUCOSE BLOOD VI STRP: ORAL_STRIP | 5 refills | Status: AC

## 2017-12-23 MED FILL — ACCU-CHEK GUIDE STRP: 90 days supply | Qty: 300 | Fill #0

## 2017-12-23 NOTE — Patient Instructions (Signed)
Please start: - Lantus 60 units at bedtime - Humalog: 15 units with a smaller meal 20 units with a larger meal (25 units if you go out to eat or have dessert)  Please come back for a follow-up appointment in 3 months.

## 2017-12-23 NOTE — Progress Notes (Addendum)
Subjective:     Patient ID: Cassandra Morris, female   DOB: [birth year], 67 y.o.   MRN: [id number]  Diabetes    Ms Forni is a pleasant 67 y.o. woman, returning for f/u for DM2, dx 2011, insulin-dependent, uncontrolled, with complications (PN). Last visit 1 year ago!  She was out of insulin between refills (x1-2 months!). She just picked up her insulins.   She lost 16 lbs 2/2 N/V 2/2 cholecystitis  - had cholecystectomy recently.   She will start seeing a psychiatrist.  Reviewed HbA1c levels- greatly increased in the last year: Lab Results  Component Value Date   HGBA1C 11.6 (H) 12/18/2017   HGBA1C 8.2 12/21/2016   HGBA1C 7.6 09/24/2016   HGBA1C 10.8 06/12/2016   HGBA1C 10.3 03/12/2016   HGBA1C 9.9 12/12/2015   HGBA1C 11.1 (H) 09/02/2015   HGBA1C 8.8 (H) 02/24/2015   HGBA1C 8.8 (H) 11/29/2014   HGBA1C 7.4 (H) 07/28/2014   HGBA1C 12.0 (H) 04/20/2014   HGBA1C 8.3 (H) 05/05/2013   HGBA1C 11.9 (H) 10/24/2012   HGBA1C 10.0 (H) 09/26/2010   HGBA1C 8.4 (H) 03/16/2010   She is now off all insulins!  Prev.: - Lantus 50 >> 40 >> 50 >> 60 units after dinner - Humalog: 15 units with a smaller meal 20 units with a larger meal (25 units if you go out to eat or have dessert) Trulicity 4.09 mg weekly >> 1.5 mg >> Nausea >> stopped. She stopped Metformin XR 500 mg 2x a day >> diarrhea >> stopped.  She checks her sugars seldom:  - am:  120-180 >> 171-359 >> 350s - 2h after b'fast: 211-425 >> 95-199, 220 >> n/c  - prelunch:80-223 >> 130-180 >> 169-282 >> n/c - 2h postlunch 194-390 >> 123-216 >> n/c - predinner:  120-150 >> 67 x1, 184-328 >> n/c - 2h post dinner:  88 >> n/c >> 196-168 >> n/c - bedtime: 144-195 >> n/c >> 122, 254 >> n/c Lows: 67 x1 >> 200. Highest: 300s >> 500.  Glucometer: OneTouch Verio  - No chronic kidney disease: Lab Results  Component Value Date   TSH 3.80 12/18/2017   CREATININE 0.86 12/18/2017   - + HL: Lab Results  Component Value Date   CHOL 229  (H) 12/18/2017   HDL 40.90 12/18/2017   LDLCALC 61 07/16/2014   LDLDIRECT 157.0 12/18/2017   TRIG 292.0 (H) 12/18/2017   CHOLHDL 6 12/18/2017  On Lipitor 40 - just started.  - She had an eye exam in 03/2017 - No DR. Dr Katy Fitch. She has a h/o iritis. Had cataract sx.  - no numbness/tingling in feet.  She also has hypertension, obesity. She has severe depression. Pt has severe depression, which she has every summer >> cannot control her sugars well.  She saw a psychiatrist in 11/2013 >> Depakote and Lamictal >> she tapered these off as she felt terrible on these >> She now sees and other psychiatrist >> Zoloft and Xanax.   Review of Systems Constitutional: no weight gain/+ weight loss, + fatigue, no subjective hyperthermia, no subjective hypothermia, + nocturia Eyes: no blurry vision, no xerophthalmia ENT: no sore throat, no nodules palpated in throat, no dysphagia, no odynophagia, no hoarseness Cardiovascular: no CP/+ SOB/no palpitations/no leg swelling Respiratory: no cough/+ SOB/no wheezing Gastrointestinal: + all: N/V/D/acid reflux Musculoskeletal: no muscle aches/no joint aches Skin: no rashes, no hair loss Neurological: no tremors/no numbness/no tingling/no dizziness  I reviewed pt's medications, allergies, PMH, social hx, family hx, and changes were documented  in the history of present illness. Otherwise, unchanged from my initial visit note.  Objective:   Physical Exam BP (!) 158/84   Pulse 95   Ht 5\' 8"  (1.727 m)   Wt 193 lb 3.2 oz (87.6 kg)   SpO2 97%   BMI 29.38 kg/m  Body mass index is 29.38 kg/m. Wt Readings from Last 3 Encounters:  12/23/17 193 lb 3.2 oz (87.6 kg)  12/18/17 199 lb (90.3 kg)  12/21/16 209 lb (94.8 kg)   Constitutional: overweight, in NAD Eyes: unequal pupils L>R - surgical R pupil, EOMI, no exophthalmos ENT: moist mucous membranes, no thyromegaly, no cervical lymphadenopathy Cardiovascular: tachycardia, RR, No MRG Respiratory: CTA  B Gastrointestinal: abdomen soft, NT, ND, BS+ Musculoskeletal: no deformities, strength intact in all 4 Skin: moist, warm, no rashes Neurological: no tremor with outstretched hands, DTR normal in all 4  Assessment:     1. DM2, insulin-dependent, uncontrolled, with complications - PN  2. HL  Plan:     1.  Patient with poorly controlled diabetes, with fluctuating levels of control, depending on the time of the year.  She is usually more depressed during the summer because of the hot weather.  However, in the last year, she did worse, especially in the last few months after she came off her insulin.  For a while she has been increasing Lantus since she did not have Humalog, but then she ran out of Lantus, also.  She was also not checking sugars consistently, but whenever she checked, they were in the 300s. - She just picked up her Lantus and Humalog from the pharmacy and is planning to start depending on the doses recommended at this appointment - We discussed about restarting the insulins per the previous regimen, but only start with half of the Lantus dose for tonight and only start with the Humalog doses for small meals for today and from tomorrow 1 to go back to the previous regimen - last HbA1c was reviewed with the patient: 11.6% (much increased from before) - I advised her to: Patient Instructions  Please start: - Lantus 60 units at bedtime - Humalog: 15 units with a smaller meal 20 units with a larger meal (25 units if you go out to eat or have dessert)  Please come back for a follow-up appointment in 3 months.  - restart checking sugars at different times of the day - check 3x a day, rotating checks - advised for yearly eye exams >> she is UTD - Return to clinic in 3 mo with sugar log   2. HL - Reviewed latest lipid panel from 11/2017, LDL above goal - She was just started on Lipitor by PCP.  Discussed the importance of taking it.  Philemon Kingdom, MD PhD [facility]  Endocrinology

## 2018-02-13 IMAGING — CT CT ABD-PELV W/ CM
1 of 7 series · 3 of 46 positions shown, 4 images · IV contrast (Iodine)
Comparison: None.

CLINICAL DATA: Vomiting, diarrhea

EXAM:
CT ABDOMEN AND PELVIS WITH CONTRAST
TECHNIQUE: Multidetector CT imaging of the abdomen and pelvis was performed
using the standard protocol following bolus administration of
intravenous contrast.
CONTRAST:  100mL [id number] IOPAMIDOL ([id number]) INJECTION 61%

[Series 303: cor · coronal · 0.78mm/px · 3 of 134 slices shown, 4 images]
[im 34/134  soft-tissue]
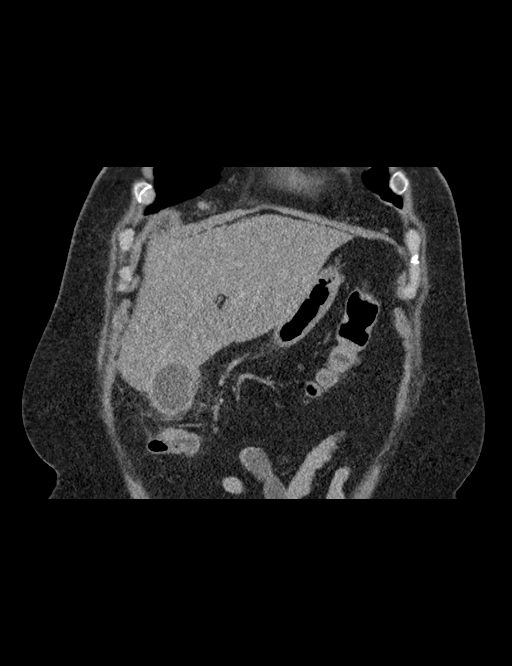
[im 34/134  bone]
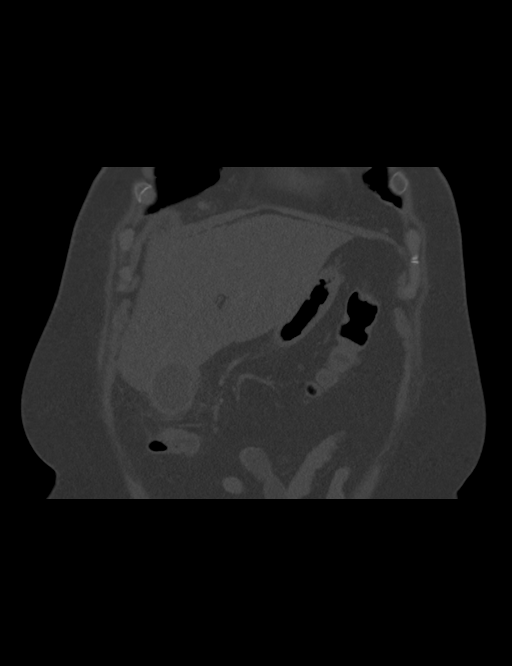
[im 67/134  soft-tissue]
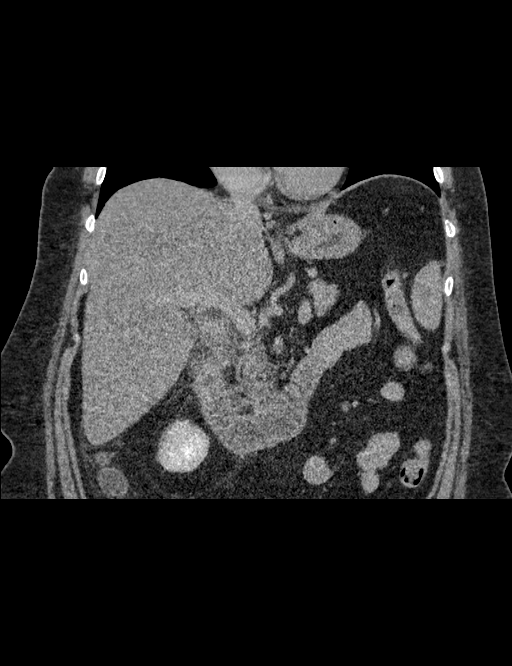
[im 100/134  soft-tissue]
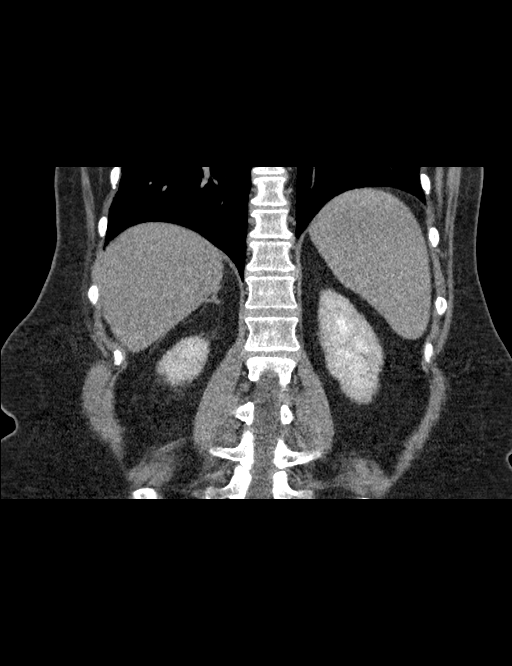

[3 of 46 positions shown; findings below may reference images not displayed]

FINDINGS: Lower chest: Lung bases are clear.

Hepatobiliary: Liver is within normal limits.

Multiple gallstones with gallbladder wall thickening and surrounding
inflammatory changes, worrisome for acute cholecystitis.

No intrahepatic or extrahepatic ductal dilatation.

Pancreas: Within normal limits.

Spleen: Within normal limits.

Adrenals/Urinary Tract: Adrenal glands are within normal limits.

Kidneys are within normal limits.  No hydronephrosis.

Bladder is underdistended but unremarkable.

Stomach/Bowel: Stomach is notable for a tiny hiatal hernia.

No evidence of bowel obstruction.

Normal appendix (series 201/ image 66).

Vascular/Lymphatic: No evidence of abdominal aortic aneurysm.

Atherosclerotic calcifications of the abdominal aorta and branch
vessels.

No suspicious abdominopelvic lymphadenopathy.

Reproductive: Uterus is grossly unremarkable.

Bilateral ovaries are unremarkable.

Other: No abdominopelvic ascites.

Musculoskeletal: Visualized osseous structures are within normal
limits.
IMPRESSION: Cholelithiasis with findings suspicious for acute cholecystitis.

These results were called by telephone at the time of interpretation
on 11/04/2016 at [DATE] to Dr. MICHAEL BO BASKE , who verbally
acknowledged these results.

## 2018-03-21 MED FILL — LANTUS SOLOSTAR 100 UNITS/M: 100 | 60 days supply | Qty: 30 | Fill #1

## 2018-03-21 MED FILL — ACCU-CHEK GUIDE STRP: 90 days supply | Qty: 300 | Fill #1

## 2018-03-21 MED FILL — HUMALOG 100 UNITS/ML KWIKPE: 100 | 17 days supply | Qty: 15 | Fill #1

## 2018-03-21 MED FILL — ATORVASTATIN 40 MG TABLET: 40 | 90 days supply | Qty: 90 | Fill #1

## 2018-03-27 ENCOUNTER — Ambulatory Visit (HOSPITAL_COMMUNITY): Payer: 59 | Admitting: Psychiatry

## 2018-04-18 ENCOUNTER — Telehealth: Payer: 59 | Admitting: Family

## 2018-04-18 DIAGNOSIS — N390 Urinary tract infection, site not specified: Secondary | ICD-10-CM | POA: Diagnosis not present

## 2018-04-18 MED ORDER — CEPHALEXIN 500 MG PO CAPS: 500.0000 mg | ORAL_CAPSULE | Freq: Two times a day (BID) | ORAL | 0 refills | Status: DC

## 2018-04-18 MED FILL — CEPHALEXIN 500 MG CAPSULE: 500 | 7 days supply | Qty: 14 | Fill #0

## 2018-04-18 NOTE — Progress Notes (Signed)

## 2018-04-25 ENCOUNTER — Ambulatory Visit (INDEPENDENT_AMBULATORY_CARE_PROVIDER_SITE_OTHER): Payer: 59 | Admitting: Internal Medicine

## 2018-04-25 ENCOUNTER — Encounter: Payer: Self-pay | Admitting: Internal Medicine

## 2018-04-25 VITALS — BP 140/70 | HR 85 | Ht 68.0 in | Wt 204.8 lb

## 2018-04-25 DIAGNOSIS — E1140 Type 2 diabetes mellitus with diabetic neuropathy, unspecified: Secondary | ICD-10-CM

## 2018-04-25 DIAGNOSIS — E785 Hyperlipidemia, unspecified: Secondary | ICD-10-CM

## 2018-04-25 DIAGNOSIS — Z794 Long term (current) use of insulin: Secondary | ICD-10-CM

## 2018-04-25 LAB — POCT GLYCOSYLATED HEMOGLOBIN (HGB A1C): HEMOGLOBIN A1C: 9.8 % — AB (ref 4.0–5.6)

## 2018-04-25 MED ORDER — INSULIN LISPRO 100 UNIT/ML (KWIKPEN): PEN_INJECTOR | SUBCUTANEOUS | 5 refills | Status: DC

## 2018-04-25 MED ORDER — METFORMIN HCL ER 500 MG PO TB24: 500.0000 mg | ORAL_TABLET | Freq: Two times a day (BID) | ORAL | 5 refills | Status: DC

## 2018-04-25 MED ORDER — INSULIN GLARGINE 100 UNIT/ML SOLOSTAR PEN: 80.0000 [IU] | PEN_INJECTOR | Freq: Every day | SUBCUTANEOUS | 5 refills | Status: DC

## 2018-04-25 NOTE — Progress Notes (Signed)
Subjective:     Patient ID: Cassandra Morris, female   DOB: 1951/03/21, 67 y.o.   MRN: [id number]  Diabetes    [name] is a pleasant 67 y.o. woman, returning for f/u for DM2, dx 2011, insulin-dependent, uncontrolled, with complications (PN). Last visit 4 months ago  Patient's diabetes control is affected by her depression and intermittent compliance with her medication.  She is currently seeing a psychiatrist.   She is feeling much better especially that she recently bought a house near [facility].  At last visit, she ran out of insulin and her sugars significantly increased.  We restarted her previous insulin doses in 11/2017.  Sugar started to improve, but they are still higher than target.  She also frequently forgets her basal insulin.  She did better with this in the last few days.  She had a UTI >> just finished ABx.  Reviewed HbA1c levels: Lab Results  Component Value Date   HGBA1C 11.6 (H) 12/18/2017   HGBA1C 8.2 12/21/2016   HGBA1C 7.6 09/24/2016   HGBA1C 10.8 06/12/2016   HGBA1C 10.3 03/12/2016   HGBA1C 9.9 12/12/2015   HGBA1C 11.1 (H) 09/02/2015   HGBA1C 8.8 (H) 02/24/2015   HGBA1C 8.8 (H) 11/29/2014   HGBA1C 7.4 (H) 07/28/2014   HGBA1C 12.0 (H) 04/20/2014   HGBA1C 8.3 (H) 05/05/2013   HGBA1C 11.9 (H) 10/24/2012   HGBA1C 10.0 (H) 09/26/2010   HGBA1C 8.4 (H) 03/16/2010    She is currently on: - Lantus 60 >> 80 units after dinner - Humalog: 25 units before meals  Trulicity 4.97 mg weekly >> 1.5 mg >> Nausea >> stopped. She stopped Metformin XR 500 mg 2x a day >> diarrhea >> stopped.  She checks her sugars sporadically: - am:  120-180 >> 171-359 >> 350s >> 174-334, 392 (300s when she forgot basal insulin) - 2h after b'fast: 211-425 >> 95-199, 220 >> n/c  - prelunch:80-223 >> 130-180 >> 169-282 >> n/c >> 125, 166-280, 350 - 2h postlunch 194-390 >> 123-216 >> n/c - predinner:  120-150 >> 67 x1, 184-328 >> n/c >> 160-271 - 2h post dinner:  88 >> n/c >> 196-168 >>  n/c - bedtime: 144-195 >> n/c >> 122, 254 >> n/c >> 223 Lows: 67 x1 >> 200 >> 125 Highest: 300s >> 500 >> 392.  Glucometer: OneTouch Verio  -No CKD: Lab Results  Component Value Date   BUN 13 12/18/2017   Lab Results  Component Value Date   CREATININE 0.86 12/18/2017  She is not on ACE inhibitor or ARB.  - + HL: Lab Results  Component Value Date   CHOL 229 (H) 12/18/2017   HDL 40.90 12/18/2017   LDLCALC 61 07/16/2014   LDLDIRECT 157.0 12/18/2017   TRIG 292.0 (H) 12/18/2017   CHOLHDL 6 12/18/2017  She started Lipitor 40 before last visit.  - She had an eye exam in 03/2017: No DR. Dr Katy Fitch. She has a h/o iritis.  She has a history of cataract surgery.  - No numbness/tingling in feet.  She also has HTN, obesity. Pt has severe depression, which she has every summer >> cannot control her sugars well.  She saw a psychiatrist in 11/2013 >> Depakote and Lamictal >> she tapered these off as she felt terrible on these >> She now sees and other psychiatrist >> Zoloft and Xanax.   Review of Systems Constitutional: + Weight gain/no weight loss, no fatigue, no subjective hyperthermia, no subjective hypothermia, + increased urination and nocturia Eyes: no  blurry vision, no xerophthalmia ENT: no sore throat, no nodules palpated in throat, no dysphagia, no odynophagia, no hoarseness Cardiovascular: no CP/no SOB/no palpitations/no leg swelling Respiratory: no cough/no SOB/no wheezing Gastrointestinal: no N/no V/no D/no C/no acid reflux Musculoskeletal: no muscle aches/no joint aches Skin: no rashes, no hair loss Neurological: no tremors/no numbness/no tingling/no dizziness  I reviewed pt's medications, allergies, PMH, social hx, family hx, and changes were documented in the history of present illness. Otherwise, unchanged from my initial visit note.  Past Medical History:  Diagnosis Date  . Depression   . Diabetes mellitus    T2DM  . Hyperlipidemia   . Hypertension   . Obesity  (BMI 30-39.9)    Past Surgical History:  Procedure Laterality Date  . CHOLECYSTECTOMY N/A 11/05/2016   Procedure: LAPAROSCOPIC CHOLECYSTECTOMY;  Surgeon: Rolm Bookbinder, MD;  Location: [facility];  Service: General;  Laterality: N/A;   Social History   Socioeconomic History  . Marital status: Divorced    Spouse name: Not on file  . Number of children: Not on file  . Years of education: Not on file  . Highest education level: Not on file  Occupational History  . Not on file  Social Needs  . Financial resource strain: Not on file  . Food insecurity:    Worry: Not on file    Inability: Not on file  . Transportation needs:    Medical: Not on file    Non-medical: Not on file  Tobacco Use  . Smoking status: Former Smoker    Packs/day: 0.25    Years: 9.00    Pack years: 2.25    Types: Cigarettes    Last attempt to quit: 08/27/1984    Years since quitting: 33.6  . Smokeless tobacco: Never Used  Substance and Sexual Activity  . Alcohol use: Yes    Alcohol/week: 0.0 standard drinks    Comment: rare  . Drug use: No  . Sexual activity: Not on file  Lifestyle  . Physical activity:    Days per week: Not on file    Minutes per session: Not on file  . Stress: Not on file  Relationships  . Social connections:    Talks on phone: Not on file    Gets together: Not on file    Attends religious service: Not on file    Active member of club or organization: Not on file    Attends meetings of clubs or organizations: Not on file    Relationship status: Not on file  . Intimate partner violence:    Fear of current or ex partner: Not on file    Emotionally abused: Not on file    Physically abused: Not on file    Forced sexual activity: Not on file  Other Topics Concern  . Not on file  Social History Narrative   Caffeine use: daily, soda   Regular exercise: joined gym, cut back due to current health issues         Current Outpatient Medications on File Prior to Visit  Medication Sig  Dispense Refill  . ACCU-CHEK FASTCLIX LANCETS MISC Use to check sugar 3 times daily 300 each 5  . acetaminophen (TYLENOL) 325 MG tablet Take 2 tablets (650 mg total) by mouth every 6 (six) hours as needed for fever. 30 tablet 0  . ARIPiprazole (ABILIFY) 5 MG tablet TAKE 1/2 TABLET BY MOUTH ONCE DAILY 15 tablet 0  . atorvastatin (LIPITOR) 40 MG tablet Take 1 tablet (40 mg total)  by mouth daily. 90 tablet 3  . Blood Glucose Monitoring Suppl (ACCU-CHEK GUIDE) w/Device KIT 1 each by Does not apply route daily. 1 kit 0  . calcium carbonate (TUMS EX) 750 MG chewable tablet Chew 1-2 tablets by mouth daily as needed for heartburn (acid indigestion).    . cephALEXin (KEFLEX) 500 MG capsule Take 1 capsule (500 mg total) by mouth 2 (two) times daily. 14 capsule 0  . Cholecalciferol (VITAMIN D3 PO) Take 2 capsules by mouth daily.    . Cyanocobalamin (VITAMIN B-12 SL) Place 1 tablet under the tongue daily.    Marland Kitchen glucose blood (ACCU-CHEK GUIDE) test strip Use as instructed to check sugar 3 times daily 300 each 5  . ibuprofen (ADVIL,MOTRIN) 200 MG tablet Take 200 mg by mouth every 6 (six) hours as needed for headache (pain).    . Insulin Glargine (LANTUS SOLOSTAR) 100 UNIT/ML Solostar Pen Inject 60 Units into the skin daily at 10 pm. 30 mL 2  . insulin lispro (HUMALOG KWIKPEN) 100 UNIT/ML KiwkPen INJECT 15-30 UNITS 3 TIMES DAILY 15 MINUTES BEFORE MEALS 15 mL 2  . Lancets Misc. (ACCU-CHEK FASTCLIX LANCET) KIT Use to check sugar 3 times daily 1 kit 0  . MAGNESIUM PO Take 1 tablet by mouth daily.    . polyvinyl alcohol (ARTIFICIAL TEARS) 1.4 % ophthalmic solution Place 1 drop into both eyes daily as needed for dry eyes.    Marland Kitchen sertraline (ZOLOFT) 100 MG tablet TAKE 2 TABLET BY MOUTH DAILY 180 tablet 3  . UNIFINE PENTIPS 31G X 8 MM MISC USE 4 TIMES DAILY 100 each 5   No current facility-administered medications on file prior to visit.    Allergies  Allergen Reactions  . Trulicity [Dulaglutide] Nausea And Vomiting   . Metformin And Related Diarrhea   Family History  Problem Relation Age of Onset  . Diabetes Mother   . Heart disease Mother   . Allergies Mother   . Asthma Mother   . Deep vein thrombosis Mother     Objective:   Physical Exam BP 140/70   Pulse 85   Ht 5' 8"  (1.727 m)   Wt 204 lb 12.8 oz (92.9 kg)   SpO2 95%   BMI 31.14 kg/m  Body mass index is 31.14 kg/m. Wt Readings from Last 3 Encounters:  04/25/18 204 lb 12.8 oz (92.9 kg)  12/23/17 193 lb 3.2 oz (87.6 kg)  12/18/17 199 lb (90.3 kg)   Constitutional: overweight, in NAD Eyes: unequal pupils L>R - surgical R pupil, EOMI, no exophthalmos ENT: moist mucous membranes, no thyromegaly, no cervical lymphadenopathy Cardiovascular: RRR, No MRG Respiratory: CTA B Gastrointestinal: abdomen soft, NT, ND, BS+ Musculoskeletal: no deformities, strength intact in all 4 Skin: moist, warm, no rashes Neurological: no tremor with outstretched hands, DTR normal in all 4  Assessment:     1. DM2, insulin-dependent, uncontrolled, with complications - PN  2. HL  Plan:     1.  Patient with history of poorly controlled diabetes with fluctuating levels of control depending on the time of the year.  She usually has depression during the summer because of the hot weather.  At last visit, HbA1c was much worse compared to before after she came off her insulins when she ran out.  She was not checking sugars consistently and whenever she was checking, they were in the 300s.  We restarted her basal-bolus insulin regimen at that time. -Latest HbA1c reviewed with the patient: 11.6% -Since last visit, sugars are  better, but she is still struggling with remembering to take the doses of her medications. -At this visit, since the sugars are still in the 200s and occasionally in the 300s, will increase the Humalog slightly and she agrees to retry metformin extended release.  In the meantime, she is feeling better and planning to walk more and especially  with fall approaching, I believe that her sugars will improve before next visit. - I advised her to: Patient Instructions  Please continue: - Lantus 80 units at bedtime  Please increase: - Humalog 25-30 units before meals.  Please add: - Metformin ER 500 mg 2x a day and may increase to 1000 mg 2x a day.  Please come back for a follow-up appointment in 3 to 4 months  - today, HbA1c is 9.8% (better) - continue checking sugars at different times of the day - check 1x a day, rotating checks - advised for yearly eye exams >> she is due for another one - Return to clinic in 3-4 mo with sugar log    2. HL - Reviewed latest lipid panel from 11/2017: LDL much worse Lab Results  Component Value Date   CHOL 229 (H) 12/18/2017   HDL 40.90 12/18/2017   LDLCALC 61 07/16/2014   LDLDIRECT 157.0 12/18/2017   TRIG 292.0 (H) 12/18/2017   CHOLHDL 6 12/18/2017  - Continues on the Lipitor started after the above results returned.  No side effects.  We discussed about the importance of continuing this consistently.  Philemon Kingdom, MD PhD [facility] Endocr[facility]ology

## 2018-04-25 NOTE — Addendum Note (Signed)
Addended by: Drucilla Schmidt on: 04/25/2018 04:32 PM   Modules accepted: Orders

## 2018-04-25 NOTE — Patient Instructions (Addendum)
Please continue: - Lantus 80 units at bedtime  Please increase: - Humalog 25-30 units before meals.  Please add: - Metformin ER 500 mg 2x a day and may increase to 1000 mg 2x a day.  Please come back for a follow-up appointment in 3 to 4 months

## 2018-04-29 MED FILL — LANTUS SOLOSTAR 100 UNITS/M: 100 | 37 days supply | Qty: 30 | Fill #0

## 2018-04-29 MED FILL — HUMALOG 100 UNITS/ML KWIKPE: 100 | 29 days supply | Qty: 27 | Fill #0

## 2018-04-29 MED FILL — METFORMIN HCL ER 500 MG TAB: 500 | 90 days supply | Qty: 180 | Fill #0

## 2018-05-06 ENCOUNTER — Encounter: Payer: Self-pay | Admitting: Internal Medicine

## 2018-05-27 ENCOUNTER — Ambulatory Visit (HOSPITAL_COMMUNITY): Payer: 59 | Admitting: Psychiatry

## 2018-05-27 ENCOUNTER — Encounter (HOSPITAL_COMMUNITY): Payer: Self-pay | Admitting: Psychiatry

## 2018-05-27 VITALS — BP 122/76 | HR 88 | Ht 67.25 in | Wt 199.0 lb

## 2018-05-27 DIAGNOSIS — Z87891 Personal history of nicotine dependence: Secondary | ICD-10-CM

## 2018-05-27 DIAGNOSIS — F330 Major depressive disorder, recurrent, mild: Secondary | ICD-10-CM | POA: Diagnosis not present

## 2018-05-27 DIAGNOSIS — Z62810 Personal history of physical and sexual abuse in childhood: Secondary | ICD-10-CM | POA: Diagnosis not present

## 2018-05-27 DIAGNOSIS — F411 Generalized anxiety disorder: Secondary | ICD-10-CM | POA: Diagnosis not present

## 2018-05-27 MED ORDER — ARIPIPRAZOLE 2 MG PO TABS: 2.0000 mg | ORAL_TABLET | Freq: Every day | ORAL | 1 refills | Status: DC

## 2018-05-27 MED ORDER — DULOXETINE HCL 20 MG PO CPEP: ORAL_CAPSULE | ORAL | 1 refills | Status: DC

## 2018-05-27 MED FILL — ARIPiprazole 2 MG TABS: 2 | 30 days supply | Qty: 30 | Fill #0

## 2018-05-27 MED FILL — DULoxetine HCL 20 MG CPEP: 20 | 30 days supply | Qty: 60 | Fill #0
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
## 2018-07-01 ENCOUNTER — Ambulatory Visit (INDEPENDENT_AMBULATORY_CARE_PROVIDER_SITE_OTHER): Payer: 59 | Admitting: Psychiatry

## 2018-07-01 DIAGNOSIS — F330 Major depressive disorder, recurrent, mild: Secondary | ICD-10-CM

## 2018-07-01 DIAGNOSIS — F411 Generalized anxiety disorder: Secondary | ICD-10-CM

## 2018-07-01 MED ORDER — ARIPIPRAZOLE 2 MG PO TABS: 2.0000 mg | ORAL_TABLET | Freq: Every day | ORAL | 0 refills | Status: DC

## 2018-07-01 MED ORDER — DULOXETINE HCL 20 MG PO CPEP: 20.0000 mg | ORAL_CAPSULE | Freq: Two times a day (BID) | ORAL | 0 refills | Status: DC

## 2018-07-01 MED FILL — DULoxetine HCL 20 MG CPEP: 20 | 90 days supply | Qty: 180 | Fill #0

## 2018-07-01 MED FILL — ARIPiprazole 2 MG TABS: 2 | 90 days supply | Qty: 90 | Fill #0

## 2018-07-01 NOTE — Progress Notes (Signed)
[place] [facility] MD/PA/NP OP Progress Note  07/01/2018 4:03 PM Cassandra Morris  MRN:  [id number]  Chief Complaint: I am feeling better.  I like Cymbalta.  HPI: Cassandra Morris is 67 year old Caucasian employed female who was seen first time 4 weeks ago.  She has a history of depression and anxiety.  She was experiencing severe depression, fatigue, lack of energy and lack of motivation to do things.  We started her on Abilify which she has taken in the past with good response.  We added Cymbalta and she has seen a huge improvement with the Cymbalta.  She was able to clean her house, clean her dirty dishes and started crochet which she has not done in the past.  Her energy level is improved.  Sometimes she does not take Cymbalta twice a day as she forgets.  Since taking the Cymbalta she has no major panic attack.  She has no tremors, shakes or any EPS.  She lives by herself with HER-2 dogs.  She is excited about going to [place] this weekend.  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  Patient denies drinking or using any illegal substances.  She denies any feeling of hopelessness or worthlessness.  She wants to continue her current psychiatric medication.  She is checking her blood sugar and in coming weeks she will had a blood work.  Visit Diagnosis:    ICD-10-CM   1. MDD (major depressive disorder), recurrent episode, mild (HCC) F33.0 DULoxetine (CYMBALTA) 20 MG capsule    ARIPiprazole (ABILIFY) 2 MG tablet  2. GAD (generalized anxiety disorder) F41.1 DULoxetine (CYMBALTA) 20 MG capsule    ARIPiprazole (ABILIFY) 2 MG tablet    Past Psychiatric History: Viewed. Patient had a history of depression and she took medication in her 70s.  She took Lexapro, Effexor, Depakote and Lamictal.  She also took Zoloft, Xanax and Abilify but she stopped these medication when she switch provider.  She denies any history of psychiatric inpatient treatment or any suicidal attempt.  Past Medical History:  Past  Medical History:  Diagnosis Date  . Depression   . Diabetes mellitus    T2DM  . Hyperlipidemia   . Hypertension   . Obesity (BMI 30-39.9)     Past Surgical History:  Procedure Laterality Date  . CHOLECYSTECTOMY N/A 11/05/2016   Procedure: LAPAROSCOPIC CHOLECYSTECTOMY;  Surgeon: Rolm Bookbinder, MD;  Location: [facility];  Service: General;  Laterality: N/A;    Family Psychiatric History: Reviewed.  Family History:  Family History  Problem Relation Age of Onset  . Diabetes Mother   . Heart disease Mother   . Allergies Mother   . Asthma Mother   . Deep vein thrombosis Mother   . Dementia Mother   . Alcohol abuse Father   . Anxiety disorder Father   . Depression Father   . Depression Brother   . ADD / ADHD Other     Social History:  Social History   Socioeconomic History  . Marital status: Divorced    Spouse name: Not on file  . Number of children: Not on file  . Years of education: Not on file  . Highest education level: Not on file  Occupational History  . Not on file  Social Needs  . Financial resource strain: Not on file  . Food insecurity:    Worry: Not on file    Inability: Not on file  . Transportation needs:    Medical: Not on file  Non-medical: Not on file  Tobacco Use  . Smoking status: Former Smoker    Packs/day: 0.25    Years: 9.00    Pack years: 2.25    Types: Cigarettes    Last attempt to quit: 08/27/1984    Years since quitting: 33.8  . Smokeless tobacco: Never Used  Substance and Sexual Activity  . Alcohol use: Yes    Alcohol/week: 0.0 standard drinks    Comment: rare  . Drug use: No    Comment: Uses CBD oil daily.   Marland Kitchen Sexual activity: Not Currently  Lifestyle  . Physical activity:    Days per week: Not on file    Minutes per session: Not on file  . Stress: Not on file  Relationships  . Social connections:    Talks on phone: Not on file    Gets together: Not on file    Attends religious service: Not on file    Active member of  club or organization: Not on file    Attends meetings of clubs or organizations: Not on file    Relationship status: Not on file  Other Topics Concern  . Not on file  Social History Narrative   Caffeine use: daily, soda   Regular exercise: joined gym, cut back due to current health issues          Allergies:  Allergies  Allergen Reactions  . Trulicity [Dulaglutide] Nausea And Vomiting  . Metformin And Related Diarrhea    Metabolic Disorder Labs: Lab Results  Component Value Date   HGBA1C 9.8 (A) 04/25/2018   No results found for: PROLACTIN Lab Results  Component Value Date   CHOL 229 (H) 12/18/2017   TRIG 292.0 (H) 12/18/2017   HDL 40.90 12/18/2017   CHOLHDL 6 12/18/2017   VLDL 58.4 (H) 12/18/2017   LDLCALC 61 07/16/2014   LDLCALC 133 (H) 11/29/2008   Lab Results  Component Value Date   TSH 3.80 12/18/2017   TSH 2.26 09/02/2015    Therapeutic Level Labs: No results found for: LITHIUM Lab Results  Component Value Date   VALPROATE 40.0 (L) 10/09/2013   No components found for:  CBMZ  Current Medications: Current Outpatient Medications  Medication Sig Dispense Refill  . ACCU-CHEK FASTCLIX LANCETS MISC Use to check sugar 3 times daily 300 each 5  . acetaminophen (TYLENOL) 325 MG tablet Take 2 tablets (650 mg total) by mouth every 6 (six) hours as needed for fever. 30 tablet 0  . ARIPiprazole (ABILIFY) 2 MG tablet Take 1 tablet (2 mg total) by mouth daily. 30 tablet 1  . atorvastatin (LIPITOR) 40 MG tablet Take 1 tablet (40 mg total) by mouth daily. (Patient not taking: Reported on 05/27/2018) 90 tablet 3  . Blood Glucose Monitoring Suppl (ACCU-CHEK GUIDE) w/Device KIT 1 each by Does not apply route daily. 1 kit 0  . calcium carbonate (TUMS EX) 750 MG chewable tablet Chew 1-2 tablets by mouth daily as needed for heartburn (acid indigestion).    . Cholecalciferol (VITAMIN D3 PO) Take 2 capsules by mouth daily.    . Cyanocobalamin (VITAMIN B-12 SL) Place 1 tablet  under the tongue daily.    . DULoxetine (CYMBALTA) 20 MG capsule Take one capsule daily for 1 week and than twice daily 60 capsule 1  . glucose blood (ACCU-CHEK GUIDE) test strip Use as instructed to check sugar 3 times daily 300 each 5  . ibuprofen (ADVIL,MOTRIN) 200 MG tablet Take 200 mg by mouth every 6 (six) hours  as needed for headache (pain).    . Insulin Glargine (LANTUS SOLOSTAR) 100 UNIT/ML Solostar Pen Inject 80 Units into the skin daily at 10 pm. 30 mL 5  . insulin lispro (HUMALOG KWIKPEN) 100 UNIT/ML KiwkPen INJECT 25-30 UNITS 3 TIMES DAILY 15 MINUTES BEFORE MEALS 30 mL 5  . Lancets Misc. (ACCU-CHEK FASTCLIX LANCET) KIT Use to check sugar 3 times daily 1 kit 0  . MAGNESIUM PO Take 1 tablet by mouth daily.    . metFORMIN (GLUCOPHAGE-XR) 500 MG 24 hr tablet Take 1 tablet (500 mg total) by mouth 2 (two) times daily. (Patient not taking: Reported on 05/27/2018) 180 tablet 5  . polyvinyl alcohol (ARTIFICIAL TEARS) 1.4 % ophthalmic solution Place 1 drop into both eyes daily as needed for dry eyes.    Marland Kitchen UNIFINE PENTIPS 31G X 8 MM MISC USE 4 TIMES DAILY 100 each 5   No current facility-administered medications for this visit.      Musculoskeletal: Strength & Muscle Tone: within normal limits Gait & Station: normal Patient leans: N/A  Psychiatric Specialty Exam: ROS  Blood pressure 128/74, height 5' 8"  (1.727 m), weight 204 lb (92.5 kg).There is no height or weight on file to calculate BMI.  General Appearance: Casual  Eye Contact:  Good  Speech:  Clear and Coherent  Volume:  Normal  Mood:  Euthymic  Affect:  Congruent  Thought Process:  Goal Directed  Orientation:  Full (Time, Place, and Person)  Thought Content: Logical   Suicidal Thoughts:  No  Homicidal Thoughts:  No  Memory:  Immediate;   Good Recent;   Good Remote;   Good  Judgement:  Good  Insight:  Good  Psychomotor Activity:  Normal  Concentration:  Concentration: Good and Attention Span: Good  Recall:  Good   Fund of Knowledge: Good  Language: Good  Akathisia:  No  Handed:  Right  AIMS (if indicated): not done  Assets:  Communication Skills Desire for Improvement Housing Resilience Talents/Skills Transportation  ADL's:  Intact  Cognition: WNL  Sleep:  Improved   Screenings: PHQ2-9     Office Visit from 12/18/2017 in [facility] Office Visit from 03/02/2015 in [facility] Patient Outreach from 01/06/2015 in [facility]  PHQ-2 Total Score  1  0  1       Assessment and Plan: Major depressive disorder, recurrent.  Generalized anxiety disorder.  I encouraged to continue Cymbalta 20 mg twice a day since it is helping her anxiety and depression.  Discussed risk of noncompliance with medication.  Continue Abilify 2 mg daily.  She is seeing therapist through [facility].  I encouraged to continue that.  Recommended to call us back if she has any question or any concern.  Follow-up in 3 months.  Discussed safety concerns at any time having active suicidal thoughts or homicidal thought and she need to call 911 or go to local emergency room.     Kathlee Nations, MD 07/01/2018, 4:03 PM

## 2018-07-02 ENCOUNTER — Telehealth: Payer: 59 | Admitting: Family

## 2018-07-02 DIAGNOSIS — R399 Unspecified symptoms and signs involving the genitourinary system: Secondary | ICD-10-CM

## 2018-07-02 MED ORDER — CEPHALEXIN 500 MG PO CAPS: 500.0000 mg | ORAL_CAPSULE | Freq: Two times a day (BID) | ORAL | 0 refills | Status: DC

## 2018-07-02 MED FILL — CEPHALEXIN 500 MG CAPSULE: 500 | 7 days supply | Qty: 14 | Fill #0

## 2018-07-02 NOTE — Progress Notes (Signed)

## 2018-08-25 ENCOUNTER — Ambulatory Visit (INDEPENDENT_AMBULATORY_CARE_PROVIDER_SITE_OTHER): Payer: 59 | Admitting: Internal Medicine

## 2018-08-25 ENCOUNTER — Encounter: Payer: Self-pay | Admitting: Internal Medicine

## 2018-08-25 VITALS — BP 138/80 | HR 86 | Ht 68.0 in | Wt 205.0 lb

## 2018-08-25 DIAGNOSIS — Z794 Long term (current) use of insulin: Secondary | ICD-10-CM | POA: Diagnosis not present

## 2018-08-25 DIAGNOSIS — E785 Hyperlipidemia, unspecified: Secondary | ICD-10-CM

## 2018-08-25 DIAGNOSIS — E1140 Type 2 diabetes mellitus with diabetic neuropathy, unspecified: Secondary | ICD-10-CM | POA: Diagnosis not present

## 2018-08-25 LAB — POCT GLYCOSYLATED HEMOGLOBIN (HGB A1C): HEMOGLOBIN A1C: 10.5 % — AB (ref 4.0–5.6)

## 2018-08-25 MED ORDER — INSULIN PEN NEEDLE 32G X 4 MM MISC: 3 refills | Status: AC

## 2018-08-25 MED ORDER — INSULIN DEGLUDEC 200 UNIT/ML ~~LOC~~ SOPN: 80.0000 [IU] | PEN_INJECTOR | Freq: Every day | SUBCUTANEOUS | 5 refills | Status: DC

## 2018-08-25 MED ORDER — INSULIN LISPRO 200 UNIT/ML ~~LOC~~ SOPN: 25.0000 [IU] | PEN_INJECTOR | Freq: Three times a day (TID) | SUBCUTANEOUS | 3 refills | Status: DC

## 2018-08-25 NOTE — Patient Instructions (Addendum)
Please change to: - Tresiba U200 80 units at bedtime - Humalog U200 25-30 units before meals.  PLEASE TAKE THE INSULIN BEFORE EACH MEAL.  STOP REGULAR SODAS.  Please come back for a follow-up appointment in 3 months.

## 2018-08-25 NOTE — Progress Notes (Signed)
Subjective:     Patient ID: Cassandra Morris, female   DOB: [birth year], 67 y.o.   MRN: [id number]  Diabetes    Cassandra Morris is a pleasant 67 y.o. woman, returning for f/u for DM2, dx 2011, insulin-dependent, uncontrolled, with complications (PN). Last visit 4 months ago.  Patient's diabetes control is affected by her depression and intermittent compliance with her medication.  She is currently seeing a psychiatrist.   She felt better at last visit, especially after she bought a house near [facility].   She has a new psychiatrist >> change in meds >> more confused.  Sugars are worse.  However, she is not taking them consistently and is still forgetting a lot of insulin doses.  He is planning to start blue sky diet pgm.  This is covered by her insurance.  Reviewed HbA1c levels: Lab Results  Component Value Date   HGBA1C 9.8 (A) 04/25/2018   HGBA1C 11.6 (H) 12/18/2017   HGBA1C 8.2 12/21/2016   HGBA1C 7.6 09/24/2016   HGBA1C 10.8 06/12/2016   HGBA1C 10.3 03/12/2016   HGBA1C 9.9 12/12/2015   HGBA1C 11.1 (H) 09/02/2015   HGBA1C 8.8 (H) 02/24/2015   HGBA1C 8.8 (H) 11/29/2014   HGBA1C 7.4 (H) 07/28/2014   HGBA1C 12.0 (H) 04/20/2014   HGBA1C 8.3 (H) 05/05/2013   HGBA1C 11.9 (H) 10/24/2012   HGBA1C 10.0 (H) 09/26/2010    She is currently on:  -  -started back 03/2018 >> diarrhea - Lantus 60 >> 80 units after dinner >> occasionally taking it in am to make sure she gets it in, but she still forgets many doses - Humalog: 25 >> 25-30 units before meals  Trulicity 6.94 mg weekly >> 1.5 mg >> Nausea >> stopped. She stopped Metformin XR 500 mg 2x a day >> diarrhea >> stopped.  She checks her sugars sporadically: - am: 171-359 >> 350s >> 174-334, 392 >> 240-350 - 2h after b'fast: 211-425 >> 95-199, 220 >> n/c  - prelunch:169-282 >> n/c >> 125, 166-280, 350 >> 160-280 - 2h postlunch 194-390 >> 123-216 >> n/c - predinner:  67 x1, 184-328 >> n/c >> 160-271 >> n/c - 2h post dinner:  88 >> n/c >>  196-168 >> n/c - bedtime: 122, 254 >> n/c >> 223 >> n/c Lows: 67 x1 >> 200 >> 125 >> 70 (took too much insulin) Highest: 300s >> 500 >> 392 >> 425.  Glucometer: OneTouch Verio  -No CKD: Lab Results  Component Value Date   BUN 13 12/18/2017   Lab Results  Component Value Date   CREATININE 0.86 12/18/2017  She is not on an ACE inhibitor/ARB.  -+ HL: Lab Results  Component Value Date   CHOL 229 (H) 12/18/2017   HDL 40.90 12/18/2017   LDLCALC 61 07/16/2014   LDLDIRECT 157.0 12/18/2017   TRIG 292.0 (H) 12/18/2017   CHOLHDL 6 12/18/2017  On Lipitor 40.  - She had an eye exam in 03/2017: No DR. Dr Katy Fitch. She has a h/o iritis.  She also has a history of cataract surgery.  -No numbness/tingling in feet.  She also has HTN. Pt has severe depression, which she has every summer >> cannot control her sugars well.  She saw a psychiatrist in 11/2013 >> Depakote and Lamictal >> she tapered these off as she felt terrible on these >> She now sees and other psychiatrist >> Zoloft and Xanax.  However, she was switched from Zoloft to Cymbalta >> more confused.  Review of Systems Constitutional: no  weight gain/no weight loss, + fatigue, no subjective hyperthermia, no subjective hypothermia, + nocturia Eyes: no blurry vision, no xerophthalmia ENT: no sore throat, no nodules palpated in neck, no dysphagia, no odynophagia, no hoarseness Cardiovascular: no CP/no SOB/no palpitations/no leg swelling Respiratory: no cough/no SOB/no wheezing Gastrointestinal: no N/no V/no D/no C/no acid reflux Musculoskeletal: no muscle aches/no joint aches Skin: no rashes, no hair loss Neurological: no tremors/no numbness/no tingling/no dizziness  I reviewed pt's medications, allergies, PMH, social hx, family hx, and changes were documented in the history of present illness. Otherwise, unchanged from my initial visit note.  Past Medical History:  Diagnosis Date  . Depression   . Diabetes mellitus    T2DM  .  Hyperlipidemia   . Hypertension   . Obesity (BMI 30-39.9)    Past Surgical History:  Procedure Laterality Date  . CHOLECYSTECTOMY N/A 11/05/2016   Procedure: LAPAROSCOPIC CHOLECYSTECTOMY;  Surgeon: Rolm Bookbinder, MD;  Location: [facility] OR;  Service: General;  Laterality: N/A;   Social History   Socioeconomic History  . Marital status: Divorced    Spouse name: Not on file  . Number of children: Not on file  . Years of education: Not on file  . Highest education level: Not on file  Occupational History  . Not on file  Social Needs  . Financial resource strain: Not on file  . Food insecurity:    Worry: Not on file    Inability: Not on file  . Transportation needs:    Medical: Not on file    Non-medical: Not on file  Tobacco Use  . Smoking status: Former Smoker    Packs/day: 0.25    Years: 9.00    Pack years: 2.25    Types: Cigarettes    Last attempt to quit: 08/27/1984    Years since quitting: 34.0  . Smokeless tobacco: Never Used  Substance and Sexual Activity  . Alcohol use: Yes    Alcohol/week: 0.0 standard drinks    Comment: rare  . Drug use: No    Comment: Uses CBD oil daily.   Marland Kitchen Sexual activity: Not Currently  Lifestyle  . Physical activity:    Days per week: Not on file    Minutes per session: Not on file  . Stress: Not on file  Relationships  . Social connections:    Talks on phone: Not on file    Gets together: Not on file    Attends religious service: Not on file    Active member of club or organization: Not on file    Attends meetings of clubs or organizations: Not on file    Relationship status: Not on file  . Intimate partner violence:    Fear of current or ex partner: Not on file    Emotionally abused: Not on file    Physically abused: Not on file    Forced sexual activity: Not on file  Other Topics Concern  . Not on file  Social History Narrative   Caffeine use: daily, soda   Regular exercise: joined gym, cut back due to current health issues          Current Outpatient Medications on File Prior to Visit  Medication Sig Dispense Refill  . ACCU-CHEK FASTCLIX LANCETS MISC Use to check sugar 3 times daily 300 each 5  . acetaminophen (TYLENOL) 325 MG tablet Take 2 tablets (650 mg total) by mouth every 6 (six) hours as needed for fever. 30 tablet 0  . ARIPiprazole (ABILIFY) 2 MG  tablet Take 1 tablet (2 mg total) by mouth daily. 90 tablet 0  . atorvastatin (LIPITOR) 40 MG tablet Take 1 tablet (40 mg total) by mouth daily. 90 tablet 3  . Blood Glucose Monitoring Suppl (ACCU-CHEK GUIDE) w/Device KIT 1 each by Does not apply route daily. 1 kit 0  . calcium carbonate (TUMS EX) 750 MG chewable tablet Chew 1-2 tablets by mouth daily as needed for heartburn (acid indigestion).    . Cholecalciferol (VITAMIN D3 PO) Take 2 capsules by mouth daily.    . Cyanocobalamin (VITAMIN B-12 SL) Place 1 tablet under the tongue daily.    . DULoxetine (CYMBALTA) 20 MG capsule Take 1 capsule (20 mg total) by mouth 2 (two) times daily. 180 capsule 0  . glucose blood (ACCU-CHEK GUIDE) test strip Use as instructed to check sugar 3 times daily 300 each 5  . ibuprofen (ADVIL,MOTRIN) 200 MG tablet Take 200 mg by mouth every 6 (six) hours as needed for headache (pain).    . Insulin Glargine (LANTUS SOLOSTAR) 100 UNIT/ML Solostar Pen Inject 80 Units into the skin daily at 10 pm. 30 mL 5  . insulin lispro (HUMALOG KWIKPEN) 100 UNIT/ML KiwkPen INJECT 25-30 UNITS 3 TIMES DAILY 15 MINUTES BEFORE MEALS 30 mL 5  . Lancets Misc. (ACCU-CHEK FASTCLIX LANCET) KIT Use to check sugar 3 times daily 1 kit 0  . MAGNESIUM PO Take 1 tablet by mouth daily.    . metFORMIN (GLUCOPHAGE-XR) 500 MG 24 hr tablet Take 1 tablet (500 mg total) by mouth 2 (two) times daily. 180 tablet 5  . polyvinyl alcohol (ARTIFICIAL TEARS) 1.4 % ophthalmic solution Place 1 drop into both eyes daily as needed for dry eyes.    Marland Kitchen UNIFINE PENTIPS 31G X 8 MM MISC USE 4 TIMES DAILY 100 each 5   No current  facility-administered medications on file prior to visit.    Allergies  Allergen Reactions  . Trulicity [Dulaglutide] Nausea And Vomiting  . Metformin And Related Diarrhea   Family History  Problem Relation Age of Onset  . Diabetes Mother   . Heart disease Mother   . Allergies Mother   . Asthma Mother   . Deep vein thrombosis Mother   . Dementia Mother   . Alcohol abuse Father   . Anxiety disorder Father   . Depression Father   . Depression Brother   . ADD / ADHD Other     Objective:   Physical Exam BP 138/80   Pulse 86   Ht 5' 8"  (1.727 m) Comment: measured  Wt 205 lb (93 kg)   SpO2 96%   BMI 31.17 kg/m  Body mass index is 31.17 kg/m. Wt Readings from Last 3 Encounters:  08/25/18 205 lb (93 kg)  04/25/18 204 lb 12.8 oz (92.9 kg)  12/23/17 193 lb 3.2 oz (87.6 kg)   Constitutional: overweight, in NAD Eyes: unequal pupils L>R - surgical R pupil, EOMI, no exophthalmos ENT: moist mucous membranes, no thyromegaly, no cervical lymphadenopathy Cardiovascular: RRR, No MRG Respiratory: CTA B Gastrointestinal: abdomen soft, NT, ND, BS+ Musculoskeletal: no deformities, strength intact in all 4 Skin: moist, warm, no rashes Neurological: no tremor with outstretched hands, DTR normal in all 4  Assessment:     1. DM2, insulin-dependent, uncontrolled, with complications - PN  2. HL  Plan:     1.  Patient with history of poorly controlled diabetes with fluctuating levels of control depending on the time of the year.  She usually has depression during  the summer because of the hot weather.  Her HbA1c increased to 11.6% during the summer and dropped to 9.8% at last visit.  At that time sugars were still in the 200s and occasionally in the 300s as she was still struggling remembering to take the doses of her medicines.  We increase the Humalog slightly and she agreed to retry metformin extended release.  She was planning to walk more. -At this visit, she is actually doing worse,  still forgetting a lot of doses of insulin.  She tells me that she goes to work and sleeps and not do much of anything else.  She is still depressed and feels more confused after the change in her psychotropic medicines by her new psychiatrist.  We discussed that it is paramount to be on a good regimen and control her depression, otherwise, I do not see how we can control her diabetes.  She is planning to start a diet program and this may help, but we absolutely need her psychiatric situation stabilized.  We discussed that I cannot help her if she is not taking insulin doses or not following the recommended regimen. -For now, I will switch her to [place] U200 -which is longer acting and she can take at different times of the day, and Humalog U200. -It would also be paramount to stop regular sodas.  She is now drinking regular Coca-Cola. - I advised her to: Patient Instructions  Please change to: - Tresiba U200 80 units at bedtime - Humalog U200 25-30 units before meals.  PLEASE TAKE THE INSULIN BEFORE EACH MEAL.  STOP REGULAR SODAS.  Please come back for a follow-up appointment in 3 months.  - today, HbA1c is 10.5% (higher). -Start checking sugars at different times of the day - check 3x a day, rotating checks - advised for yearly eye exams >> she is not UTD - Return to clinic in 3 mo with sugar log    2. HL - Reviewed latest lipid panel from 11/2017: LDL much worse after stopping statin.  Since then, Lipitor 40 was restarted. Lab Results  Component Value Date   CHOL 229 (H) 12/18/2017   HDL 40.90 12/18/2017   LDLCALC 61 07/16/2014   LDLDIRECT 157.0 12/18/2017   TRIG 292.0 (H) 12/18/2017   CHOLHDL 6 12/18/2017  - Continues the statin without side effects.  Philemon Kingdom, MD PhD [facility]

## 2018-08-26 MED FILL — TRESIBA FLEXTOUCH 200 UNITS: 200 | 45 days supply | Qty: 18 | Fill #0

## 2018-08-26 MED FILL — HUMALOG 200 UNITS/ML KWIKPE: 200 | 28 days supply | Qty: 12 | Fill #0

## 2018-08-26 MED FILL — UNIFINE PENTIPS 32GX5/32: 32G X 4 MM | 75 days supply | Qty: 300 | Fill #0

## 2018-09-03 DIAGNOSIS — R635 Abnormal weight gain: Secondary | ICD-10-CM | POA: Diagnosis not present

## 2018-09-03 DIAGNOSIS — E119 Type 2 diabetes mellitus without complications: Secondary | ICD-10-CM | POA: Diagnosis not present

## 2018-09-03 DIAGNOSIS — R5383 Other fatigue: Secondary | ICD-10-CM | POA: Diagnosis not present

## 2018-09-03 DIAGNOSIS — E782 Mixed hyperlipidemia: Secondary | ICD-10-CM | POA: Diagnosis not present

## 2018-09-08 DIAGNOSIS — Z1331 Encounter for screening for depression: Secondary | ICD-10-CM | POA: Diagnosis not present

## 2018-09-08 DIAGNOSIS — E782 Mixed hyperlipidemia: Secondary | ICD-10-CM | POA: Diagnosis not present

## 2018-09-08 DIAGNOSIS — Z6831 Body mass index (BMI) 31.0-31.9, adult: Secondary | ICD-10-CM | POA: Diagnosis not present

## 2018-09-08 DIAGNOSIS — F3289 Other specified depressive episodes: Secondary | ICD-10-CM | POA: Diagnosis not present

## 2018-09-08 DIAGNOSIS — R5383 Other fatigue: Secondary | ICD-10-CM | POA: Diagnosis not present

## 2018-09-08 DIAGNOSIS — Z1339 Encounter for screening examination for other mental health and behavioral disorders: Secondary | ICD-10-CM | POA: Diagnosis not present

## 2018-09-08 DIAGNOSIS — M2550 Pain in unspecified joint: Secondary | ICD-10-CM | POA: Diagnosis not present

## 2018-09-08 DIAGNOSIS — E119 Type 2 diabetes mellitus without complications: Secondary | ICD-10-CM | POA: Diagnosis not present

## 2018-09-08 DIAGNOSIS — Z794 Long term (current) use of insulin: Secondary | ICD-10-CM | POA: Diagnosis not present

## 2018-09-09 DIAGNOSIS — H35373 Puckering of macula, bilateral: Secondary | ICD-10-CM | POA: Diagnosis not present

## 2018-09-09 DIAGNOSIS — E119 Type 2 diabetes mellitus without complications: Secondary | ICD-10-CM | POA: Diagnosis not present

## 2018-09-09 DIAGNOSIS — Z961 Presence of intraocular lens: Secondary | ICD-10-CM | POA: Diagnosis not present

## 2018-09-09 DIAGNOSIS — D3132 Benign neoplasm of left choroid: Secondary | ICD-10-CM | POA: Diagnosis not present

## 2018-09-09 LAB — HM DIABETES EYE EXAM

## 2018-09-12 ENCOUNTER — Ambulatory Visit: Payer: 59 | Admitting: Internal Medicine

## 2018-09-12 ENCOUNTER — Encounter: Payer: Self-pay | Admitting: Internal Medicine

## 2018-09-12 VITALS — BP 124/76 | HR 86 | Temp 98.0°F | Ht 68.0 in | Wt 208.0 lb

## 2018-09-12 DIAGNOSIS — R634 Abnormal weight loss: Secondary | ICD-10-CM

## 2018-09-12 DIAGNOSIS — Z794 Long term (current) use of insulin: Secondary | ICD-10-CM | POA: Diagnosis not present

## 2018-09-12 DIAGNOSIS — Z0000 Encounter for general adult medical examination without abnormal findings: Secondary | ICD-10-CM | POA: Diagnosis not present

## 2018-09-12 DIAGNOSIS — E785 Hyperlipidemia, unspecified: Secondary | ICD-10-CM | POA: Diagnosis not present

## 2018-09-12 DIAGNOSIS — E1140 Type 2 diabetes mellitus with diabetic neuropathy, unspecified: Secondary | ICD-10-CM | POA: Diagnosis not present

## 2018-09-12 NOTE — Patient Instructions (Signed)
Please continue all other medications as before, and refills have been done if requested.  Please have the pharmacy call with any other refills you may need.  Please continue your efforts at being more active, low cholesterol diet, and weight control.  You are otherwise up to date with prevention measures today.  Please keep your appointments with your specialists as you may have planned  Please return in 1 year for your yearly visit, or sooner if needed, with Lab testing done 3-5 days before  

## 2018-09-12 NOTE — Assessment & Plan Note (Signed)
D/w pt import of taking the statin but "I've heard to many bad things."  Not clear if she will take, to follow lower chol diet

## 2018-09-12 NOTE — Progress Notes (Signed)
Subjective:    Patient ID: Cassandra Morris, female    DOB: 1951-06-27, 68 y.o.   MRN: [id number]  HPI  Here for wellness and f/u;  Overall doing ok;  Pt denies Chest pain, worsening SOB, DOE, wheezing, orthopnea, PND, worsening LE edema, palpitations, dizziness or syncope.  Pt denies neurological change such as new headache, facial or extremity weakness.  Pt denies polydipsia, polyuria, or low sugar symptoms. Pt states overall good compliance with treatment and medications, good tolerability, and has been trying to follow appropriate diet.  Pt denies worsening depressive symptoms, suicidal ideation or panic. No fever, night sweats, wt loss, loss of appetite, or other constitutional symptoms.  Pt states good ability with ADL's, has low fall risk, home safety reviewed and adequate, no other significant changes in hearing or vision, and active with exercise.  Asks for ECG today to present to her wt loss clinic to show she is ok for wt loss efforts.  Not taking the lipitor, but has been doing much better with taking her second shot of insulin most days, sees endo for this. Also sees Dr Adele Schilder for psychiatric, stable recently per pt.   Also brings labs today from wt loss clinic including a1c 11.1, and LDL 150.  Wt Readings from Last 3 Encounters:  09/12/18 208 lb (94.3 kg)  08/25/18 205 lb (93 kg)  04/25/18 204 lb 12.8 oz (92.9 kg)   Past Medical History:  Diagnosis Date  . Depression   . Diabetes mellitus    T2DM  . Hyperlipidemia   . Hypertension   . Obesity (BMI 30-39.9)    Past Surgical History:  Procedure Laterality Date  . CHOLECYSTECTOMY N/A 11/05/2016   Procedure: LAPAROSCOPIC CHOLECYSTECTOMY;  Surgeon: Rolm Bookbinder, MD;  Location: [facility];  Service: General;  Laterality: N/A;    reports that she quit smoking about 34 years ago. Her smoking use included cigarettes. She has a 2.25 pack-year smoking history. She has never used smokeless tobacco. She reports current alcohol use. She  reports that she does not use drugs. family history includes ADD / ADHD in an other family member; Alcohol abuse in her father; Allergies in her mother; Anxiety disorder in her father; Asthma in her mother; Deep vein thrombosis in her mother; Dementia in her mother; Depression in her brother and father; Diabetes in her mother; Heart disease in her mother. Allergies  Allergen Reactions  . Trulicity [Dulaglutide] Nausea And Vomiting  . Metformin And Related Diarrhea   Current Outpatient Medications on File Prior to Visit  Medication Sig Dispense Refill  . ACCU-CHEK FASTCLIX LANCETS MISC Use to check sugar 3 times daily 300 each 5  . acetaminophen (TYLENOL) 325 MG tablet Take 2 tablets (650 mg total) by mouth every 6 (six) hours as needed for fever. 30 tablet 0  . ARIPiprazole (ABILIFY) 2 MG tablet Take 1 tablet (2 mg total) by mouth daily. 90 tablet 0  . atorvastatin (LIPITOR) 40 MG tablet Take 1 tablet (40 mg total) by mouth daily. 90 tablet 3  . Blood Glucose Monitoring Suppl (ACCU-CHEK GUIDE) w/Device KIT 1 each by Does not apply route daily. 1 kit 0  . calcium carbonate (TUMS EX) 750 MG chewable tablet Chew 1-2 tablets by mouth daily as needed for heartburn (acid indigestion).    . Cholecalciferol (VITAMIN D3 PO) Take 2 capsules by mouth daily.    . Cyanocobalamin (VITAMIN B-12 SL) Place 1 tablet under the tongue daily.    . DULoxetine (CYMBALTA) 20 MG  capsule Take 1 capsule (20 mg total) by mouth 2 (two) times daily. 180 capsule 0  . glucose blood (ACCU-CHEK GUIDE) test strip Use as instructed to check sugar 3 times daily 300 each 5  . ibuprofen (ADVIL,MOTRIN) 200 MG tablet Take 200 mg by mouth every 6 (six) hours as needed for headache (pain).    . Insulin Degludec (TRESIBA FLEXTOUCH) 200 UNIT/ML SOPN Inject 80 Units into the skin daily. 6 pen 5  . Insulin Lispro (HUMALOG KWIKPEN) 200 UNIT/ML SOPN Inject 25-30 Units into the skin 3 (three) times daily before meals. 15 pen 3  . Insulin Pen  Needle (UNIFINE PENTIPS) 32G X 4 MM MISC Use 4x a day 300 each 3  . Lancets Misc. (ACCU-CHEK FASTCLIX LANCET) KIT Use to check sugar 3 times daily 1 kit 0  . MAGNESIUM PO Take 1 tablet by mouth daily.    . polyvinyl alcohol (ARTIFICIAL TEARS) 1.4 % ophthalmic solution Place 1 drop into both eyes daily as needed for dry eyes.     No current facility-administered medications on file prior to visit.    Review of Systems Constitutional: Negative for other unusual diaphoresis, sweats, appetite or weight changes HENT: Negative for other worsening hearing loss, ear pain, facial swelling, mouth sores or neck stiffness.   Eyes: Negative for other worsening pain, redness or other visual disturbance.  Respiratory: Negative for other stridor or swelling Cardiovascular: Negative for other palpitations or other chest pain  Gastrointestinal: Negative for worsening diarrhea or loose stools, blood in stool, distention or other pain Genitourinary: Negative for hematuria, flank pain or other change in urine volume.  Musculoskeletal: Negative for myalgias or other joint swelling.  Skin: Negative for other color change, or other wound or worsening drainage.  Neurological: Negative for other syncope or numbness. Hematological: Negative for other adenopathy or swelling Psychiatric/Behavioral: Negative for hallucinations, other worsening agitation, SI, self-injury, or new decreased concentration All other system neg per pt    Objective:   Physical Exam BP 124/76   Pulse 86   Temp 98 F (36.7 C) (Oral)   Ht 5' 8"  (1.727 m)   Wt 208 lb (94.3 kg)   SpO2 95%   BMI 31.63 kg/m  VS noted, not ill appearing Constitutional: Pt is oriented to person, place, and time. Appears well-developed and well-nourished, in no significant distress and comfortable Head: Normocephalic and atraumatic  Eyes: Conjunctivae and EOM are normal. Pupils are equal, round, and reactive to light Right Ear: External ear normal without  discharge Left Ear: External ear normal without discharge Nose: Nose without discharge or deformity Mouth/Throat: Oropharynx is without other ulcerations and moist  Neck: Normal range of motion. Neck supple. No JVD present. No tracheal deviation present or significant neck LA or mass Cardiovascular: Normal rate, regular rhythm, normal heart sounds and intact distal pulses.   Pulmonary/Chest: WOB normal and breath sounds without rales or wheezing  Abdominal: Soft. Bowel sounds are normal. NT. No HSM  Musculoskeletal: Normal range of motion. Exhibits no edema Lymphadenopathy: Has no other cervical adenopathy.  Neurological: Pt is alert and oriented to person, place, and time. Pt has normal reflexes. No cranial nerve deficit. Motor grossly intact, Gait intact Skin: Skin is warm and dry. No rash noted or new ulcerations Psychiatric:  Has nervous depressed mood and affect. Behavior is normal without agitation No other exam findings Lab Results  Component Value Date   WBC 10.1 12/18/2017   HGB 14.8 12/18/2017   HCT 44.6 12/18/2017   PLT  255.0 12/18/2017   GLUCOSE 406 (H) 12/18/2017   CHOL 229 (H) 12/18/2017   TRIG 292.0 (H) 12/18/2017   HDL 40.90 12/18/2017   LDLDIRECT 157.0 12/18/2017   LDLCALC 61 07/16/2014   ALT 15 12/18/2017   AST 12 12/18/2017   NA 132 (L) 12/18/2017   K 4.5 12/18/2017   CL 96 12/18/2017   CREATININE 0.86 12/18/2017   BUN 13 12/18/2017   CO2 27 12/18/2017   TSH 3.80 12/18/2017   INR 1.21 11/04/2016   HGBA1C 10.5 (A) 08/25/2018   MICROALBUR <0.7 12/18/2017    ECG today I have personally interpreted:  NSR    Assessment & Plan:

## 2018-09-12 NOTE — Assessment & Plan Note (Signed)
Remains uncontrolled but with better compliance with meds recently, and now involved with wt loss clinic, to f/u endo as planned

## 2018-09-12 NOTE — Assessment & Plan Note (Signed)

## 2018-09-15 DIAGNOSIS — Z6831 Body mass index (BMI) 31.0-31.9, adult: Secondary | ICD-10-CM | POA: Diagnosis not present

## 2018-09-15 DIAGNOSIS — E119 Type 2 diabetes mellitus without complications: Secondary | ICD-10-CM | POA: Diagnosis not present

## 2018-09-15 DIAGNOSIS — Z713 Dietary counseling and surveillance: Secondary | ICD-10-CM | POA: Diagnosis not present

## 2018-09-15 LAB — HM DIABETES EYE EXAM

## 2018-09-23 DIAGNOSIS — Z6831 Body mass index (BMI) 31.0-31.9, adult: Secondary | ICD-10-CM | POA: Diagnosis not present

## 2018-09-23 DIAGNOSIS — E782 Mixed hyperlipidemia: Secondary | ICD-10-CM | POA: Diagnosis not present

## 2018-09-30 ENCOUNTER — Ambulatory Visit (INDEPENDENT_AMBULATORY_CARE_PROVIDER_SITE_OTHER): Payer: 59 | Admitting: Psychiatry

## 2018-09-30 DIAGNOSIS — F330 Major depressive disorder, recurrent, mild: Secondary | ICD-10-CM

## 2018-09-30 DIAGNOSIS — F411 Generalized anxiety disorder: Secondary | ICD-10-CM

## 2018-09-30 MED ORDER — DULOXETINE HCL 60 MG PO CPEP: 60.0000 mg | ORAL_CAPSULE | Freq: Every day | ORAL | 0 refills | Status: DC

## 2018-09-30 MED ORDER — DULOXETINE HCL 60 MG PO CPEP: 60.0000 mg | ORAL_CAPSULE | Freq: Every day | ORAL | 1 refills | Status: DC

## 2018-09-30 MED ORDER — ARIPIPRAZOLE 2 MG PO TABS: 2.0000 mg | ORAL_TABLET | Freq: Every day | ORAL | 0 refills | Status: DC

## 2018-09-30 MED FILL — DULoxetine HCL 60 MG CPEP: 60 | 90 days supply | Qty: 90 | Fill #0

## 2018-09-30 MED FILL — ARIPiprazole 2 MG TABS: 2 | 90 days supply | Qty: 90 | Fill #0

## 2018-09-30 NOTE — Progress Notes (Signed)
[facility] MD/PA/NP OP Progress Note  09/30/2018 4:32 PM Cassandra Morris  MRN:  [id number]  Chief Complaint: I have back to depression.  I am sleeping on and off.  HPI: Cassandra Morris came for her follow-up appointment.  On her last visit we recommended to try Cymbalta 20 mg twice a day.  She noticed much improvement in first few weeks and then she started to feel depressed and anxious again.  She is requesting Xanax because she believes that is the only thing that helps her for her anxiety.  She feels tired during the day.  She is taking Abilify and she feels her depression is not as bad.  She denies any suicidal thoughts or homicidal thought.  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  She is not happy with the political situation in the country.  She hopes that new leadership come in the country as she does not feel contrary is going in her right direction.  She gets easily tired in the house.  She stays to herself.  She did not enjoy her beach trip because she was sleeping all day.  Patient denies drinking or using any illegal substances.  She lives by herself with HER-2 dogs.  She admitted not seeing her therapist anymore since taking the medication.  She recently seen her primary care physician and her blood sugar and hemoglobin A1c was higher than before.  Patient like to start exercise to control her blood sugar.  She is also watching her sugar intake.  She denies any paranoia, hallucination, suicidal thought.  Visit Diagnosis:    ICD-10-CM   1. MDD (major depressive disorder), recurrent episode, mild (HCC) F33.0 ARIPiprazole (ABILIFY) 2 MG tablet    DULoxetine (CYMBALTA) 60 MG capsule    DISCONTINUED: DULoxetine (CYMBALTA) 60 MG capsule  2. GAD (generalized anxiety disorder) F41.1 ARIPiprazole (ABILIFY) 2 MG tablet    DULoxetine (CYMBALTA) 60 MG capsule    DISCONTINUED: DULoxetine (CYMBALTA) 60 MG capsule    Past Psychiatric History: Reviewed. H/O depression and taking medication  since 83s.  Tried Lexapro, Effexor, Depakote and Lamictal.  Took Zoloft, Xanax and Abilify but ran out when she switch provider.  No H/O inpatient treatment or any suicidal attempt.  Past Medical History:  Past Medical History:  Diagnosis Date  . Depression   . Diabetes mellitus    T2DM  . Hyperlipidemia   . Hypertension   . Obesity (BMI 30-39.9)     Past Surgical History:  Procedure Laterality Date  . CHOLECYSTECTOMY N/A 11/05/2016   Procedure: LAPAROSCOPIC CHOLECYSTECTOMY;  Surgeon: Rolm Bookbinder, MD;  Location: [facility];  Service: General;  Laterality: N/A;    Family Psychiatric History: Reviewed.  Family History:  Family History  Problem Relation Age of Onset  . Diabetes Mother   . Heart disease Mother   . Allergies Mother   . Asthma Mother   . Deep vein thrombosis Mother   . Dementia Mother   . Alcohol abuse Father   . Anxiety disorder Father   . Depression Father   . Depression Brother   . ADD / ADHD Other     Social History:  Social History   Socioeconomic History  . Marital status: Divorced    Spouse name: Not on file  . Number of children: Not on file  . Years of education: Not on file  . Highest education level: Not on file  Occupational History  . Not on file  Social Needs  .  Financial resource strain: Not on file  . Food insecurity:    Worry: Not on file    Inability: Not on file  . Transportation needs:    Medical: Not on file    Non-medical: Not on file  Tobacco Use  . Smoking status: Former Smoker    Packs/day: 0.25    Years: 9.00    Pack years: 2.25    Types: Cigarettes    Last attempt to quit: 08/27/1984    Years since quitting: 34.1  . Smokeless tobacco: Never Used  Substance and Sexual Activity  . Alcohol use: Yes    Alcohol/week: 0.0 standard drinks    Comment: rare  . Drug use: No    Comment: Uses CBD oil daily.   Marland Kitchen Sexual activity: Not Currently  Lifestyle  . Physical activity:    Days per week: Not on file    Minutes per  session: Not on file  . Stress: Not on file  Relationships  . Social connections:    Talks on phone: Not on file    Gets together: Not on file    Attends religious service: Not on file    Active member of club or organization: Not on file    Attends meetings of clubs or organizations: Not on file    Relationship status: Not on file  Other Topics Concern  . Not on file  Social History Narrative   Caffeine use: daily, soda   Regular exercise: joined gym, cut back due to current health issues          Allergies:  Allergies  Allergen Reactions  . Trulicity [Dulaglutide] Nausea And Vomiting  . Metformin And Related Diarrhea    Metabolic Disorder Labs: Recent Results (from the past 2160 hour(s))  POCT glycosylated hemoglobin (Hb A1C)     Status: Abnormal   Collection Time: 08/25/18  4:34 PM  Result Value Ref Range   Hemoglobin A1C 10.5 (A) 4.0 - 5.6 %   HbA1c POC (<> result, manual entry)     HbA1c, POC (prediabetic range)     HbA1c, POC (controlled diabetic range)    HM DIABETES EYE EXAM     Status: None   Collection Time: 09/09/18 10:55 AM  Result Value Ref Range   HM Diabetic Eye Exam     Lab Results  Component Value Date   HGBA1C 10.5 (A) 08/25/2018   No results found for: PROLACTIN Lab Results  Component Value Date   CHOL 229 (H) 12/18/2017   TRIG 292.0 (H) 12/18/2017   HDL 40.90 12/18/2017   CHOLHDL 6 12/18/2017   VLDL 58.4 (H) 12/18/2017   LDLCALC 61 07/16/2014   LDLCALC 133 (H) 11/29/2008   Lab Results  Component Value Date   TSH 3.80 12/18/2017   TSH 2.26 09/02/2015    Therapeutic Level Labs: No results found for: LITHIUM Lab Results  Component Value Date   VALPROATE 40.0 (L) 10/09/2013   No components found for:  CBMZ  Current Medications: Current Outpatient Medications  Medication Sig Dispense Refill  . ACCU-CHEK FASTCLIX LANCETS MISC Use to check sugar 3 times daily 300 each 5  . acetaminophen (TYLENOL) 325 MG tablet Take 2 tablets (650  mg total) by mouth every 6 (six) hours as needed for fever. 30 tablet 0  . ARIPiprazole (ABILIFY) 2 MG tablet Take 1 tablet (2 mg total) by mouth daily. 90 tablet 0  . atorvastatin (LIPITOR) 40 MG tablet Take 1 tablet (40 mg total) by mouth daily.  90 tablet 3  . Blood Glucose Monitoring Suppl (ACCU-CHEK GUIDE) w/Device KIT 1 each by Does not apply route daily. 1 kit 0  . calcium carbonate (TUMS EX) 750 MG chewable tablet Chew 1-2 tablets by mouth daily as needed for heartburn (acid indigestion).    . Cholecalciferol (VITAMIN D3 PO) Take 2 capsules by mouth daily.    . Cyanocobalamin (VITAMIN B-12 SL) Place 1 tablet under the tongue daily.    . DULoxetine (CYMBALTA) 20 MG capsule Take 1 capsule (20 mg total) by mouth 2 (two) times daily. 180 capsule 0  . glucose blood (ACCU-CHEK GUIDE) test strip Use as instructed to check sugar 3 times daily 300 each 5  . ibuprofen (ADVIL,MOTRIN) 200 MG tablet Take 200 mg by mouth every 6 (six) hours as needed for headache (pain).    . Insulin Degludec (TRESIBA FLEXTOUCH) 200 UNIT/ML SOPN Inject 80 Units into the skin daily. 6 pen 5  . Insulin Lispro (HUMALOG KWIKPEN) 200 UNIT/ML SOPN Inject 25-30 Units into the skin 3 (three) times daily before meals. 15 pen 3  . Insulin Pen Needle (UNIFINE PENTIPS) 32G X 4 MM MISC Use 4x a day 300 each 3  . Lancets Misc. (ACCU-CHEK FASTCLIX LANCET) KIT Use to check sugar 3 times daily 1 kit 0  . MAGNESIUM PO Take 1 tablet by mouth daily.    . polyvinyl alcohol (ARTIFICIAL TEARS) 1.4 % ophthalmic solution Place 1 drop into both eyes daily as needed for dry eyes.     No current facility-administered medications for this visit.      Musculoskeletal: Strength & Muscle Tone: within normal limits Gait & Station: normal Patient leans: N/A  Psychiatric Specialty Exam: ROS  Blood pressure 138/82, pulse 76, height 5' 8"  (1.727 m), weight 207 lb (93.9 kg).There is no height or weight on file to calculate BMI.  General Appearance:  Casual  Eye Contact:  Fair  Speech:  Slow  Volume:  Normal  Mood:  Depressed and Dysphoric  Affect:  Constricted  Thought Process:  Descriptions of Associations: Intact  Orientation:  Full (Time, Place, and Person)  Thought Content: Rumination   Suicidal Thoughts:  No  Homicidal Thoughts:  No  Memory:  Immediate;   Good Recent;   Good Remote;   Good  Judgement:  Good  Insight:  Good  Psychomotor Activity:  Normal  Concentration:  Concentration: Fair and Attention Span: Fair  Recall:  [place] of Knowledge: Good  Language: Good  Akathisia:  No  Handed:  Right  AIMS (if indicated): not done  Assets:  Communication Skills Desire for Improvement Housing Transportation  ADL's:  Intact  Cognition: WNL  Sleep:  Fair   Screenings: PHQ2-9     Office Visit from 09/12/2018 in [facility] Office Visit from 12/18/2017 in [facility] from 03/02/2015 in [facility] Patient Outreach from 01/06/2015 in [facility]  PHQ-2 Total Score  1  1  0  1       Assessment and Plan: Major depressive disorder, recurrent.  Generalized anxiety disorder.  Discussed efficacy of the Cymbalta.  Recommended to try 60 mg as she is seen improvement in the beginning but now her symptoms are coming back.  I also encouraged that she should go back to seeing therapist as it may help her coping and social skills.  So far she has no major panic attack and we will defer adding any benzodiazepine  and we will wait for increase Cymbalta response.  I also encouraged to watch her calorie intake and do regular exercise.  We will consider adding Lamictal if patient continues to struggle with blood sugar as Abilify may be contributing to her high blood sugar.  Discussed safety concerns at any time having active suicidal thoughts or homicidal thought that she need to call 911 or go to local emergency room.  Follow-up in 3  months.   Cassandra Nations, MD 09/30/2018, 4:32 PM

## 2018-10-01 DIAGNOSIS — Z6831 Body mass index (BMI) 31.0-31.9, adult: Secondary | ICD-10-CM | POA: Diagnosis not present

## 2018-10-01 DIAGNOSIS — R5383 Other fatigue: Secondary | ICD-10-CM | POA: Diagnosis not present

## 2018-10-01 DIAGNOSIS — E119 Type 2 diabetes mellitus without complications: Secondary | ICD-10-CM | POA: Diagnosis not present

## 2018-10-01 DIAGNOSIS — N951 Menopausal and female climacteric states: Secondary | ICD-10-CM | POA: Diagnosis not present

## 2018-10-08 DIAGNOSIS — E782 Mixed hyperlipidemia: Secondary | ICD-10-CM | POA: Diagnosis not present

## 2018-10-08 DIAGNOSIS — E119 Type 2 diabetes mellitus without complications: Secondary | ICD-10-CM | POA: Diagnosis not present

## 2018-10-08 DIAGNOSIS — Z6831 Body mass index (BMI) 31.0-31.9, adult: Secondary | ICD-10-CM | POA: Diagnosis not present

## 2018-10-16 DIAGNOSIS — Z6831 Body mass index (BMI) 31.0-31.9, adult: Secondary | ICD-10-CM | POA: Diagnosis not present

## 2018-10-16 DIAGNOSIS — E119 Type 2 diabetes mellitus without complications: Secondary | ICD-10-CM | POA: Diagnosis not present

## 2018-10-22 DIAGNOSIS — Z6830 Body mass index (BMI) 30.0-30.9, adult: Secondary | ICD-10-CM | POA: Diagnosis not present

## 2018-10-22 DIAGNOSIS — E782 Mixed hyperlipidemia: Secondary | ICD-10-CM | POA: Diagnosis not present

## 2018-10-28 DIAGNOSIS — R5383 Other fatigue: Secondary | ICD-10-CM | POA: Diagnosis not present

## 2018-10-28 DIAGNOSIS — N951 Menopausal and female climacteric states: Secondary | ICD-10-CM | POA: Diagnosis not present

## 2018-10-30 DIAGNOSIS — F3289 Other specified depressive episodes: Secondary | ICD-10-CM | POA: Diagnosis not present

## 2018-10-30 DIAGNOSIS — N951 Menopausal and female climacteric states: Secondary | ICD-10-CM | POA: Diagnosis not present

## 2018-10-30 DIAGNOSIS — R5383 Other fatigue: Secondary | ICD-10-CM | POA: Diagnosis not present

## 2018-11-05 DIAGNOSIS — E119 Type 2 diabetes mellitus without complications: Secondary | ICD-10-CM | POA: Diagnosis not present

## 2018-11-05 DIAGNOSIS — E782 Mixed hyperlipidemia: Secondary | ICD-10-CM | POA: Diagnosis not present

## 2018-11-05 DIAGNOSIS — Z6830 Body mass index (BMI) 30.0-30.9, adult: Secondary | ICD-10-CM | POA: Diagnosis not present

## 2018-11-10 ENCOUNTER — Encounter: Payer: Self-pay | Admitting: Physician Assistant

## 2018-11-10 ENCOUNTER — Telehealth: Payer: 59 | Admitting: Physician Assistant

## 2018-11-10 DIAGNOSIS — R509 Fever, unspecified: Secondary | ICD-10-CM

## 2018-11-10 DIAGNOSIS — N390 Urinary tract infection, site not specified: Secondary | ICD-10-CM

## 2018-11-10 NOTE — Progress Notes (Signed)
Based on what you shared with me, I feel your condition warrants further evaluation and I recommend that you be seen for a face to face office visit.     Cassandra Morris,   The records show that you may have had more than 2 UTI in the past 6 months. Due to this, it is recommended that you have a face to face evaluation to have a urine culture to determine the exact bacterial causing the UTI. Also, the low grade fever with UTI symptoms, warrant a urine culture to be completed, to exclude other infections, such as kidney infection.     NOTE: If you entered your credit card information for this eVisit, you will not be charged. You may see a "hold" on your card for the $35 but that hold will drop off and you will not have a charge processed.  If you are having a true medical emergency please call 911.  If you need an urgent face to face visit, [facility] has four urgent care centers for your convenience.  If you need care fast and have a high deductible or no insurance consider:   [email or web address] to reserve your spot online an avoid wait times  [facility] [address], [address] [place], [place] [address] 8 am to 8 pm Monday-Friday 10 am to 4 pm Saturday-Sunday *Across the street from [employer]  [place], [address] 8 am to 5 pm Monday-Friday * In the [facility] on the [facility]   The following sites will take your insurance:  . [facility] Health Urgent [place] a Provider at this Location  [address] [place], [place] [address] . 10 am to 8 pm Monday-Friday . 12 pm to 8 pm Saturday-Sunday   . [facility] Health Urgent Care at [place] a Provider at this Location  [address] [place], [place] [address] . 8 am to 8 pm Monday-Friday . 9 am to 6 pm Saturday . 11 am to 6 pm Sunday   . [facility]  Health Urgent Care at [place] Get Driving Directions  [address] [place], [place] [address] . 8 am to 8 pm Monday-Friday . 8 am to 4 pm Saturday-Sunday   Your e-visit answers were reviewed by a board certified advanced clinical practitioner to complete your personal care plan.  Thank you for using e-Visits.

## 2018-11-18 DIAGNOSIS — Z6830 Body mass index (BMI) 30.0-30.9, adult: Secondary | ICD-10-CM | POA: Diagnosis not present

## 2018-11-18 DIAGNOSIS — E782 Mixed hyperlipidemia: Secondary | ICD-10-CM | POA: Diagnosis not present

## 2018-11-18 DIAGNOSIS — R7989 Other specified abnormal findings of blood chemistry: Secondary | ICD-10-CM | POA: Diagnosis not present

## 2018-11-18 DIAGNOSIS — E119 Type 2 diabetes mellitus without complications: Secondary | ICD-10-CM | POA: Diagnosis not present

## 2018-11-20 MED FILL — HUMALOG 200 UNITS/ML KWIKPE: 200 | 28 days supply | Qty: 12 | Fill #0

## 2018-11-20 MED FILL — TRESIBA FLEXTOUCH 200 UNITS: 200 | 45 days supply | Qty: 18 | Fill #0

## 2018-11-20 MED FILL — ACCU-CHEK GUIDE TEST STRIP: 90 days supply | Qty: 300 | Fill #0

## 2018-11-25 DIAGNOSIS — Z6830 Body mass index (BMI) 30.0-30.9, adult: Secondary | ICD-10-CM | POA: Diagnosis not present

## 2018-11-25 DIAGNOSIS — E119 Type 2 diabetes mellitus without complications: Secondary | ICD-10-CM | POA: Diagnosis not present

## 2018-11-25 DIAGNOSIS — E782 Mixed hyperlipidemia: Secondary | ICD-10-CM | POA: Diagnosis not present

## 2018-11-27 ENCOUNTER — Ambulatory Visit: Payer: Self-pay | Admitting: Internal Medicine

## 2018-12-01 ENCOUNTER — Encounter: Payer: Self-pay | Admitting: Internal Medicine

## 2018-12-01 ENCOUNTER — Ambulatory Visit (INDEPENDENT_AMBULATORY_CARE_PROVIDER_SITE_OTHER): Payer: 59 | Admitting: Internal Medicine

## 2018-12-01 DIAGNOSIS — Z794 Long term (current) use of insulin: Secondary | ICD-10-CM

## 2018-12-01 DIAGNOSIS — E669 Obesity, unspecified: Secondary | ICD-10-CM

## 2018-12-01 DIAGNOSIS — E1140 Type 2 diabetes mellitus with diabetic neuropathy, unspecified: Secondary | ICD-10-CM | POA: Diagnosis not present

## 2018-12-01 DIAGNOSIS — E785 Hyperlipidemia, unspecified: Secondary | ICD-10-CM | POA: Diagnosis not present

## 2018-12-01 NOTE — Progress Notes (Signed)
Subjective:     Patient ID: Cassandra Morris, female   DOB: [birth year], 68 y.o.   MRN: [id number]  Patient location: Work My location: Office  Referring Provider: Biagio Borg, MD  I connected with the patient on 12/01/18 at  3:10 PM EDT by a video enabled telemedicine application and verified that I am speaking with the correct person.   I discussed the limitations of evaluation and management by telemedicine and the availability of in person appointments. The patient expressed understanding and agreed to proceed.   Details of the encounter are shown below.  Diabetes    Cassandra Morris is a pleasant 68 y.o. woman, returning for f/u for DM2, dx 2011, insulin-dependent, uncontrolled, with complications (PN). Last visit 3.5 months ago.  Patient's diabetes control is affected by her depression and intermittent compliant with her medications.  Continues to see a psychiatrist.  Before last visit, she changed her psychiatrist and she had a change in medication.  She felt more confused and her sugars were worse.  She was missing a lot of insulin doses.  However, we did not change the regimen significantly she was preparing to start a diet program [employer] - almost no carbs) this was covered by her insurance.  Reviewed HbA1c levels: Lab Results  Component Value Date   HGBA1C 10.5 (A) 08/25/2018   HGBA1C 9.8 (A) 04/25/2018   HGBA1C 11.6 (H) 12/18/2017   HGBA1C 8.2 12/21/2016   HGBA1C 7.6 09/24/2016   HGBA1C 10.8 06/12/2016   HGBA1C 10.3 03/12/2016   HGBA1C 9.9 12/12/2015   HGBA1C 11.1 (H) 09/02/2015   HGBA1C 8.8 (H) 02/24/2015   HGBA1C 8.8 (H) 11/29/2014   HGBA1C 7.4 (H) 07/28/2014   HGBA1C 12.0 (H) 04/20/2014   HGBA1C 8.3 (H) 05/05/2013   HGBA1C 11.9 (H) 10/24/2012   She is currently on: - Lantus 60 >> 80 units after dinner >> 40 units at night when on the diet - Humalog: 25 >> 25-30 units before meals >> 10 units before meals when on the diet Stop metformin ER before 07/2018 due to  diarrhea. She stopped Trulicity due to nausea.  She checks her sugars sporadically: - am:  350s >> 174-334, 392 >> 240-350 >> 180s - 2h after b'fast: 211-425 >> 95-199, 220 >> n/c  - prelunch:125, 166-280, 350 >> 160-280 >> 120s - 2h postlunch 194-390 >> 123-216 >> n/c - predinner:  67 x1, 184-328 >> n/c >> 160-271 >> n/c - 2h post dinner:  88 >> n/c >> 196-168 >> n/c - bedtime: 122, 254 >> n/c >> 223 >> n/c Lows: 70 (took too much insulin) >> 45 (before decreasing the dose of insulin). Highest: 425 >> 220.  Glucometer: OneTouch Verio  -No CKD: Lab Results  Component Value Date   BUN 13 12/18/2017   Lab Results  Component Value Date   CREATININE 0.86 12/18/2017  Not on an ACE inhibitor/ARB.  -+ HL: Lab Results  Component Value Date   CHOL 229 (H) 12/18/2017   HDL 40.90 12/18/2017   LDLCALC 61 07/16/2014   LDLDIRECT 157.0 12/18/2017   TRIG 292.0 (H) 12/18/2017   CHOLHDL 6 12/18/2017  On Lipitor 40.  -She had an eye exam in 07/2018: No DR. Dr Katy Fitch.  She has a history of iritis.  She also has a history of cataract surgery.  -No numbness/tingling in feet.  She also has HTN. Pt has severe depression, which she has every summer >> cannot control her sugars well.  She saw a psychiatrist  in 11/2013 >> Depakote and Lamictal >> she tapered these off as she felt terrible on these >> She now sees and other psychiatrist >> Zoloft and Xanax.  However, she was switched from Zoloft to Cymbalta >> more confused.  She bought a house in [place].  Review of Systems Constitutional: no weight gain/+ weight loss (5 lbs), no fatigue, no subjective hyperthermia, no subjective hypothermia Eyes: no blurry vision, no xerophthalmia ENT: no sore throat, no nodules palpated in neck, no dysphagia, no odynophagia, no hoarseness Cardiovascular: no CP/no SOB/no palpitations/no leg swelling Respiratory: no cough/no SOB/no wheezing Gastrointestinal: no N/no V/no D/no C/no acid  reflux Musculoskeletal: no muscle aches/no joint aches Skin: no rashes, no hair loss Neurological: no tremors/no numbness/no tingling/no dizziness  I reviewed pt's medications, allergies, PMH, social hx, family hx, and changes were documented in the history of present illness. Otherwise, unchanged from my initial visit note.  Past Medical History:  Diagnosis Date  . Depression   . Diabetes mellitus    T2DM  . Hyperlipidemia   . Hypertension   . Obesity (BMI 30-39.9)    Past Surgical History:  Procedure Laterality Date  . CHOLECYSTECTOMY N/A 11/05/2016   Procedure: LAPAROSCOPIC CHOLECYSTECTOMY;  Surgeon: Rolm Bookbinder, MD;  Location: [facility] OR;  Service: General;  Laterality: N/A;   Social History   Socioeconomic History  . Marital status: Divorced    Spouse name: Not on file  . Number of children: Not on file  . Years of education: Not on file  . Highest education level: Not on file  Occupational History  . Not on file  Social Needs  . Financial resource strain: Not on file  . Food insecurity:    Worry: Not on file    Inability: Not on file  . Transportation needs:    Medical: Not on file    Non-medical: Not on file  Tobacco Use  . Smoking status: Former Smoker    Packs/day: 0.25    Years: 9.00    Pack years: 2.25    Types: Cigarettes    Last attempt to quit: 08/27/1984    Years since quitting: 34.2  . Smokeless tobacco: Never Used  Substance and Sexual Activity  . Alcohol use: Yes    Alcohol/week: 0.0 standard drinks    Comment: rare  . Drug use: No    Comment: Uses CBD oil daily.   Marland Kitchen Sexual activity: Not Currently  Lifestyle  . Physical activity:    Days per week: Not on file    Minutes per session: Not on file  . Stress: Not on file  Relationships  . Social connections:    Talks on phone: Not on file    Gets together: Not on file    Attends religious service: Not on file    Active member of club or organization: Not on file    Attends meetings of  clubs or organizations: Not on file    Relationship status: Not on file  . Intimate partner violence:    Fear of current or ex partner: Not on file    Emotionally abused: Not on file    Physically abused: Not on file    Forced sexual activity: Not on file  Other Topics Concern  . Not on file  Social History Narrative   Caffeine use: daily, soda   Regular exercise: joined gym, cut back due to current health issues         Current Outpatient Medications on File Prior to  Visit  Medication Sig Dispense Refill  . ACCU-CHEK FASTCLIX LANCETS MISC Use to check sugar 3 times daily 300 each 5  . acetaminophen (TYLENOL) 325 MG tablet Take 2 tablets (650 mg total) by mouth every 6 (six) hours as needed for fever. 30 tablet 0  . ARIPiprazole (ABILIFY) 2 MG tablet Take 1 tablet (2 mg total) by mouth daily. 90 tablet 0  . atorvastatin (LIPITOR) 40 MG tablet Take 1 tablet (40 mg total) by mouth daily. 90 tablet 3  . Blood Glucose Monitoring Suppl (ACCU-CHEK GUIDE) w/Device KIT 1 each by Does not apply route daily. 1 kit 0  . calcium carbonate (TUMS EX) 750 MG chewable tablet Chew 1-2 tablets by mouth daily as needed for heartburn (acid indigestion).    . Cholecalciferol (VITAMIN D3 PO) Take 2 capsules by mouth daily.    . Cyanocobalamin (VITAMIN B-12 SL) Place 1 tablet under the tongue daily.    . DULoxetine (CYMBALTA) 60 MG capsule Take 1 capsule (60 mg total) by mouth daily. 90 capsule 0  . glucose blood (ACCU-CHEK GUIDE) test strip Use as instructed to check sugar 3 times daily 300 each 5  . ibuprofen (ADVIL,MOTRIN) 200 MG tablet Take 200 mg by mouth every 6 (six) hours as needed for headache (pain).    . Insulin Degludec (TRESIBA FLEXTOUCH) 200 UNIT/ML SOPN Inject 80 Units into the skin daily. 6 pen 5  . Insulin Lispro (HUMALOG KWIKPEN) 200 UNIT/ML SOPN Inject 25-30 Units into the skin 3 (three) times daily before meals. 15 pen 3  . Insulin Pen Needle (UNIFINE PENTIPS) 32G X 4 MM MISC Use 4x a day  300 each 3  . Lancets Misc. (ACCU-CHEK FASTCLIX LANCET) KIT Use to check sugar 3 times daily 1 kit 0  . MAGNESIUM PO Take 1 tablet by mouth daily.    . polyvinyl alcohol (ARTIFICIAL TEARS) 1.4 % ophthalmic solution Place 1 drop into both eyes daily as needed for dry eyes.     No current facility-administered medications on file prior to visit.    Allergies  Allergen Reactions  . Trulicity [Dulaglutide] Nausea And Vomiting  . Metformin And Related Diarrhea   Family History  Problem Relation Age of Onset  . Diabetes Mother   . Heart disease Mother   . Allergies Mother   . Asthma Mother   . Deep vein thrombosis Mother   . Dementia Mother   . Alcohol abuse Father   . Anxiety disorder Father   . Depression Father   . Depression Brother   . ADD / ADHD Other     Objective:   Physical Exam There were no vitals taken for this visit. There is no height or weight on file to calculate BMI. Wt Readings from Last 3 Encounters:  09/12/18 208 lb (94.3 kg)  08/25/18 205 lb (93 kg)  04/25/18 204 lb 12.8 oz (92.9 kg)   Constitutional:  in NAD  The physical exam was not performed (virtual visit).  Assessment:     1. DM2, insulin-dependent, uncontrolled, with complications - PN  2. HL  3. Obesity  Plan:     1.  Patient with history of poorly controlled diabetes with fluctuating levels of control depending on the time of the year.  Initially she had more depression during the summer because of the hot weather and her sugars were worse then.  However, in the last few years, her sugars are high throughout the year.  At last visit, she was missing many insulin  doses I strongly advised her to try to take the insulin before each meal.  Also, she was drinking regular Coca-Cola and I advised her to try to stop since we cannot control her diabetes if she is drinking soft drinks.  At last visit, we changed her basal insulin to [place] U200 and her Humalog U100 to Humalog U200 to optimize her  regimen.  However, we discussed that her depression is a limiting factor in controlling her diabetes.  She was having confusion from her new depression medications she was also planning to start a diet at last visit so we did not make changes in her insulin doses then. -At this visit, she tells me that her sugars improved significantly after she started the diet (low-carb diet) to the point of lows in the 40s so she had to decrease the doses of insulin to more than half of the previous doses.  Now, due to the coronavirus pandemic she relaxed it so that her sugars started to increase again.  They are still much better than at last visit.  However, she is planning to go back on the diet and I advised her to go to the lower doses of insulin if she does that. - I advised her to: Patient Instructions  Please continue: - Tresiba U200 80 units at bedtime - Humalog U200 25-30 units before meals.  If you go back on the [place] Diet, please decrease the doses to: - Tresiba U200 40 units at bedtime - Humalog U200 10-15 units before meals.  PLEASE TAKE THE INSULIN BEFORE EACH MEAL.  Please come back for a follow-up appointment in 3-4 months.  - We will check her HbA1c when she returns to clinic - continue checking sugars at different times of the day - check 3x a day, rotating checks - advised for yearly eye exams >> she is UTD - Return to clinic in 3-4 mo with sugar log     2. HL - Reviewed latest lipid panel from 11/2017: LDL much worse after stopping statin.  Since then, Lipitor 40 was restarted. Lab Results  Component Value Date   CHOL 229 (H) 12/18/2017   HDL 40.90 12/18/2017   LDLCALC 61 07/16/2014   LDLDIRECT 157.0 12/18/2017   TRIG 292.0 (H) 12/18/2017   CHOLHDL 6 12/18/2017  - Continues the statin without side effects.  3. Obesity -At last visit discussed about stopping regular sodas about stopping regular sodas. -unfortunately, she could not tolerate GLP-1 receptor agonists -started [employer] after our last OV  Philemon Kingdom, MD PhD [facility] Endocrinology

## 2018-12-01 NOTE — Patient Instructions (Signed)
Please continue: - Tresiba U200 80 units at bedtime - Humalog U200 25-30 units before meals.  PLEASE TAKE THE INSULIN BEFORE EACH MEAL.  STOP REGULAR SODAS.  Please come back for a follow-up appointment in 3 months.

## 2018-12-15 ENCOUNTER — Ambulatory Visit (HOSPITAL_COMMUNITY)
Admission: EM | Admit: 2018-12-15 | Discharge: 2018-12-15 | Disposition: A | Discharge status: Home / Self Care | Payer: 59 | Attending: Family Medicine | Admitting: Family Medicine

## 2018-12-15 ENCOUNTER — Encounter (HOSPITAL_COMMUNITY): Payer: Self-pay

## 2018-12-15 DIAGNOSIS — R220 Localized swelling, mass and lump, head: Secondary | ICD-10-CM | POA: Diagnosis not present

## 2018-12-15 MED ORDER — ACETAMINOPHEN-CODEINE #3 300-30 MG PO TABS: 1.0000 | ORAL_TABLET | Freq: Four times a day (QID) | ORAL | 0 refills | Status: AC | PRN

## 2018-12-15 MED ORDER — IBUPROFEN 400 MG PO TABS: 400.0000 mg | ORAL_TABLET | Freq: Four times a day (QID) | ORAL | 0 refills | Status: AC | PRN

## 2018-12-15 MED ORDER — AMOXICILLIN-POT CLAVULANATE 875-125 MG PO TABS: 1.0000 | ORAL_TABLET | Freq: Two times a day (BID) | ORAL | 0 refills | Status: DC

## 2018-12-15 MED ORDER — CEFTRIAXONE SODIUM 1 G IJ SOLR
1.0000 g | Freq: Once | INTRAMUSCULAR | Status: AC
  Administered 2018-12-15: 1 g via INTRAMUSCULAR

## 2018-12-15 MED ORDER — CEFTRIAXONE SODIUM 1 G IJ SOLR
INTRAMUSCULAR | Status: AC
  Filled 2018-12-15: qty 10

## 2018-12-15 MED FILL — AMOX-CLAV 875-125 MG TABLET: 875-125 | 7 days supply | Qty: 14 | Fill #0

## 2018-12-15 MED FILL — ACETAMINOPHEN/COD #3 TABLET: 300-30 | 2 days supply | Qty: 15 | Fill #0

## 2018-12-15 MED FILL — IBUPROFEN 400 MG TABS: 400 | 8 days supply | Qty: 30 | Fill #0

## 2018-12-15 NOTE — Discharge Instructions (Signed)
We are treating you for dental infection.  Antibiotic injection given here in the clinic Sending more antibiotics to the pharmacy  Ibuprofen and tylenol 3 for pain.  If your symptoms worsen despite treatment go to the ER  Call your dentist.

## 2018-12-15 NOTE — ED Provider Notes (Signed)
[facility]    CSN: [id number] Arrival date & time: 12/15/18  3762     History   Chief Complaint Chief Complaint  Patient presents with  . Facial Pain    HPI Cassandra Morris is a 68 y.o. female.   Pt is a 68 year old female that presents with left facial swelling, ear pain and dental pain. This has been present and worsening over the past 3 days.  She has been taking Excedrin Migraine for the pain with no relief.  Reports that she has had mild low-grade fever.  She has known dental issues at the top of her left mouth and is in need of extractions. Denies any trismus or trouble swallowing.   ROS per HPI      Past Medical History:  Diagnosis Date  . Depression   . Diabetes mellitus    T2DM  . Hyperlipidemia   . Hypertension   . Obesity (BMI 30-39.9)     Patient Active Problem List   Diagnosis Date Noted  . Obesity 12/01/2018  . Hyperlipidemia 12/23/2017  . Preventative health care 12/18/2017  . Fatigue 12/18/2017  . Anemia 12/18/2017  . Hypokalemia 11/07/2016  . Acute calculous cholecystitis 11/04/2016  . Type 2 diabetes mellitus with diabetic neuropathy, with long-term current use of insulin ([place]) 03/12/2016  . Depression 12/02/2007    Past Surgical History:  Procedure Laterality Date  . CHOLECYSTECTOMY N/A 11/05/2016   Procedure: LAPAROSCOPIC CHOLECYSTECTOMY;  Surgeon: Rolm Bookbinder, MD;  Location: [facility];  Service: General;  Laterality: N/A;    OB History   No obstetric history on file.      Home Medications    Prior to Admission medications   Medication Sig Start Date End Date Taking? Authorizing Provider  ACCU-CHEK FASTCLIX LANCETS MISC Use to check sugar 3 times daily 07/03/17   Philemon Kingdom, MD  acetaminophen (TYLENOL) 325 MG tablet Take 2 tablets (650 mg total) by mouth every 6 (six) hours as needed for fever. 11/08/16   Robbie Lis, MD  acetaminophen-codeine (TYLENOL #3) 300-30 MG tablet Take 1-2 tablets by mouth every 6  (six) hours as needed for moderate pain. 12/15/18   Loura Halt A, NP  amoxicillin-clavulanate (AUGMENTIN) 875-125 MG tablet Take 1 tablet by mouth every 12 (twelve) hours. 12/15/18   Yama Nielson, Tressia Miners A, NP  ARIPiprazole (ABILIFY) 2 MG tablet Take 1 tablet (2 mg total) by mouth daily. 09/30/18   Arfeen, Arlyce Harman, MD  atorvastatin (LIPITOR) 40 MG tablet Take 1 tablet (40 mg total) by mouth daily. 12/18/17   Biagio Borg, MD  Blood Glucose Monitoring Suppl (ACCU-CHEK GUIDE) w/Device KIT 1 each by Does not apply route daily. 10/10/16   Philemon Kingdom, MD  calcium carbonate (TUMS EX) 750 MG chewable tablet Chew 1-2 tablets by mouth daily as needed for heartburn (acid indigestion).    [provider]  Cholecalciferol (VITAMIN D3 PO) Take 2 capsules by mouth daily.    [provider]  Cyanocobalamin (VITAMIN B-12 SL) Place 1 tablet under the tongue daily.    [provider]  DULoxetine (CYMBALTA) 60 MG capsule Take 1 capsule (60 mg total) by mouth daily. 09/30/18   Arfeen, Arlyce Harman, MD  glucose blood (ACCU-CHEK GUIDE) test strip Use as instructed to check sugar 3 times daily 12/23/17   Philemon Kingdom, MD  ibuprofen (ADVIL) 400 MG tablet Take 1 tablet (400 mg total) by mouth every 6 (six) hours as needed. 12/15/18   Orvan July,  NP  Insulin Degludec (TRESIBA FLEXTOUCH) 200 UNIT/ML SOPN Inject 80 Units into the skin daily. 08/25/18   Philemon Kingdom, MD  Insulin Lispro (HUMALOG KWIKPEN) 200 UNIT/ML SOPN Inject 25-30 Units into the skin 3 (three) times daily before meals. 08/25/18   Philemon Kingdom, MD  Insulin Pen Needle (UNIFINE PENTIPS) 32G X 4 MM MISC Use 4x a day 08/25/18   Philemon Kingdom, MD  Lancets Misc. (ACCU-CHEK FASTCLIX LANCET) KIT Use to check sugar 3 times daily 07/03/17   Philemon Kingdom, MD  MAGNESIUM PO Take 1 tablet by mouth daily.    [provider]  polyvinyl alcohol (ARTIFICIAL TEARS) 1.4 % ophthalmic solution Place 1 drop into both eyes daily as needed  for dry eyes.    [provider]    Family History Family History  Problem Relation Age of Onset  . Diabetes Mother   . Heart disease Mother   . Allergies Mother   . Asthma Mother   . Deep vein thrombosis Mother   . Dementia Mother   . Alcohol abuse Father   . Anxiety disorder Father   . Depression Father   . Depression Brother   . ADD / ADHD Other     Social History Social History   Tobacco Use  . Smoking status: Former Smoker    Packs/day: 0.25    Years: 9.00    Pack years: 2.25    Types: Cigarettes    Last attempt to quit: 08/27/1984    Years since quitting: 34.3  . Smokeless tobacco: Never Used  Substance Use Topics  . Alcohol use: Yes    Alcohol/week: 0.0 standard drinks    Comment: rare  . Drug use: No    Comment: Uses CBD oil daily.      Allergies   Trulicity [dulaglutide] and Metformin and related   Review of Systems Review of Systems   Physical Exam Triage Vital Signs ED Triage Vitals [12/15/18 0826]  Enc Vitals Group     BP 134/77     Pulse Rate (!) 110     Resp 18     Temp 99.6 F (37.6 C)     Temp Source Oral     SpO2 97 %     Weight 200 lb (90.7 kg)     Height      Head Circumference      Peak Flow      Pain Score 6     Pain Loc      Pain Edu?      Excl. in [place]?    No data found.  Updated Vital Signs BP 134/77 (BP Location: Left Arm)   Pulse (!) 110   Temp 99.6 F (37.6 C) (Oral)   Resp 18   Wt 200 lb (90.7 kg)   SpO2 97%   BMI 30.41 kg/m   Visual Acuity Right Eye Distance:   Left Eye Distance:   Bilateral Distance:    Right Eye Near:   Left Eye Near:    Bilateral Near:     Physical Exam Vitals signs and nursing note reviewed.  Constitutional:      Appearance: Normal appearance.  HENT:     Head: Normocephalic and atraumatic.     Comments: Left sided facial swelling extending into her left eye.  No trismus     Right Ear: Tympanic membrane and ear canal normal.     Left Ear: Tympanic membrane and ear  canal normal.     Mouth/Throat:  Mouth: Mucous membranes are moist.     Dentition: Dental tenderness, gingival swelling, dental caries and dental abscesses present.  Neck:     Musculoskeletal: Normal range of motion.  Pulmonary:     Effort: Pulmonary effort is normal.  Musculoskeletal: Normal range of motion.  Lymphadenopathy:     Cervical: No cervical adenopathy.  Skin:    General: Skin is warm and dry.  Neurological:     Mental Status: She is alert.  Psychiatric:        Mood and Affect: Mood normal.      UC Treatments / Results  Labs (all labs ordered are listed, but only abnormal results are displayed) Labs Reviewed - No data to display  EKG None  Radiology No results found.  Procedures Procedures (including critical care time)  Medications Ordered in UC Medications  cefTRIAXone (ROCEPHIN) injection 1 g (1 g Intramuscular Given 12/15/18 0852)    Initial Impression / Assessment and Plan / UC Course  I have reviewed the triage vital signs and the nursing notes.  Pertinent labs & imaging results that were available during my care of the patient were reviewed by me and considered in my medical decision making (see chart for details).      Dental infection  Treating for dental infection Rocephin injection given here in the clinic and sending home with Augmentin for abx coverage.  Ibuprofen and tylenol 3 for pain Strict instructions given that if her symptoms worsen she will need to go to the ER.  Pt understanding and agreed. Otherwise she will follow up with her dentist.   Final Clinical Impressions(s) / UC Diagnoses   Final diagnoses:  Facial swelling     Discharge Instructions     We are treating you for dental infection.  Antibiotic injection given here in the clinic Sending more antibiotics to the pharmacy  Ibuprofen and tylenol 3 for pain.  If your symptoms worsen despite treatment go to the ER  Call your dentist.     ED Prescriptions     Medication Sig Dispense Auth. Provider   amoxicillin-clavulanate (AUGMENTIN) 875-125 MG tablet Take 1 tablet by mouth every 12 (twelve) hours. 14 tablet Lovella Hardie A, NP   acetaminophen-codeine (TYLENOL #3) 300-30 MG tablet Take 1-2 tablets by mouth every 6 (six) hours as needed for moderate pain. 15 tablet Khamille Beynon A, NP   ibuprofen (ADVIL) 400 MG tablet Take 1 tablet (400 mg total) by mouth every 6 (six) hours as needed. 30 tablet Loura Halt A, NP     Controlled Substance Prescriptions [place] Controlled Substance Registry consulted? Not Applicable   Orvan July, NP 12/15/18 [phone]

## 2018-12-15 NOTE — ED Triage Notes (Signed)
Pt cc she has swelling on her left side of her face and pr states she has a sinus infection. She also has ear discomfort. Pt has tried sinus meds and they are not working. This has been going on for 3 days the swelling got worst last night.

## 2018-12-23 ENCOUNTER — Encounter: Payer: 59 | Admitting: Internal Medicine

## 2018-12-29 ENCOUNTER — Ambulatory Visit (INDEPENDENT_AMBULATORY_CARE_PROVIDER_SITE_OTHER): Payer: 59 | Admitting: Psychiatry

## 2018-12-29 ENCOUNTER — Other Ambulatory Visit: Payer: Self-pay

## 2018-12-29 ENCOUNTER — Encounter (HOSPITAL_COMMUNITY): Payer: Self-pay | Admitting: Psychiatry

## 2018-12-29 DIAGNOSIS — F411 Generalized anxiety disorder: Secondary | ICD-10-CM | POA: Diagnosis not present

## 2018-12-29 DIAGNOSIS — F330 Major depressive disorder, recurrent, mild: Secondary | ICD-10-CM | POA: Diagnosis not present

## 2018-12-29 MED ORDER — DULOXETINE HCL 60 MG PO CPEP: 60.0000 mg | ORAL_CAPSULE | Freq: Every day | ORAL | 0 refills | Status: DC

## 2018-12-29 MED ORDER — ARIPIPRAZOLE 2 MG PO TABS: 2.0000 mg | ORAL_TABLET | Freq: Every day | ORAL | 0 refills | Status: DC

## 2018-12-29 MED FILL — ARIPIPRAZOLE 2 MG TABS: 2 | 90 days supply | Qty: 90 | Fill #0

## 2018-12-29 MED FILL — DULOXETINE HCL 60 MG CPEP: 60 | 90 days supply | Qty: 90 | Fill #0

## 2018-12-29 NOTE — Progress Notes (Signed)
Virtual Visit via Telephone Note  I connected with Cassandra Morris on 12/29/18 at  4:20 PM EDT by telephone and verified that I am speaking with the correct person using two identifiers.   I discussed the limitations, risks, security and privacy concerns of performing an evaluation and management service by telephone and the availability of in person appointments. I also discussed with the patient that there may be a patient responsible charge related to this service. The patient expressed understanding and agreed to proceed.   History of Present Illness: Patient is evaluated through phone session.  She is feeling better since Cymbalta dose increase.  She is still anxious but denies any crying spells or any feeling of hopelessness.  She is not happy with pandemic coronavirus situation as she has to go to work and she has to wear mask all the time.  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  She also not happy with the current political situation.  She reported no tremors shakes or any EPS.  She is checking her blood sugar now regularly but is still have trouble keeping her diet under control.  Sometimes she does not watch her daily intakes.  She lives with her 2 dogs by herself.  She denies any irritability, anger, mania or any psychosis.  She like to continue her current medication.  Past Psychiatric History: Reviewed. H/O depression and taking medications since 30's. Tried Lexapro, Effexor, Depakote and Lamictal. Took Zoloft, Xanax and Abilify but ran out when she switch provider. No H/O inpatient treatment or any suicidal attempt.    Observations/Objective: Mental status exam done on the phone.  She describes her mood anxious.  Her speech is slow but clear and coherent.  Her thought process logical and goal-directed.  She denies any auditory or visual hallucination.  There were no flight of ideas or loose association.  Her attention concentration is fair.  She denies any active or passive  suicidal thoughts or homicidal thoughts.  She is alert and oriented x3.  There were no grandiosity or paranoia.  Her fund of knowledge is average.  Her cognition is intact.  She reported no tremors, shakes or any EPS.  Her insight judgment is okay.  Assessment and Plan: Major depressive disorder, recurrent.  Generalized anxiety disorder.  Patient doing better since dose of Cymbalta increased to 60 mg.  She reported no side effects.  I encouraged to watch her calorie intake and try to do regular exercise and keep checking her blood sugar.  She does not have any panic attacks since the Cymbalta increase.  Continue Cymbalta 60 mg daily and Abilify 2 mg daily.  Recommended to call us back if she is any question or any concern.  Follow-up in 3 months.  Follow Up Instructions:    I discussed the assessment and treatment plan with the patient. The patient was provided an opportunity to ask questions and all were answered. The patient agreed with the plan and demonstrated an understanding of the instructions.   The patient was advised to call back or seek an in-person evaluation if the symptoms worsen or if the condition fails to improve as anticipated.  I provided 15 minutes of non-face-to-face time during this encounter.   Kathlee Nations, MD

## 2019-03-18 MED FILL — PENICILLIN VK 500 MG TABLET: 500 | 8 days supply | Qty: 29 | Fill #0

## 2019-03-30 MED FILL — HUMALOG 200 UNITS/ML KWIKPE: 200 | 28 days supply | Qty: 12 | Fill #0

## 2019-03-30 MED FILL — TRESIBA FLEXTOUCH 200 UNITS: 200 | 45 days supply | Qty: 18 | Fill #0

## 2019-04-01 ENCOUNTER — Other Ambulatory Visit: Payer: Self-pay

## 2019-04-01 ENCOUNTER — Ambulatory Visit (INDEPENDENT_AMBULATORY_CARE_PROVIDER_SITE_OTHER): Payer: 59 | Admitting: Psychiatry

## 2019-04-01 ENCOUNTER — Encounter (HOSPITAL_COMMUNITY): Payer: Self-pay | Admitting: Psychiatry

## 2019-04-01 DIAGNOSIS — F330 Major depressive disorder, recurrent, mild: Secondary | ICD-10-CM

## 2019-04-01 DIAGNOSIS — F411 Generalized anxiety disorder: Secondary | ICD-10-CM | POA: Diagnosis not present

## 2019-04-01 MED ORDER — ARIPIPRAZOLE 2 MG PO TABS: 2.0000 mg | ORAL_TABLET | Freq: Every day | ORAL | 0 refills | Status: DC

## 2019-04-01 MED ORDER — DULOXETINE HCL 30 MG PO CPEP: 90.0000 mg | ORAL_CAPSULE | Freq: Every day | ORAL | 2 refills | Status: DC

## 2019-04-01 MED FILL — ARIPiprazole 2 MG TABS: 2 | 90 days supply | Qty: 90 | Fill #0

## 2019-04-01 MED FILL — DULoxetine HCL 30 MG CPEP: 30 | 30 days supply | Qty: 90 | Fill #0

## 2019-04-01 NOTE — Progress Notes (Signed)
Virtual Visit via Telephone Note  I connected with Cassandra Morris on 04/01/19 at  4:20 PM EDT by telephone and verified that I am speaking with the correct person using two identifiers.   I discussed the limitations, risks, security and privacy concerns of performing an evaluation and management service by telephone and the availability of in person appointments. I also discussed with the patient that there may be a patient responsible charge related to this service. The patient expressed understanding and agreed to proceed.   History of Present Illness: Patient was evaluated by phone session.  She is taking Cymbalta 60 mg but he still feels anxious and nervous.  She admitted sometimes crying spells.  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  She also not happy with the current political situation.  She is trying to watch her calorie intake but struggled to keep her blood sugar normal.  There are nights when she has to wake up in the night for the bathroom.  She has limited support.  She lives with the dog.  She feels her anxiety in the beginning got better with Cymbalta but now it is wearing off.  She is taking Abilify but does not help her anxiety.  Her depression is better.  She has been tolerating her medication.  Occasionally she has tremors with Abilify but no major concern.  She denies any feeling of hopelessness or worthlessness.  She denies any or any hallucination.   Past Psychiatric History:Reviewed. H/Odepression and taking medications since 30's. TriedLexapro, Effexor, Depakote and Lamictal.TookZoloft, Xanax and Abilify butran outwhen she switch provider.No H/Oinpatient treatment or any suicidal attempt.   Psychiatric Specialty Exam: Physical Exam  ROS  There were no vitals taken for this visit.There is no height or weight on file to calculate BMI.  General Appearance: NA  Eye Contact:  NA  Speech:  Clear and Coherent and Slow  Volume:   Normal  Mood:  Anxious and Dysphoric  Affect:  NA  Thought Process:  Goal Directed  Orientation:  Full (Time, Place, and Person)  Thought Content:  Rumination  Suicidal Thoughts:  No  Homicidal Thoughts:  No  Memory:  Immediate;   Good Recent;   Good Remote;   Good  Judgement:  Good  Insight:  Good  Psychomotor Activity:  NA  Concentration:  Concentration: Good and Attention Span: Good  Recall:  Good  Fund of Knowledge:  Good  Language:  Good  Akathisia:  No  Handed:  Right  AIMS (if indicated):     Assets:  Communication Skills Desire for Improvement Housing Resilience Social Support Talents/Skills  ADL's:  Intact  Cognition:  WNL  Sleep:   fair      Assessment and Plan: Major depressive disorder, recurrent.  Generalized anxiety disorder.  Recommend to try Cymbalta 90 mg daily to help anxiety and nervousness.  Continue Abilify 2 mg daily.  Recommended to call us back if she has any question or any concern.  Follow-up in 3 months.  Follow Up Instructions:    I discussed the assessment and treatment plan with the patient. The patient was provided an opportunity to ask questions and all were answered. The patient agreed with the plan and demonstrated an understanding of the instructions.   The patient was advised to call back or seek an in-person evaluation if the symptoms worsen or if the condition fails to improve as anticipated.  I provided 20 minutes of non-face-to-face time during  this encounter.   Kathlee Nations, MD

## 2019-04-02 MED FILL — AMOXICILLIN 875 MG TABS: 875 | 7 days supply | Qty: 14 | Fill #0

## 2019-04-02 MED FILL — IBUPROFEN 600 MG TABLET: 600 | 6 days supply | Qty: 20 | Fill #0

## 2019-06-11 ENCOUNTER — Other Ambulatory Visit (HOSPITAL_COMMUNITY): Payer: Self-pay | Admitting: Psychiatry

## 2019-06-11 DIAGNOSIS — F330 Major depressive disorder, recurrent, mild: Secondary | ICD-10-CM

## 2019-06-11 DIAGNOSIS — F411 Generalized anxiety disorder: Secondary | ICD-10-CM

## 2019-06-11 MED FILL — TRESIBA FLEXTOUCH 200 UNITS: 200 | 45 days supply | Qty: 18 | Fill #1

## 2019-06-11 MED FILL — DULoxetine HCL 30 MG CPEP: 30 | 30 days supply | Qty: 90 | Fill #1

## 2019-06-11 MED FILL — HUMALOG 200 UNITS/ML KWIKPE: 200 | 28 days supply | Qty: 12 | Fill #1

## 2019-07-02 ENCOUNTER — Ambulatory Visit (INDEPENDENT_AMBULATORY_CARE_PROVIDER_SITE_OTHER): Payer: 59 | Admitting: Psychiatry

## 2019-07-02 ENCOUNTER — Other Ambulatory Visit: Payer: Self-pay

## 2019-07-02 ENCOUNTER — Encounter (HOSPITAL_COMMUNITY): Payer: Self-pay | Admitting: Psychiatry

## 2019-07-02 DIAGNOSIS — F411 Generalized anxiety disorder: Secondary | ICD-10-CM | POA: Diagnosis not present

## 2019-07-02 DIAGNOSIS — F330 Major depressive disorder, recurrent, mild: Secondary | ICD-10-CM | POA: Diagnosis not present

## 2019-07-02 MED ORDER — ARIPIPRAZOLE 2 MG PO TABS: 2.0000 mg | ORAL_TABLET | Freq: Every day | ORAL | 0 refills | Status: AC

## 2019-07-02 MED ORDER — DULOXETINE HCL 30 MG PO CPEP: 90.0000 mg | ORAL_CAPSULE | Freq: Every day | ORAL | 2 refills | Status: AC

## 2019-07-02 MED FILL — ARIPiprazole 2 MG TABS: 2 | 90 days supply | Qty: 90 | Fill #0

## 2019-07-02 NOTE — Progress Notes (Signed)
Virtual Visit via Telephone Note  I connected with Cassandra Morris on 07/02/19 at  4:00 PM EST by telephone and verified that I am speaking with the correct person using two identifiers.   I discussed the limitations, risks, security and privacy concerns of performing an evaluation and management service by telephone and the availability of in person appointments. I also discussed with the patient that there may be a patient responsible charge related to this service. The patient expressed understanding and agreed to proceed.   History of Present Illness: Patient was evaluated through phone session.  She is now taking Cymbalta 90 mg which was increased on her last visit.  She noticed much improvement in her anxiety and she does not feel as nervous and depressed.  Her energy level is improved.  She is also taking multiple vitamins including vitamin D and B12.  She feels these vitamins are also helping her energy level.  Her sleep is also improved.  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  [place].  She is tolerating Cymbalta without any side effects.  She has no tremors, shakes or any EPS.  She denies any feeling of hopelessness or worthlessness.  She like to keep her current medication.   Past Psychiatric History:Reviewed. H/Odepressionand taking medications since 30's.TriedLexapro, Effexor, Depakote and Lamictal.TookZoloft, Xanax and Abilify butran outwhen she switch provider.No H/Oinpatient treatment or any suicidal attempt.  Psychiatric Specialty Exam: Physical Exam  ROS  There were no vitals taken for this visit.There is no height or weight on file to calculate BMI.  General Appearance: NA  Eye Contact:  NA  Speech:  Clear and Coherent and Slow  Volume:  Normal  Mood:  Anxious  Affect:  NA  Thought Process:  Goal Directed  Orientation:  Full (Time, Place, and Person)  Thought  Content:  WDL  Suicidal Thoughts:  No  Homicidal Thoughts:  No  Memory:  Immediate;   Good Recent;   Good Remote;   Good  Judgement:  Good  Insight:  Good  Psychomotor Activity:  NA  Concentration:  Concentration: Good and Attention Span: Good  Recall:  Good  Fund of Knowledge:  Good  Language:  Good  Akathisia:  No  Handed:  Right  AIMS (if indicated):     Assets:  Communication Skills Desire for Improvement Housing Resilience Social Support Transportation  ADL's:  Intact  Cognition:  WNL  Sleep:   improved      Assessment and Plan: Major depressive disorder, recurrent.  Generalized anxiety disorder.  Patient doing much better since the dose increase Cymbalta to 90 mg.  She is also taking Abilify but is helping her.  She has no side effects.  Discussed medication side effects and benefits.  Recommended to call us back if she has any question or any concern.  Follow-up in 3 months.  Follow Up Instructions:    I discussed the assessment and treatment plan with the patient. The patient was provided an opportunity to ask questions and all were answered. The patient agreed with the plan and demonstrated an understanding of the instructions.   The patient was advised to call back or seek an in-person evaluation if the symptoms worsen or if the condition fails to improve as anticipated.  I provided 20 minutes of non-face-to-face time during this encounter.   Kathlee Nations, MD

## 2019-07-06 MED FILL — DULoxetine HCL 30 MG CPEP: 30 | 30 days supply | Qty: 90 | Fill #0

## 2019-09-16 ENCOUNTER — Encounter: Payer: 59 | Admitting: Internal Medicine

## 2019-09-16 DIAGNOSIS — E119 Type 2 diabetes mellitus without complications: Secondary | ICD-10-CM | POA: Diagnosis not present

## 2019-09-16 DIAGNOSIS — D3132 Benign neoplasm of left choroid: Secondary | ICD-10-CM | POA: Diagnosis not present

## 2019-09-16 DIAGNOSIS — H35373 Puckering of macula, bilateral: Secondary | ICD-10-CM | POA: Diagnosis not present

## 2019-09-16 DIAGNOSIS — Z961 Presence of intraocular lens: Secondary | ICD-10-CM | POA: Diagnosis not present

## 2019-09-16 LAB — HM DIABETES EYE EXAM

## 2019-09-21 ENCOUNTER — Other Ambulatory Visit: Payer: Self-pay

## 2019-09-21 ENCOUNTER — Other Ambulatory Visit (INDEPENDENT_AMBULATORY_CARE_PROVIDER_SITE_OTHER): Payer: 59

## 2019-09-21 ENCOUNTER — Encounter: Payer: Self-pay | Admitting: Internal Medicine

## 2019-09-21 ENCOUNTER — Ambulatory Visit (INDEPENDENT_AMBULATORY_CARE_PROVIDER_SITE_OTHER): Payer: 59 | Admitting: Internal Medicine

## 2019-09-21 VITALS — BP 120/76 | HR 82 | Temp 98.5°F | Ht 68.0 in | Wt 201.0 lb

## 2019-09-21 DIAGNOSIS — E1140 Type 2 diabetes mellitus with diabetic neuropathy, unspecified: Secondary | ICD-10-CM

## 2019-09-21 DIAGNOSIS — Z794 Long term (current) use of insulin: Secondary | ICD-10-CM

## 2019-09-21 DIAGNOSIS — Z0000 Encounter for general adult medical examination without abnormal findings: Secondary | ICD-10-CM

## 2019-09-21 LAB — CBC WITH DIFFERENTIAL/PLATELET
Basophils Absolute: 0.1 10*3/uL (ref 0.0–0.1)
Basophils Relative: 1.1 % (ref 0.0–3.0)
Eosinophils Absolute: 0.2 10*3/uL (ref 0.0–0.7)
Eosinophils Relative: 1.6 % (ref 0.0–5.0)
HCT: 42.4 % (ref 36.0–46.0)
Hemoglobin: 14 g/dL (ref 12.0–15.0)
Lymphocytes Relative: 32.4 % (ref 12.0–46.0)
Lymphs Abs: 3.2 10*3/uL (ref 0.7–4.0)
MCHC: 33 g/dL (ref 30.0–36.0)
MCV: 86.4 fl (ref 78.0–100.0)
Monocytes Absolute: 0.6 10*3/uL (ref 0.1–1.0)
Monocytes Relative: 6.2 % (ref 3.0–12.0)
Neutro Abs: 5.8 10*3/uL (ref 1.4–7.7)
Neutrophils Relative %: 58.7 % (ref 43.0–77.0)
Platelets: 241 10*3/uL (ref 150.0–400.0)
RBC: 4.91 Mil/uL (ref 3.87–5.11)
RDW: 12.9 % (ref 11.5–15.5)
WBC: 9.8 10*3/uL (ref 4.0–10.5)

## 2019-09-21 NOTE — Patient Instructions (Signed)
Please continue all other medications as before, and refills have been done if requested.  Please have the pharmacy call with any other refills you may need.  Please continue your efforts at being more active, low cholesterol diet, and weight control.  You are otherwise up to date with prevention measures today.  Please keep your appointments with your specialists as you may have planned  Please go to the LAB at the blood drawing area for the tests to be done at the [facility] today  You will be contacted by phone if any changes need to be made immediately.  Otherwise, you will receive a letter about your results with an explanation, but please check with MyChart first.  Please remember to sign up for MyChart if you have not done so, as this will be important to you in the future with finding out test results, communicating by private email, and scheduling acute appointments online when needed.  Please make an Appointment to return for your 1 year visit, or sooner if needed

## 2019-09-21 NOTE — Assessment & Plan Note (Signed)
For labs, f/u endo

## 2019-09-21 NOTE — Assessment & Plan Note (Signed)

## 2019-09-21 NOTE — Progress Notes (Signed)
Subjective:    Patient ID: Cassandra Morris, female    DOB: [birth year], 69 y.o.   MRN: [id number]  HPI    Here for wellness and f/u;  Overall doing ok;  Pt denies Chest pain, worsening SOB, DOE, wheezing, orthopnea, PND, worsening LE edema, palpitations, dizziness or syncope.  Pt denies neurological change such as new headache, facial or extremity weakness.  Pt denies polydipsia, polyuria, or low sugar symptoms. Pt states overall good compliance with treatment and medications, good tolerability, and has been trying to follow appropriate diet.  Pt denies worsening depressive symptoms, suicidal ideation or panic. No fever, night sweats, wt loss, loss of appetite, or other constitutional symptoms.  Pt states good ability with ADL's, has low fall risk, home safety reviewed and adequate, no other significant changes in hearing or vision, and only occasionally active with exercise.  Already had covid vaccine #2.  Retired since [employer]. No new complaints Pans to see endo soon. Past Medical History:  Diagnosis Date  . Depression   . Diabetes mellitus    T2DM  . Hyperlipidemia   . Hypertension   . Obesity (BMI 30-39.9)    Past Surgical History:  Procedure Laterality Date  . CHOLECYSTECTOMY N/A 11/05/2016   Procedure: LAPAROSCOPIC CHOLECYSTECTOMY;  Surgeon: Rolm Bookbinder, MD;  Location: [place];  Service: General;  Laterality: N/A;    reports that she quit smoking about 35 years ago. Her smoking use included cigarettes. She has a 2.25 pack-year smoking history. She has never used smokeless tobacco. She reports current alcohol use. She reports that she does not use drugs. family history includes ADD / ADHD in an other family member; Alcohol abuse in her father; Allergies in her mother; Anxiety disorder in her father; Asthma in her mother; Deep vein thrombosis in her mother; Dementia in her mother; Depression in her brother and father; Diabetes in her mother; Heart disease in her mother. Allergies    Allergen Reactions  . Trulicity [Dulaglutide] Nausea And Vomiting  . Metformin And Related Diarrhea   Current Outpatient Medications on File Prior to Visit  Medication Sig Dispense Refill  . ACCU-CHEK FASTCLIX LANCETS MISC Use to check sugar 3 times daily 300 each 5  . acetaminophen (TYLENOL) 325 MG tablet Take 2 tablets (650 mg total) by mouth every 6 (six) hours as needed for fever. 30 tablet 0  . acetaminophen-codeine (TYLENOL #3) 300-30 MG tablet Take 1-2 tablets by mouth every 6 (six) hours as needed for moderate pain. 15 tablet 0  . ARIPiprazole (ABILIFY) 2 MG tablet Take 1 tablet (2 mg total) by mouth daily. 90 tablet 0  . atorvastatin (LIPITOR) 40 MG tablet Take 1 tablet (40 mg total) by mouth daily. 90 tablet 3  . Blood Glucose Monitoring Suppl (ACCU-CHEK GUIDE) w/Device KIT 1 each by Does not apply route daily. 1 kit 0  . calcium carbonate (TUMS EX) 750 MG chewable tablet Chew 1-2 tablets by mouth daily as needed for heartburn (acid indigestion).    . Cholecalciferol (VITAMIN D3 PO) Take 2 capsules by mouth daily.    . Cyanocobalamin (VITAMIN B-12 SL) Place 1 tablet under the tongue daily.    . DULoxetine (CYMBALTA) 30 MG capsule Take 3 capsules (90 mg total) by mouth daily. 90 capsule 2  . glucose blood (ACCU-CHEK GUIDE) test strip Use as instructed to check sugar 3 times daily 300 each 5  . ibuprofen (ADVIL) 400 MG tablet Take 1 tablet (400 mg total) by mouth every 6 (six)  hours as needed. 30 tablet 0  . Insulin Degludec (TRESIBA FLEXTOUCH) 200 UNIT/ML SOPN Inject 80 Units into the skin daily. 6 pen 5  . Insulin Lispro (HUMALOG KWIKPEN) 200 UNIT/ML SOPN Inject 25-30 Units into the skin 3 (three) times daily before meals. 15 pen 3  . Insulin Pen Needle (UNIFINE PENTIPS) 32G X 4 MM MISC Use 4x a day 300 each 3  . Lancets Misc. (ACCU-CHEK FASTCLIX LANCET) KIT Use to check sugar 3 times daily 1 kit 0  . MAGNESIUM PO Take 1 tablet by mouth daily.    . polyvinyl alcohol (ARTIFICIAL  TEARS) 1.4 % ophthalmic solution Place 1 drop into both eyes daily as needed for dry eyes.     No current facility-administered medications on file prior to visit.   Review of Systems All otherwise neg per pt     Objective:   Physical Exam BP 120/76 (BP Location: Left Arm, Patient Position: Sitting, Cuff Size: Large)   Pulse 82   Temp 98.5 F (36.9 C) (Oral)   Ht _0  (1.727 m)   Wt 201 lb (91.2 kg)   SpO2 99%   BMI 30.56 kg/m  VS noted,  Constitutional: Pt appears in NAD HENT: Head: NCAT.  Right Ear: External ear normal.  Left Ear: External ear normal.  Eyes: . Pupils are equal, round, and reactive to light. Conjunctivae and EOM are normal Nose: without d/c or deformity Neck: Neck supple. Gross normal ROM Cardiovascular: Normal rate and regular rhythm.   Pulmonary/Chest: Effort normal and breath sounds without rales or wheezing.  Abd:  Soft, NT, ND, + BS, no organomegaly Neurological: Pt is alert. At baseline orientation, motor grossly intact Skin: Skin is warm. No rashes, other new lesions, no LE edema Psychiatric: Pt behavior is normal without agitation  All otherwise neg per pt Lab Results  Component Value Date   WBC 10.1 12/18/2017   HGB 14.8 12/18/2017   HCT 44.6 12/18/2017   PLT 255.0 12/18/2017   GLUCOSE 406 (H) 12/18/2017   CHOL 229 (H) 12/18/2017   TRIG 292.0 (H) 12/18/2017   HDL 40.90 12/18/2017   LDLDIRECT 157.0 12/18/2017   LDLCALC 61 07/16/2014   ALT 15 12/18/2017   AST 12 12/18/2017   NA 132 (L) 12/18/2017   K 4.5 12/18/2017   CL 96 12/18/2017   CREATININE 0.86 12/18/2017   BUN 13 12/18/2017   CO2 27 12/18/2017   TSH 3.80 12/18/2017   INR 1.21 11/04/2016   HGBA1C 10.5 (A) 08/25/2018   MICROALBUR <0.7 12/18/2017          Assessment & Plan:

## 2019-09-22 ENCOUNTER — Encounter: Payer: Self-pay | Admitting: Internal Medicine

## 2019-09-22 LAB — BASIC METABOLIC PANEL
BUN: 16 mg/dL (ref 6–23)
CO2: 28 mEq/L (ref 19–32)
Calcium: 8.4 mg/dL (ref 8.4–10.5)
Chloride: 99 mEq/L (ref 96–112)
Creatinine, Ser: 0.73 mg/dL (ref 0.40–1.20)
GFR: 79.16 mL/min (ref >60.00)
Glucose, Bld: 249 mg/dL — ABNORMAL HIGH (ref 70–99)
Potassium: 3.5 mEq/L (ref 3.5–5.1)
Sodium: 135 mEq/L (ref 135–145)

## 2019-09-22 LAB — MICROALBUMIN / CREATININE URINE RATIO
Creatinine,U: 80.2 mg/dL
Microalb Creat Ratio: 0.9 mg/g (ref 0.0–30.0)
Microalb, Ur: <0.7 mg/dL (ref 0.0–1.9)

## 2019-09-22 LAB — HEMOGLOBIN A1C: Hgb A1c MFr Bld: 13.1 % — ABNORMAL HIGH (ref 4.6–6.5)

## 2019-09-22 LAB — URINALYSIS, ROUTINE W REFLEX MICROSCOPIC
Bilirubin Urine: NEGATIVE
Hgb urine dipstick: NEGATIVE
Ketones, ur: NEGATIVE
Nitrite: NEGATIVE
RBC / HPF: NONE SEEN (ref 0–?)
Specific Gravity, Urine: 1.02 (ref 1.000–1.030)
Total Protein, Urine: NEGATIVE
Urine Glucose: NEGATIVE
Urobilinogen, UA: 0.2 (ref 0.0–1.0)
pH: 5.5 (ref 5.0–8.0)

## 2019-09-22 LAB — HEPATIC FUNCTION PANEL
ALT: 14 U/L (ref 0–35)
AST: 15 U/L (ref 0–37)
Albumin: 3.4 g/dL — ABNORMAL LOW (ref 3.5–5.2)
Alkaline Phosphatase: 74 U/L (ref 39–117)
Bilirubin, Direct: 0 mg/dL (ref 0.0–0.3)
Total Bilirubin: 0.4 mg/dL (ref 0.2–1.2)
Total Protein: 5.9 g/dL — ABNORMAL LOW (ref 6.0–8.3)

## 2019-09-22 LAB — LDL CHOLESTEROL, DIRECT: Direct LDL: 158 mg/dL

## 2019-09-22 LAB — LIPID PANEL
Cholesterol: 226 mg/dL — ABNORMAL HIGH (ref 0–200)
HDL: 35.1 mg/dL — ABNORMAL LOW (ref >39.00)
NonHDL: 190.47
Total CHOL/HDL Ratio: 6
Triglycerides: 259 mg/dL — ABNORMAL HIGH (ref 0.0–149.0)
VLDL: 51.8 mg/dL — ABNORMAL HIGH (ref 0.0–40.0)

## 2019-09-22 LAB — TSH: TSH: 3.49 u[IU]/mL (ref 0.35–4.50)

## 2019-09-23 ENCOUNTER — Other Ambulatory Visit: Payer: Self-pay | Admitting: Internal Medicine

## 2019-09-23 ENCOUNTER — Telehealth: Payer: Self-pay

## 2019-09-23 DIAGNOSIS — E119 Type 2 diabetes mellitus without complications: Secondary | ICD-10-CM

## 2019-09-23 MED ORDER — TRESIBA FLEXTOUCH 200 UNIT/ML ~~LOC~~ SOPN: 100.0000 [IU] | PEN_INJECTOR | Freq: Every day | SUBCUTANEOUS | 5 refills | Status: AC

## 2019-09-23 MED FILL — HUMALOG 200 UNITS/ML KWIKPE: 200 | 28 days supply | Qty: 12 | Fill #0

## 2019-09-23 NOTE — Telephone Encounter (Signed)
Please be aware that pt has significant psychiatric problems  Please remind pt that even if her insurance lapses, that she is eligible for traditional medicare at least (although many persons opt to sign up for a medicare advantage insurance like [employer] medicare, or [employer]).  Dr Cruzita Lederer does take traditional medicare insurance and there would be no copay for the visit, so I was hoping she would still accept the referral appt to Dr Cruzita Lederer

## 2019-09-23 NOTE — Telephone Encounter (Signed)
Called and informed her that Humalog script has been sent to the pharmacy, also informed her about the other insurance options.   Patient stated that she has to get Medicare D first then she is going to check in [employer]

## 2019-09-23 NOTE — Telephone Encounter (Signed)
Called patient and informed her of the message she stated that she currently have an endocrinologist Dr. Cruzita Lederer, but her insurance is set to laps by the end of the current month and she will no longer be able to see him until her insurance coverage is reinstated.   Patient also said that she will need a prescription for Humalog before the end of the month before her insurance coverage ends

## 2019-09-25 MED FILL — TRESIBA FLEXTOUCH 200 UNITS: 200 | 36 days supply | Qty: 18 | Fill #0

## 2019-10-05 ENCOUNTER — Ambulatory Visit (HOSPITAL_COMMUNITY): Payer: 59 | Admitting: Psychiatry

## 2020-09-21 ENCOUNTER — Ambulatory Visit: Payer: 59 | Admitting: Internal Medicine
